# Patient Record
Sex: Female | Born: 1935 | Race: Black or African American | Hispanic: No | Smoking: Never smoker
Health system: Southern US, Community
[De-identification: ages and names within clinical notes are randomized; demographics above are authoritative.]

## PROBLEM LIST (undated history)

## (undated) DIAGNOSIS — R42 Dizziness and giddiness: Secondary | ICD-10-CM

## (undated) DIAGNOSIS — M1990 Unspecified osteoarthritis, unspecified site: Secondary | ICD-10-CM

## (undated) DIAGNOSIS — Z972 Presence of dental prosthetic device (complete) (partial): Secondary | ICD-10-CM

## (undated) DIAGNOSIS — I10 Essential (primary) hypertension: Secondary | ICD-10-CM

## (undated) DIAGNOSIS — M5430 Sciatica, unspecified side: Secondary | ICD-10-CM

## (undated) DIAGNOSIS — R7303 Prediabetes: Secondary | ICD-10-CM

## (undated) HISTORY — PX: COLONOSCOPY: SHX174

## (undated) HISTORY — PX: OOPHORECTOMY: SHX86

## (undated) HISTORY — PX: ABDOMINAL HYSTERECTOMY: SHX81

## (undated) HISTORY — PX: APPENDECTOMY: SHX54

## (undated) HISTORY — PX: CHOLECYSTECTOMY: SHX55

## (undated) HISTORY — PX: TONSILLECTOMY: SUR1361

---

## 2005-05-23 ENCOUNTER — Emergency Department: Payer: Self-pay | Admitting: Emergency Medicine

## 2007-09-10 IMAGING — CT CT ABD-PELV W/O CM
1 of 2 series · 15 of 32 positions shown, 19 images · non-contrast
Comparison: none

REASON FOR EXAM: Abdominal pain. Stone study. [HOSPITAL]
COMMENTS:

PROCEDURE:     CT  - CT ABDOMEN AND PELVIS W[DATE] [DATE]
RESULT:
HISTORY: Abdominal pain.
COMPARISON STUDIES:  None.

[Series 2: stone · axial · 0.78mm/px · z∈[-422,-22]mm · 15 of 150 slices shown, 19 images]
[im 11/150  soft-tissue]
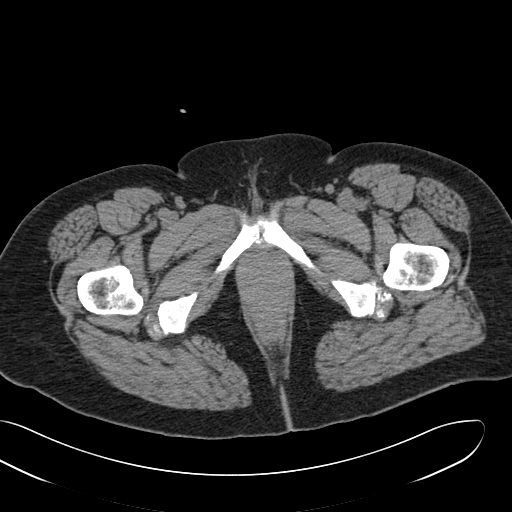
[im 11/150  bone]
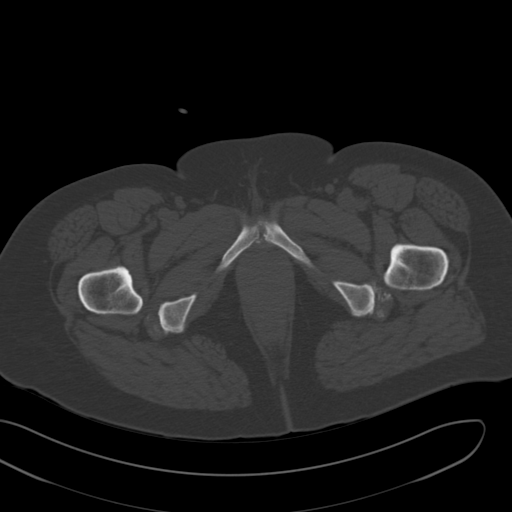
[im 22/150  soft-tissue]
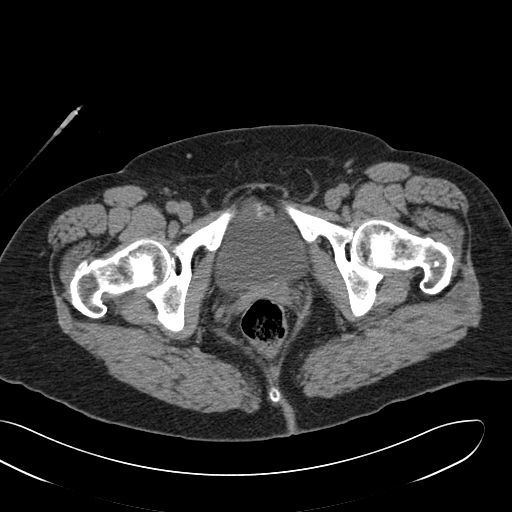
[im 32/150  soft-tissue]
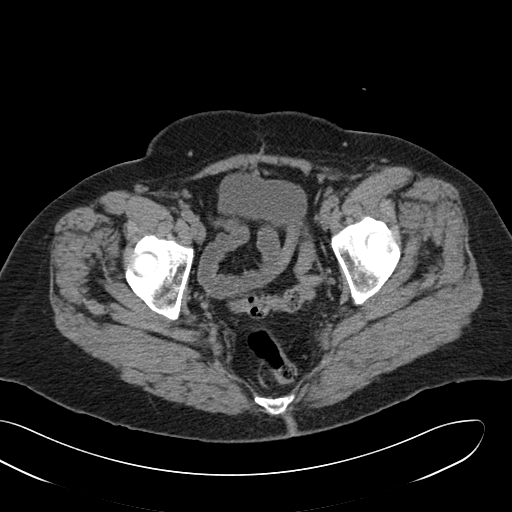
[im 43/150  soft-tissue]
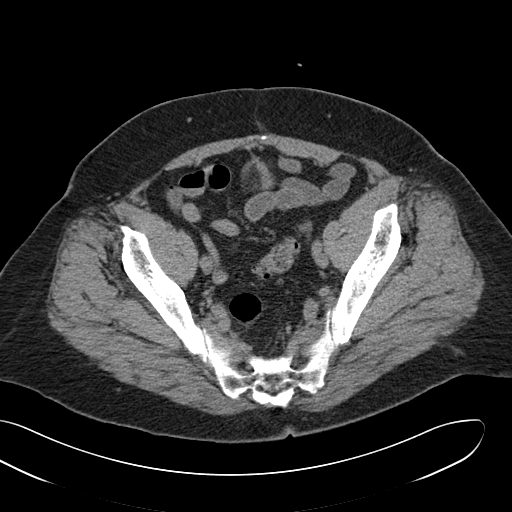
[im 54/150  soft-tissue]
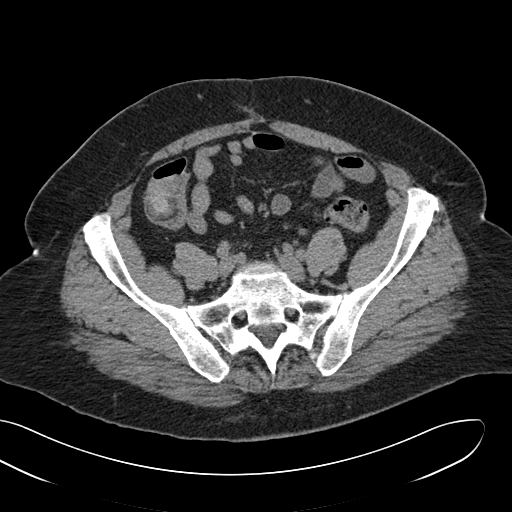
[im 64/150  soft-tissue]
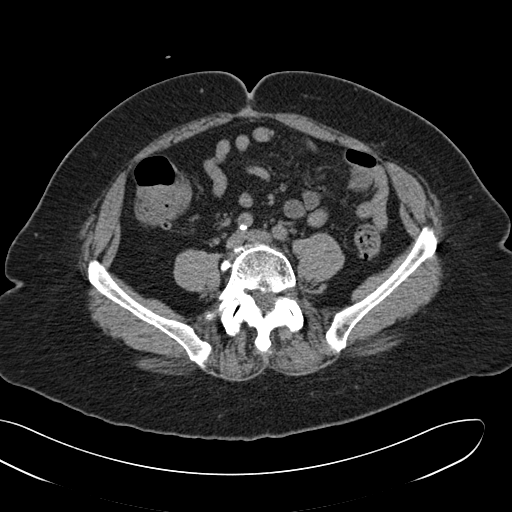
[im 75/150  soft-tissue]
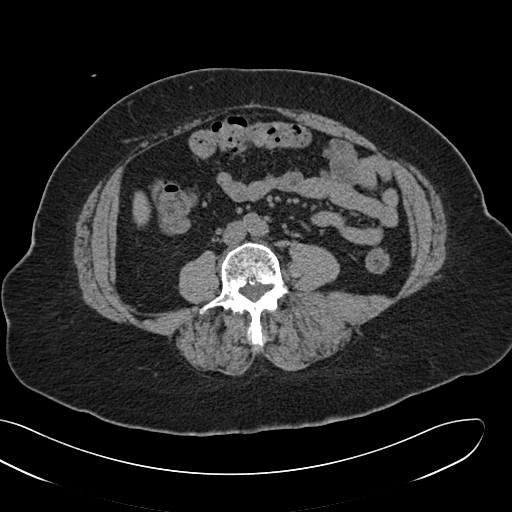
[im 86/150  soft-tissue]
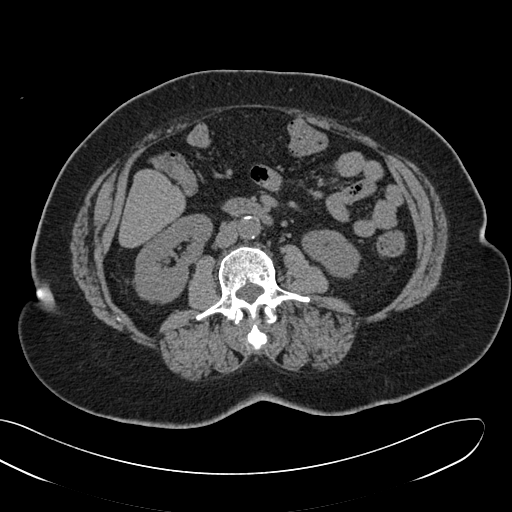
[im 96/150  soft-tissue]
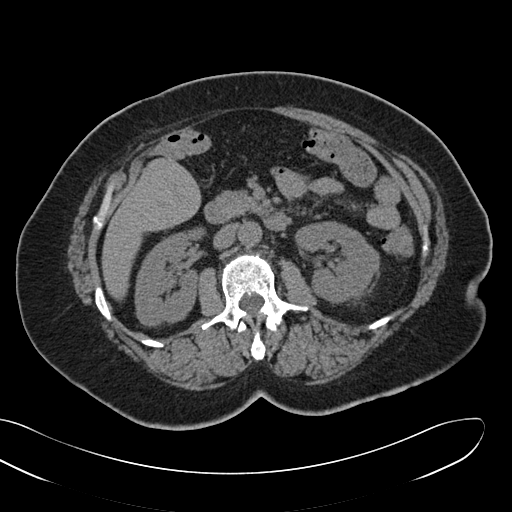
[im 96/150  bone]
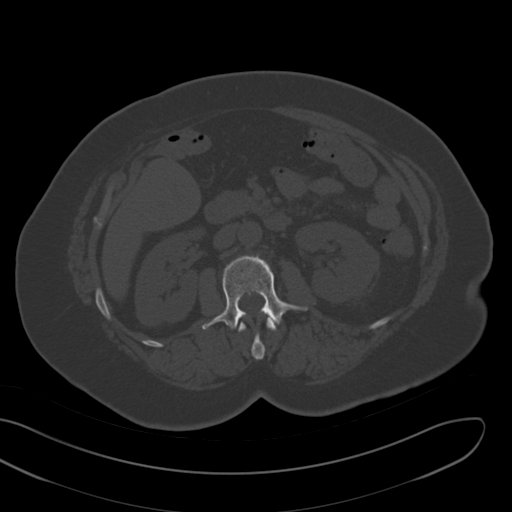
[im 107/150  soft-tissue]
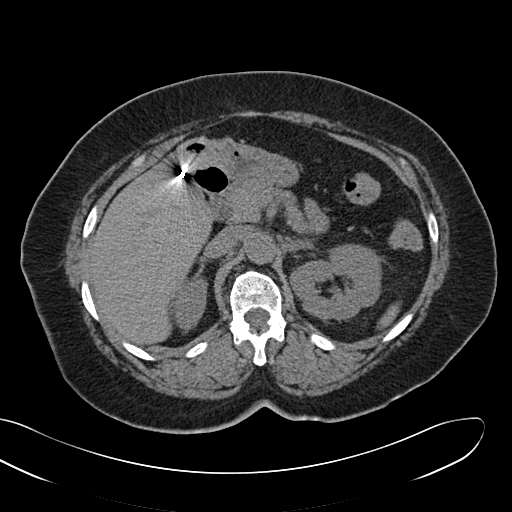
[im 118/150  soft-tissue]
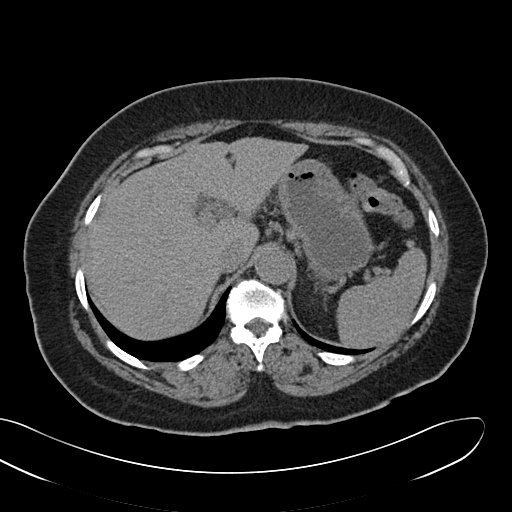
[im 128/150  soft-tissue]
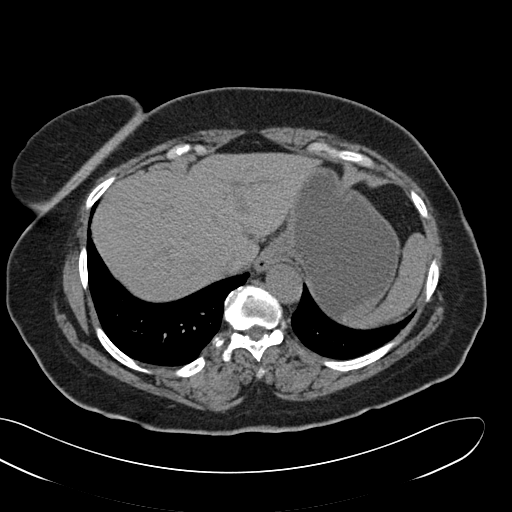
[im 128/150  lung]
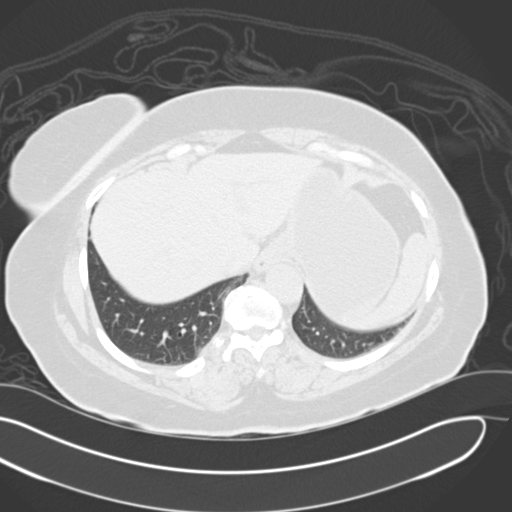
[im 134/150  lung]
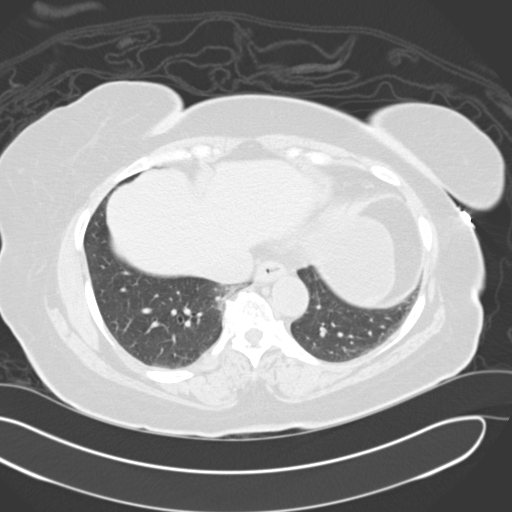
[im 139/150  soft-tissue]
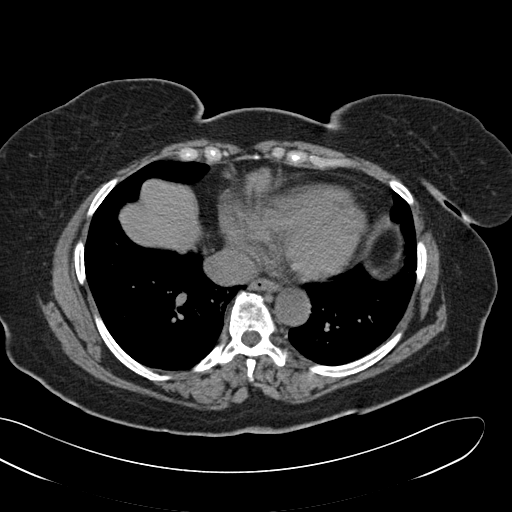
[im 139/150  lung]
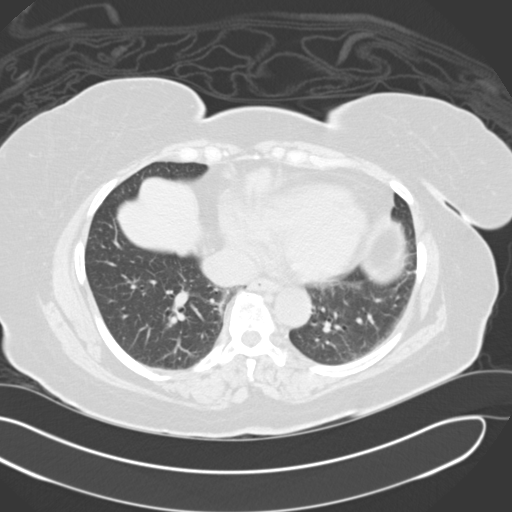
[im 144/150  lung]
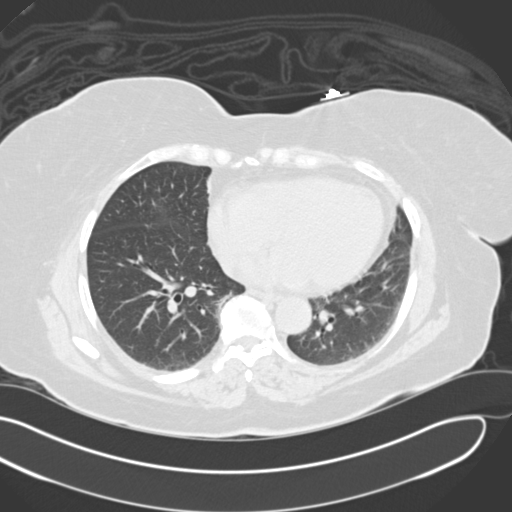

[15 of 32 positions shown; findings below may reference images not displayed]

FINDINGS: Liver and spleen are normal.  The adrenals are normal.  The
pancreas is normal.  Surgical clips are noted in the gallbladder fossa.  No
focal renal abnormalities are identified.  No bowel distention is noted.
Innumerable calcified pelvic densities are noted most consistent with
phleboliths.  A non-obstructing very tiny distal ureteral stone cannot be
excluded.  The bladder and inguinal regions are normal.  LEFT base
subsegmental atelectasis is noted.  Pelvic clips are noted.  No free fluid
is noted in the pelvis.

Soft tissue density is noted in the cecum. Although this could represent
stool, it may be useful to perform a barium enema or colonoscopy to rule out
a cecal mass.
IMPRESSION: No acute abnormalities identified.

Soft tissue density measuring approximately 2 cm noted in the cecum.
Although this could represent stool, it may be wise to perform barium enema
or colonoscopy to exclude a cecal mass.  The initial report was faxed to the
emergency room at the time of the study.  This report was phoned to the
emergency room physician at 8 a.m. on 05/24/05.

## 2009-06-20 ENCOUNTER — Emergency Department: Payer: Self-pay | Admitting: Emergency Medicine

## 2012-05-06 ENCOUNTER — Emergency Department: Payer: Self-pay | Admitting: Emergency Medicine

## 2012-07-07 ENCOUNTER — Ambulatory Visit: Payer: Self-pay | Admitting: General Practice

## 2015-06-06 ENCOUNTER — Other Ambulatory Visit: Payer: Self-pay

## 2015-11-04 ENCOUNTER — Encounter: Payer: Self-pay | Admitting: *Deleted

## 2015-11-04 NOTE — Discharge Instructions (Signed)

## 2015-11-09 ENCOUNTER — Ambulatory Visit: Payer: Medicare Other | Admitting: Student in an Organized Health Care Education/Training Program

## 2015-11-09 ENCOUNTER — Ambulatory Visit
Admission: RE | Admit: 2015-11-09 | Discharge: 2015-11-09 | Disposition: A | Discharge status: Home / Self Care | Payer: Medicare Other | Source: Ambulatory Visit | Attending: Ophthalmology | Admitting: Ophthalmology

## 2015-11-09 ENCOUNTER — Encounter
Admission: RE | Disposition: A | Discharge status: Home / Self Care | Payer: Self-pay | Source: Ambulatory Visit | Attending: Ophthalmology

## 2015-11-09 DIAGNOSIS — I10 Essential (primary) hypertension: Secondary | ICD-10-CM | POA: Insufficient documentation

## 2015-11-09 DIAGNOSIS — H2511 Age-related nuclear cataract, right eye: Secondary | ICD-10-CM | POA: Diagnosis present

## 2015-11-09 HISTORY — DX: Unspecified osteoarthritis, unspecified site: M19.90

## 2015-11-09 HISTORY — DX: Dizziness and giddiness: R42

## 2015-11-09 HISTORY — DX: Sciatica, unspecified side: M54.30

## 2015-11-09 HISTORY — PX: CATARACT EXTRACTION W/PHACO: SHX586

## 2015-11-09 HISTORY — DX: Essential (primary) hypertension: I10

## 2015-11-09 SURGERY — PHACOEMULSIFICATION, CATARACT, WITH IOL INSERTION
Anesthesia: Monitor Anesthesia Care | Site: Eye | Laterality: Right | Wound class: Clean

## 2015-11-09 MED ORDER — POVIDONE-IODINE 5 % OP SOLN
1.0000 "application " | OPHTHALMIC | Status: DC | PRN
  Administered 2015-11-09: 1 via OPHTHALMIC

## 2015-11-09 MED ORDER — LIDOCAINE HCL (PF) 4 % IJ SOLN
INTRAOCULAR | Status: DC | PRN
  Administered 2015-11-09: 1 mL via OPHTHALMIC

## 2015-11-09 MED ORDER — LACTATED RINGERS IV SOLN: INTRAVENOUS | Status: DC

## 2015-11-09 MED ORDER — NA HYALUR & NA CHOND-NA HYALUR 0.4-0.35 ML IO KIT
PACK | INTRAOCULAR | Status: DC | PRN
  Administered 2015-11-09: 1 mL via INTRAOCULAR

## 2015-11-09 MED ORDER — TIMOLOL MALEATE 0.5 % OP SOLN
OPHTHALMIC | Status: DC | PRN
  Administered 2015-11-09: 1 [drp] via OPHTHALMIC

## 2015-11-09 MED ORDER — EPINEPHRINE HCL 1 MG/ML IJ SOLN
INTRAMUSCULAR | Status: DC | PRN
  Administered 2015-11-09: 10:00:00 via OPHTHALMIC
  Administered 2015-11-09: 58 mL via OPHTHALMIC

## 2015-11-09 MED ORDER — FENTANYL CITRATE (PF) 100 MCG/2ML IJ SOLN
INTRAMUSCULAR | Status: DC | PRN
  Administered 2015-11-09: 50 ug via INTRAVENOUS

## 2015-11-09 MED ORDER — TETRACAINE HCL 0.5 % OP SOLN
1.0000 [drp] | OPHTHALMIC | Status: DC | PRN
  Administered 2015-11-09: 1 [drp] via OPHTHALMIC

## 2015-11-09 MED ORDER — CEFUROXIME OPHTHALMIC INJECTION 1 MG/0.1 ML
INJECTION | OPHTHALMIC | Status: DC | PRN
  Administered 2015-11-09: 0.1 mL via INTRACAMERAL

## 2015-11-09 MED ORDER — MIDAZOLAM HCL 2 MG/2ML IJ SOLN
INTRAMUSCULAR | Status: DC | PRN
  Administered 2015-11-09: 2 mg via INTRAVENOUS

## 2015-11-09 MED ORDER — BRIMONIDINE TARTRATE 0.2 % OP SOLN
OPHTHALMIC | Status: DC | PRN
  Administered 2015-11-09: 1 [drp] via OPHTHALMIC

## 2015-11-09 MED ORDER — ARMC OPHTHALMIC DILATING GEL
1.0000 "application " | OPHTHALMIC | Status: DC | PRN
  Administered 2015-11-09 (×2): 1 via OPHTHALMIC

## 2015-11-09 MED ORDER — LACTATED RINGERS IV SOLN: 500.0000 mL | INTRAVENOUS | Status: DC

## 2015-11-09 SURGICAL SUPPLY — 25 items
CANNULA ANT/CHMB 27GA (MISCELLANEOUS) ×3 IMPLANT
CARTRIDGE ABBOTT (MISCELLANEOUS) IMPLANT
GLOVE SURG LX 7.5 STRW (GLOVE) ×2
GLOVE SURG LX STRL 7.5 STRW (GLOVE) ×1 IMPLANT
GLOVE SURG TRIUMPH 8.0 PF LTX (GLOVE) ×3 IMPLANT
GOWN STRL REUS W/ TWL LRG LVL3 (GOWN DISPOSABLE) ×2 IMPLANT
GOWN STRL REUS W/TWL LRG LVL3 (GOWN DISPOSABLE) ×4
LENS IOL TECNIS 5.0 (Intraocular Lens) ×3 IMPLANT
MARKER SKIN DUAL TIP RULER LAB (MISCELLANEOUS) ×3 IMPLANT
NDL RETROBULBAR .5 NSTRL (NEEDLE) IMPLANT
NEEDLE FILTER BLUNT 18X 1/2SAF (NEEDLE) ×2
NEEDLE FILTER BLUNT 18X1 1/2 (NEEDLE) ×1 IMPLANT
PACK CATARACT BRASINGTON (MISCELLANEOUS) ×3 IMPLANT
PACK EYE AFTER SURG (MISCELLANEOUS) ×3 IMPLANT
PACK OPTHALMIC (MISCELLANEOUS) ×3 IMPLANT
RING MALYGIN 7.0 (MISCELLANEOUS) IMPLANT
SUT ETHILON 10-0 CS-B-6CS-B-6 (SUTURE)
SUT VICRYL  9 0 (SUTURE)
SUT VICRYL 9 0 (SUTURE) IMPLANT
SUTURE EHLN 10-0 CS-B-6CS-B-6 (SUTURE) IMPLANT
SYR 3ML LL SCALE MARK (SYRINGE) ×3 IMPLANT
SYR 5ML LL (SYRINGE) ×3 IMPLANT
SYR TB 1ML LUER SLIP (SYRINGE) ×3 IMPLANT
WATER STERILE IRR 250ML POUR (IV SOLUTION) ×3 IMPLANT
WIPE NON LINTING 3.25X3.25 (MISCELLANEOUS) ×3 IMPLANT

## 2015-11-09 NOTE — H&P (Signed)
  The History and Physical notes are on paper, have been signed, and are to be scanned. The patient remains stable and unchanged from the H&P.   Previous H&P reviewed, patient examined, and there are no changes.  Cyan Clippinger 11/09/2015 8:51 AM

## 2015-11-09 NOTE — Transfer of Care (Signed)
Immediate Anesthesia Transfer of Care Note  Patient: Tammy Hendricks  Procedure(s) Performed: Procedure(s): CATARACT EXTRACTION PHACO AND INTRAOCULAR LENS PLACEMENT (IOC) right eye (Right)  Patient Location: PACU  Anesthesia Type: MAC  Level of Consciousness: awake, alert  and patient cooperative  Airway and Oxygen Therapy: Patient Spontanous Breathing and Patient connected to supplemental oxygen  Post-op Assessment: Post-op Vital signs reviewed, Patient's Cardiovascular Status Stable, Respiratory Function Stable, Patent Airway and No signs of Nausea or vomiting  Post-op Vital Signs: Reviewed and stable  Complications: No apparent anesthesia complications

## 2015-11-09 NOTE — Anesthesia Preprocedure Evaluation (Addendum)
Anesthesia Evaluation  Patient identified by MRN, date of birth, ID band Patient awake    Reviewed: Allergy & Precautions, H&P , NPO status , Patient's Chart, lab work & pertinent test results, reviewed documented beta blocker date and time   Airway Mallampati: III  TM Distance: >3 FB Neck ROM: full    Dental  (+) Upper Dentures, Lower Dentures   Pulmonary neg pulmonary ROS,    Pulmonary exam normal breath sounds clear to auscultation       Cardiovascular Exercise Tolerance: Good hypertension, On Medications  Rhythm:regular Rate:Normal     Neuro/Psych negative neurological ROS  negative psych ROS   GI/Hepatic negative GI ROS, Neg liver ROS,   Endo/Other  negative endocrine ROS  Renal/GU negative Renal ROS  negative genitourinary   Musculoskeletal   Abdominal   Peds  Hematology negative hematology ROS (+)   Anesthesia Other Findings   Reproductive/Obstetrics negative OB ROS                            Anesthesia Physical Anesthesia Plan  ASA: II  Anesthesia Plan: MAC   Post-op Pain Management:    Induction:   Airway Management Planned:   Additional Equipment:   Intra-op Plan:   Post-operative Plan:   Informed Consent: I have reviewed the patients History and Physical, chart, labs and discussed the procedure including the risks, benefits and alternatives for the proposed anesthesia with the patient or authorized representative who has indicated his/her understanding and acceptance.     Plan Discussed with: CRNA  Anesthesia Plan Comments:         Anesthesia Quick Evaluation

## 2015-11-09 NOTE — Op Note (Signed)
LOCATION:  [facility]   PREOPERATIVE DIAGNOSIS:  Nuclear sclerotic cataract of the right eye.  H25.11   POSTOPERATIVE DIAGNOSIS:  Nuclear sclerotic cataract of the right eye.   PROCEDURE:  Phacoemulsification with Toric posterior chamber intraocular lens placement of the right eye.   LENS:   Implant Name Type Inv. Item Serial No. Manufacturer Lot No. LRB No. Used  LENS IOL TECNIS 5.0 - [id number] Intraocular Lens LENS IOL TECNIS 5.0 [id number] AMO   Right 1     ZCT150 21.5 D  Toric intraocular lens with 1.5 diopters of cylindrical power with axis orientation at 160 degrees.   ULTRASOUND TIME: 20 % of 1 minutes, 9 seconds.  CDE 13.5   SURGEON:  Deirdre Evenerhadwick R. Jameshia Hayashida, MD   ANESTHESIA: Topical with tetracaine drops and 2% Xylocaine jelly, augmented with 1% preservative-free intracameral lidocaine. .   COMPLICATIONS:  None.   DESCRIPTION OF PROCEDURE:  The patient was identified in the holding room and transported to the operating suite and placed in the supine position under the operating microscope.  The right eye was identified as the operative eye, and it was prepped and draped in the usual sterile ophthalmic fashion.    A clear-corneal paracentesis incision was made at the 12:00 position.  0.5 ml of preservative-free 1% lidocaine was injected into the anterior chamber. The anterior chamber was filled with Viscoat.  A 2.4 millimeter near clear corneal incision was then made at the 9:00 position.  A cystotome and capsulorrhexis forceps were then used to make a curvilinear capsulorrhexis.  Hydrodissection and hydrodelineation were then performed using balanced salt solution.   Phacoemulsification was then used in stop and chop fashion to remove the lens, nucleus and epinucleus.  The remaining cortex was aspirated using the irrigation and aspiration handpiece.  Provisc viscoelastic was then placed into the capsular bag to distend it for lens placement.  The Verion digital marker  was used to align the implant at the intended axis.   A Toric lens was then injected into the capsular bag.  It was rotated clockwise until the axis marks on the lens were approximately 15 degrees in the counterclockwise direction to the intended alignment.  The viscoelastic was aspirated from the eye using the irrigation aspiration handpiece.  Then, a Koch spatula through the sideport incision was used to rotate the lens in a clockwise direction until the axis markings of the intraocular lens were lined up with the Verion alignment.  Balanced salt solution was then used to hydrate the wounds. Cefuroxime 0.1 ml of a 10mg /ml solution was injected into the anterior chamber for a dose of 1 mg of intracameral antibiotic at the completion of the case.    The eye was noted to have a physiologic pressure and there was no wound leak noted.   Timolol and Brimonidine drops were applied to the eye.  The patient was taken to the recovery room in stable condition having had no complications of anesthesia or surgery.  Tammy Hendricks 11/09/2015, 9:42 AM

## 2015-11-09 NOTE — Anesthesia Postprocedure Evaluation (Signed)
Anesthesia Post Note  Patient: Tammy Hendricks  Procedure(s) Performed: Procedure(s) (LRB): CATARACT EXTRACTION PHACO AND INTRAOCULAR LENS PLACEMENT (IOC) right eye (Right)  Patient location during evaluation: PACU Anesthesia Type: MAC Level of consciousness: awake and alert Pain management: pain level controlled Vital Signs Assessment: post-procedure vital signs reviewed and stable Respiratory status: spontaneous breathing, nonlabored ventilation and respiratory function stable Cardiovascular status: blood pressure returned to baseline and stable Postop Assessment: no signs of nausea or vomiting Anesthetic complications: no    DANIEL D KOVACS

## 2015-11-09 NOTE — Anesthesia Procedure Notes (Signed)
Procedure Name: MAC Performed by: Zackarey Holleman Pre-anesthesia Checklist: Patient identified, Emergency Drugs available, Suction available, Timeout performed and Patient being monitored Patient Re-evaluated:Patient Re-evaluated prior to inductionOxygen Delivery Method: Nasal cannula Placement Confirmation: positive ETCO2       

## 2015-11-10 ENCOUNTER — Encounter: Payer: Self-pay | Admitting: Ophthalmology

## 2016-02-03 ENCOUNTER — Encounter: Payer: Self-pay | Admitting: Emergency Medicine

## 2016-02-03 ENCOUNTER — Emergency Department
Admission: EM | Admit: 2016-02-03 | Discharge: 2016-02-06 | Disposition: A | Discharge status: Home / Self Care | Payer: Medicare Other | Attending: Emergency Medicine | Admitting: Emergency Medicine

## 2016-02-03 DIAGNOSIS — Z5321 Procedure and treatment not carried out due to patient leaving prior to being seen by health care provider: Secondary | ICD-10-CM | POA: Insufficient documentation

## 2016-02-03 DIAGNOSIS — R200 Anesthesia of skin: Secondary | ICD-10-CM | POA: Diagnosis not present

## 2016-02-03 DIAGNOSIS — Z79899 Other long term (current) drug therapy: Secondary | ICD-10-CM | POA: Diagnosis not present

## 2016-02-03 DIAGNOSIS — Z7982 Long term (current) use of aspirin: Secondary | ICD-10-CM | POA: Insufficient documentation

## 2016-02-03 DIAGNOSIS — I10 Essential (primary) hypertension: Secondary | ICD-10-CM | POA: Diagnosis not present

## 2016-02-03 NOTE — ED Triage Notes (Signed)
Pt reports left leg numbness since Wednesday.

## 2017-11-21 ENCOUNTER — Encounter: Payer: Self-pay | Admitting: *Deleted

## 2017-11-21 ENCOUNTER — Other Ambulatory Visit: Payer: Self-pay

## 2017-11-29 NOTE — Discharge Instructions (Signed)

## 2017-12-03 ENCOUNTER — Ambulatory Visit: Payer: Medicare Other | Admitting: Anesthesiology

## 2017-12-03 ENCOUNTER — Ambulatory Visit
Admission: RE | Admit: 2017-12-03 | Discharge: 2017-12-03 | Disposition: A | Discharge status: Home / Self Care | Payer: Medicare Other | Source: Ambulatory Visit | Attending: Ophthalmology | Admitting: Ophthalmology

## 2017-12-03 ENCOUNTER — Encounter
Admission: RE | Disposition: A | Discharge status: Home / Self Care | Payer: Self-pay | Source: Ambulatory Visit | Attending: Ophthalmology

## 2017-12-03 DIAGNOSIS — E1136 Type 2 diabetes mellitus with diabetic cataract: Secondary | ICD-10-CM | POA: Insufficient documentation

## 2017-12-03 DIAGNOSIS — I10 Essential (primary) hypertension: Secondary | ICD-10-CM | POA: Diagnosis not present

## 2017-12-03 DIAGNOSIS — M1990 Unspecified osteoarthritis, unspecified site: Secondary | ICD-10-CM | POA: Diagnosis not present

## 2017-12-03 DIAGNOSIS — H2512 Age-related nuclear cataract, left eye: Secondary | ICD-10-CM | POA: Insufficient documentation

## 2017-12-03 HISTORY — PX: CATARACT EXTRACTION W/PHACO: SHX586

## 2017-12-03 HISTORY — DX: Prediabetes: R73.03

## 2017-12-03 HISTORY — DX: Presence of dental prosthetic device (complete) (partial): Z97.2

## 2017-12-03 SURGERY — PHACOEMULSIFICATION, CATARACT, WITH IOL INSERTION
Anesthesia: Monitor Anesthesia Care | Site: Eye | Laterality: Left | Wound class: "Clean "

## 2017-12-03 MED ORDER — LACTATED RINGERS IV SOLN: INTRAVENOUS | Status: DC

## 2017-12-03 MED ORDER — FENTANYL CITRATE (PF) 100 MCG/2ML IJ SOLN
INTRAMUSCULAR | Status: DC | PRN
  Administered 2017-12-03: 25 ug via INTRAVENOUS

## 2017-12-03 MED ORDER — MIDAZOLAM HCL 2 MG/2ML IJ SOLN
INTRAMUSCULAR | Status: DC | PRN
  Administered 2017-12-03: 1 mg via INTRAVENOUS

## 2017-12-03 MED ORDER — LIDOCAINE HCL (PF) 2 % IJ SOLN
INTRAMUSCULAR | Status: DC | PRN
  Administered 2017-12-03: 1 mL

## 2017-12-03 MED ORDER — CEFUROXIME OPHTHALMIC INJECTION 1 MG/0.1 ML
INJECTION | OPHTHALMIC | Status: DC | PRN
  Administered 2017-12-03: 0.1 mL via OPHTHALMIC

## 2017-12-03 MED ORDER — BRIMONIDINE TARTRATE-TIMOLOL 0.2-0.5 % OP SOLN
OPHTHALMIC | Status: DC | PRN
  Administered 2017-12-03: 1 [drp] via OPHTHALMIC

## 2017-12-03 MED ORDER — NA HYALUR & NA CHOND-NA HYALUR 0.4-0.35 ML IO KIT
PACK | INTRAOCULAR | Status: DC | PRN
  Administered 2017-12-03: 1 mL via INTRAOCULAR

## 2017-12-03 MED ORDER — EPINEPHRINE PF 1 MG/ML IJ SOLN
INTRAOCULAR | Status: DC | PRN
  Administered 2017-12-03: 70 mL via OPHTHALMIC

## 2017-12-03 MED ORDER — ARMC OPHTHALMIC DILATING DROPS
1.0000 "application " | OPHTHALMIC | Status: DC | PRN
  Administered 2017-12-03 (×3): 1 via OPHTHALMIC

## 2017-12-03 MED ORDER — MOXIFLOXACIN HCL 0.5 % OP SOLN
1.0000 [drp] | OPHTHALMIC | Status: DC | PRN
  Administered 2017-12-03 (×3): 1 [drp] via OPHTHALMIC

## 2017-12-03 SURGICAL SUPPLY — 27 items
CANNULA ANT/CHMB 27G (MISCELLANEOUS) ×1 IMPLANT
CANNULA ANT/CHMB 27GA (MISCELLANEOUS) ×3 IMPLANT
CARTRIDGE ABBOTT (MISCELLANEOUS) IMPLANT
GLOVE SURG LX 7.5 STRW (GLOVE) ×2
GLOVE SURG LX STRL 7.5 STRW (GLOVE) ×1 IMPLANT
GLOVE SURG TRIUMPH 8.0 PF LTX (GLOVE) ×3 IMPLANT
GOWN STRL REUS W/ TWL LRG LVL3 (GOWN DISPOSABLE) ×2 IMPLANT
GOWN STRL REUS W/TWL LRG LVL3 (GOWN DISPOSABLE) ×4
LENS IOL TECNIS ITEC 21.5 (Intraocular Lens) ×2 IMPLANT
MARKER SKIN DUAL TIP RULER LAB (MISCELLANEOUS) ×3 IMPLANT
NDL FILTER BLUNT 18X1 1/2 (NEEDLE) ×1 IMPLANT
NDL RETROBULBAR .5 NSTRL (NEEDLE) IMPLANT
NEEDLE FILTER BLUNT 18X 1/2SAF (NEEDLE) ×2
NEEDLE FILTER BLUNT 18X1 1/2 (NEEDLE) ×1 IMPLANT
PACK CATARACT BRASINGTON (MISCELLANEOUS) ×3 IMPLANT
PACK EYE AFTER SURG (MISCELLANEOUS) ×3 IMPLANT
PACK OPTHALMIC (MISCELLANEOUS) ×3 IMPLANT
RING MALYGIN 7.0 (MISCELLANEOUS) IMPLANT
SUT ETHILON 10-0 CS-B-6CS-B-6 (SUTURE)
SUT VICRYL  9 0 (SUTURE)
SUT VICRYL 9 0 (SUTURE) IMPLANT
SUTURE EHLN 10-0 CS-B-6CS-B-6 (SUTURE) IMPLANT
SYR 3ML LL SCALE MARK (SYRINGE) ×3 IMPLANT
SYR 5ML LL (SYRINGE) ×3 IMPLANT
SYR TB 1ML LUER SLIP (SYRINGE) ×3 IMPLANT
WATER STERILE IRR 500ML POUR (IV SOLUTION) ×3 IMPLANT
WIPE NON LINTING 3.25X3.25 (MISCELLANEOUS) ×3 IMPLANT

## 2017-12-03 NOTE — H&P (Signed)
The History and Physical notes are on paper, have been signed, and are to be scanned. The patient remains stable and unchanged from the H&P.   Previous H&P reviewed, patient examined, and there are no changes.  Tammy Hendricks 12/03/2017 8:10 AM

## 2017-12-03 NOTE — Op Note (Signed)
OPERATIVE NOTE  Tammy LoseShirley M Hendricks [id number] 12/03/2017   PREOPERATIVE DIAGNOSIS:  Nuclear sclerotic cataract left eye. H25.12   POSTOPERATIVE DIAGNOSIS:    Nuclear sclerotic cataract left eye.     PROCEDURE:  Phacoemusification with posterior chamber intraocular lens placement of the left eye   LENS:   Implant Name Type Inv. Item Serial No. Manufacturer Lot No. LRB No. Used  LENS IOL DIOP 21.5 - [id number] Intraocular Lens LENS IOL DIOP 21.5 [id number]  Left 1        ULTRASOUND TIME: 15  % of 1 minutes 6 seconds, CDE 10.0  SURGEON:  Deirdre Evenerhadwick R. Kameren Baade, MD   ANESTHESIA:  Topical with tetracaine drops and 2% Xylocaine jelly, augmented with 1% preservative-free intracameral lidocaine.    COMPLICATIONS:  None.   DESCRIPTION OF PROCEDURE:  The patient was identified in the holding room and transported to the operating room and placed in the supine position under the operating microscope.  The left eye was identified as the operative eye and it was prepped and draped in the usual sterile ophthalmic fashion.   A 1 millimeter clear-corneal paracentesis was made at the 1:30 position.  0.5 ml of preservative-free 1% lidocaine was injected into the anterior chamber.  The anterior chamber was filled with Viscoat viscoelastic.  A 2.4 millimeter keratome was used to make a near-clear corneal incision at the 10:30 position.  .  A curvilinear capsulorrhexis was made with a cystotome and capsulorrhexis forceps.  Balanced salt solution was used to hydrodissect and hydrodelineate the nucleus.   Phacoemulsification was then used in stop and chop fashion to remove the lens nucleus and epinucleus.  The remaining cortex was then removed using the irrigation and aspiration handpiece. Provisc was then placed into the capsular bag to distend it for lens placement.  A lens was then injected into the capsular bag.  The remaining viscoelastic was aspirated.   Wounds were hydrated with balanced salt  solution.  The anterior chamber was inflated to a physiologic pressure with balanced salt solution.  No wound leaks were noted. Cefuroxime 0.1 ml of a 10mg /ml solution was injected into the anterior chamber for a dose of 1 mg of intracameral antibiotic at the completion of the case.   Timolol and Brimonidine drops were applied to the eye.  The patient was taken to the recovery room in stable condition without complications of anesthesia or surgery.  Hughie Melroy 12/03/2017, 9:01 AM

## 2017-12-03 NOTE — Anesthesia Postprocedure Evaluation (Signed)
Anesthesia Post Note  Patient: Tammy Hendricks  Procedure(s) Performed: CATARACT EXTRACTION PHACO AND INTRAOCULAR LENS PLACEMENT (IOC) LEFT EYE (Left Eye)  Patient location during evaluation: PACU Anesthesia Type: MAC Level of consciousness: awake and alert Pain management: pain level controlled Vital Signs Assessment: post-procedure vital signs reviewed and stable Respiratory status: spontaneous breathing, nonlabored ventilation, respiratory function stable and patient connected to nasal cannula oxygen Cardiovascular status: stable and blood pressure returned to baseline Postop Assessment: no apparent nausea or vomiting Anesthetic complications: no    Geniya Fulgham C

## 2017-12-03 NOTE — Transfer of Care (Signed)
Immediate Anesthesia Transfer of Care Note  Patient: Tammy Hendricks  Procedure(s) Performed: CATARACT EXTRACTION PHACO AND INTRAOCULAR LENS PLACEMENT (IOC) LEFT EYE (Left Eye)  Patient Location: PACU  Anesthesia Type: MAC  Level of Consciousness: awake, alert  and patient cooperative  Airway and Oxygen Therapy: Patient Spontanous Breathing and Patient connected to supplemental oxygen  Post-op Assessment: Post-op Vital signs reviewed, Patient's Cardiovascular Status Stable, Respiratory Function Stable, Patent Airway and No signs of Nausea or vomiting  Post-op Vital Signs: Reviewed and stable  Complications: No apparent anesthesia complications

## 2017-12-03 NOTE — Anesthesia Preprocedure Evaluation (Signed)
Anesthesia Evaluation  Patient identified by MRN, date of birth, ID band Patient awake    Reviewed: Allergy & Precautions, NPO status , Patient's Chart, lab work & pertinent test results  Airway Mallampati: II  TM Distance: >3 FB Neck ROM: Full    Dental no notable dental hx.    Pulmonary neg pulmonary ROS,    Pulmonary exam normal breath sounds clear to auscultation       Cardiovascular hypertension, Normal cardiovascular exam Rhythm:Regular Rate:Normal     Neuro/Psych negative neurological ROS  negative psych ROS   GI/Hepatic negative GI ROS, Neg liver ROS,   Endo/Other  negative endocrine ROS  Renal/GU negative Renal ROS  negative genitourinary   Musculoskeletal  (+) Arthritis ,   Abdominal   Peds negative pediatric ROS (+)  Hematology negative hematology ROS (+)   Anesthesia Other Findings   Reproductive/Obstetrics negative OB ROS                             Anesthesia Physical Anesthesia Plan  ASA: II  Anesthesia Plan: MAC   Post-op Pain Management:    Induction: Intravenous  PONV Risk Score and Plan:   Airway Management Planned: Natural Airway  Additional Equipment:   Intra-op Plan:   Post-operative Plan: Extubation in OR  Informed Consent: I have reviewed the patients History and Physical, chart, labs and discussed the procedure including the risks, benefits and alternatives for the proposed anesthesia with the patient or authorized representative who has indicated his/her understanding and acceptance.   Dental advisory given  Plan Discussed with: CRNA  Anesthesia Plan Comments:         Anesthesia Quick Evaluation

## 2017-12-04 ENCOUNTER — Encounter: Payer: Self-pay | Admitting: Ophthalmology

## 2020-03-19 ENCOUNTER — Other Ambulatory Visit: Payer: Self-pay

## 2020-03-19 ENCOUNTER — Ambulatory Visit
Admission: EM | Admit: 2020-03-19 | Discharge: 2020-03-19 | Disposition: A | Discharge status: Home / Self Care | Payer: Medicare Other

## 2020-03-19 ENCOUNTER — Encounter: Payer: Self-pay | Admitting: Emergency Medicine

## 2020-03-19 DIAGNOSIS — I10 Essential (primary) hypertension: Secondary | ICD-10-CM

## 2020-03-19 DIAGNOSIS — T50905A Adverse effect of unspecified drugs, medicaments and biological substances, initial encounter: Secondary | ICD-10-CM

## 2020-03-19 MED ORDER — AMLODIPINE BESYLATE 2.5 MG PO TABS: 2.5000 mg | ORAL_TABLET | Freq: Every day | ORAL | 0 refills | Status: DC

## 2020-03-19 MED ORDER — HYDROCHLOROTHIAZIDE 25 MG PO TABS: 25.0000 mg | ORAL_TABLET | Freq: Every day | ORAL | Status: DC

## 2020-03-19 NOTE — ED Triage Notes (Signed)
Patient states she was started on Lisinpril-HCTZ on 03/04/20.  Patient states that she was previously just on HCTZ but her PCP changed her medication at her last visit.  Patient states that since starting the medication her blood pressure has been running low and has been off and on dizzy and light headed feeling.

## 2020-03-19 NOTE — Discharge Instructions (Addendum)
You were seen for dizziness and high blood pressure and are being treated for the same.   Take your prescribed medications as directed.  Make sure you follow-up with your primary care provider on December 6 as scheduled.  Take care, Dr. Sharlet Salina, NP-c

## 2020-03-19 NOTE — ED Provider Notes (Signed)
[facility] - [place], [place]   Name: Tammy Hendricks DOB: 05-27-1935 MRN: [id number] CSN: [id number] PCP: Floreen Comber, MD  Arrival date and time:  03/19/20 1335  Chief Complaint:  Dizziness   NOTE: Prior to seeing the patient today, I have reviewed the triage nursing documentation and vital signs. Clinical staff has updated patient's PMH/PSHx, current medication list, and drug allergies/intolerances to ensure comprehensive history available to assist in medical decision making.   History:   HPI: Tammy Hendricks is a 84 y.o. female who presents today with complaints of syncope after a blood pressure medication change. Patient states her PCP started her on your blood pressure medication earlier this month. Since starting this new blood pressure medication, her blood pressures have been running lower than normal. Last Saturday (11/20), patient "blacked out" in her bathroom floor and was found down by her daughter. Patient did not seek further evaluation after this event. Patient states after she fell, she stopped taking her lisinopril-hydrochlorothiazide, and started taking just her hydrochlorothiazide. She is not on any other blood pressure medications besides the 25 mg HCTZ. She previously took amlodipine per her chart review, but she is unaware of her dosage and when she stopped that previous medication. Patient denies any chest pain, chest pressure, shortness of breath, or headaches. Since stopping the lisinopril-hydrochlorothiazide combination, she has not had any further syncopal episodes or dizzy episodes.   Past Medical History:  Diagnosis Date   Arthritis    right knee   Hypertension    Pre-diabetes    Sciatica    left side - occasional   Vertigo    sporadic   Wears dentures    partial upper, full lower    Past Surgical History:  Procedure Laterality Date   ABDOMINAL HYSTERECTOMY     APPENDECTOMY     CATARACT EXTRACTION W/PHACO Right  11/09/2015   Procedure: CATARACT EXTRACTION PHACO AND INTRAOCULAR LENS PLACEMENT (IOC) right eye;  Surgeon: Lockie Mola, MD;  Location[facility];  Service: Ophthalmology;  Laterality: Right;   CATARACT EXTRACTION W/PHACO Left 12/03/2017   Procedure: CATARACT EXTRACTION PHACO AND INTRAOCULAR LENS PLACEMENT (IOC) LEFT EYE;  Surgeon: Lockie Mola, MD;  Location: [facility];  Service: Ophthalmology;  Laterality: Left;   CHOLECYSTECTOMY     COLONOSCOPY     OOPHORECTOMY     TONSILLECTOMY      History reviewed. No pertinent family history.  Social History   Tobacco Use   Smoking status: Never Smoker   Smokeless tobacco: Never Used  Vaping Use   Vaping Use: Never used  Substance Use Topics   Alcohol use: No   Drug use: Not on file    There are no problems to display for this patient.   Home Medications:    Current Meds  Medication Sig   brimonidine (ALPHAGAN) 0.2 % ophthalmic solution Place 1 drop into both eyes 2 (two) times daily.   KRILL OIL PO Take by mouth daily.   latanoprost (XALATAN) 0.005 % ophthalmic solution Place 1 drop into both eyes at bedtime.   timolol (TIMOPTIC) 0.5 % ophthalmic solution Administer 1 drop to both eyes every morning.   [DISCONTINUED] timolol (BETIMOL) 0.5 % ophthalmic solution Place 1 drop into both eyes daily.    Allergies:   Patient has no known allergies.  Review of Systems (ROS): Review of Systems  Respiratory: Negative for chest tightness.   Cardiovascular: Negative for chest pain, palpitations and leg swelling.  Neurological: Positive for  dizziness, syncope and light-headedness. Negative for headaches.  All other systems reviewed and are negative.    Vital Signs: Today's Vitals   03/19/20 1350 03/19/20 1355 03/19/20 1456  BP:  (!) 162/84   Pulse:  67   Resp:  14   Temp:  98.1 F (36.7 C)   TempSrc:  Oral   SpO2:  98%   Weight: 190 lb (86.2 kg)    Height: 5\' 4"  (1.626 m)      PainSc: 0-No pain  0-No pain    Physical Exam: Physical Exam Vitals and nursing note reviewed.  Constitutional:      Appearance: Normal appearance.  Cardiovascular:     Rate and Rhythm: Normal rate and regular rhythm.     Pulses: Normal pulses.     Heart sounds: Normal heart sounds.  Pulmonary:     Breath sounds: Normal breath sounds.  Skin:    General: Skin is warm and dry.     Capillary Refill: Capillary refill takes less than 2 seconds.  Neurological:     General: No focal deficit present.     Mental Status: She is alert and oriented to person, place, and time.  Psychiatric:        Mood and Affect: Mood normal.        Behavior: Behavior normal.        Thought Content: Thought content normal.      Urgent Care Treatments / Results:   LABS: PLEASE NOTE: all labs that were ordered this encounter are listed, however only abnormal results are displayed. Labs Reviewed - No data to display  EKG: Sinus rhythm with possible premature atrial complexes. Ventricular rate 60 bpm.  RADIOLOGY: No results found.  PROCEDURES: Procedures  MEDICATIONS RECEIVED THIS VISIT: Medications - No data to display  PERTINENT CLINICAL COURSE NOTES/UPDATES:   Initial Impression / Assessment and Plan / Urgent Care Course:  Pertinent labs & imaging results that were available during my care of the patient were personally reviewed by me and considered in my medical decision making (see lab/imaging section of note for values and interpretations).  Tammy Hendricks is a 84 y.o. female who presents to [facility] today with complaints of dizziness, diagnosed with hypertension and adverse drug reaction, and treated as such with the medications below. NP and patient reviewed discharge instructions below during visit.   Patient is well appearing overall in clinic today. She does not appear to be in any acute distress. Presenting symptoms (see HPI) and exam as documented above.   I have  reviewed the follow up and strict return precautions for any new or worsening symptoms. Patient is aware of symptoms that would be deemed urgent/emergent, and would thus require further evaluation either here or in the emergency department. At the time of discharge, she verbalized understanding and consent with the discharge plan as it was reviewed with her. All questions were fielded by provider and/or clinic staff prior to patient discharge.    Final Clinical Impressions / Urgent Care Diagnoses:   Final diagnoses:  Essential hypertension  Adverse effect of drug, initial encounter    New Prescriptions:  [place] Controlled Substance Registry consulted? Not Applicable  Meds ordered this encounter  Medications   DISCONTD: amLODipine (NORVASC) 2.5 MG tablet    Sig: Take 1 tablet (2.5 mg total) by mouth daily.    Dispense:  30 tablet    Refill:  0   hydrochlorothiazide (HYDRODIURIL) 25 MG tablet    Sig: Take 1  tablet (25 mg total) by mouth daily.   amLODipine (NORVASC) 2.5 MG tablet    Sig: Take 1 tablet (2.5 mg total) by mouth daily.    Dispense:  30 tablet    Refill:  0      Discharge Instructions     You were seen for dizziness and high blood pressure and are being treated for the same.   Take your prescribed medications as directed.  Make sure you follow-up with your primary care provider on December 6 as scheduled.  Take care, Dr. Sharlet Salina, NP-c     Recommended Follow up Care:  Patient encouraged to follow up with the following provider within the specified time frame, or sooner as dictated by the severity of her symptoms. As always, she was instructed that for any urgent/emergent care needs, she should seek care either here or in the emergency department for more immediate evaluation.   Bailey Mech, DNP, NP-c    Bailey Mech, NP 03/19/20 1535

## 2020-07-29 DIAGNOSIS — H8113 Benign paroxysmal vertigo, bilateral: Secondary | ICD-10-CM | POA: Diagnosis not present

## 2020-07-29 DIAGNOSIS — E782 Mixed hyperlipidemia: Secondary | ICD-10-CM | POA: Diagnosis not present

## 2020-07-29 DIAGNOSIS — I10 Essential (primary) hypertension: Secondary | ICD-10-CM | POA: Diagnosis not present

## 2020-08-29 DIAGNOSIS — E782 Mixed hyperlipidemia: Secondary | ICD-10-CM | POA: Diagnosis not present

## 2020-08-29 DIAGNOSIS — H8113 Benign paroxysmal vertigo, bilateral: Secondary | ICD-10-CM | POA: Diagnosis not present

## 2020-08-29 DIAGNOSIS — I10 Essential (primary) hypertension: Secondary | ICD-10-CM | POA: Diagnosis not present

## 2020-09-22 DIAGNOSIS — Z1231 Encounter for screening mammogram for malignant neoplasm of breast: Secondary | ICD-10-CM | POA: Diagnosis not present

## 2020-09-29 DIAGNOSIS — E782 Mixed hyperlipidemia: Secondary | ICD-10-CM | POA: Diagnosis not present

## 2020-09-29 DIAGNOSIS — I10 Essential (primary) hypertension: Secondary | ICD-10-CM | POA: Diagnosis not present

## 2020-11-01 DIAGNOSIS — H401131 Primary open-angle glaucoma, bilateral, mild stage: Secondary | ICD-10-CM | POA: Diagnosis not present

## 2020-11-08 DIAGNOSIS — H401131 Primary open-angle glaucoma, bilateral, mild stage: Secondary | ICD-10-CM | POA: Diagnosis not present

## 2020-11-10 ENCOUNTER — Other Ambulatory Visit
Admission: RE | Admit: 2020-11-10 | Discharge: 2020-11-10 | Disposition: A | Discharge status: Home / Self Care | Payer: Medicare Other | Source: Ambulatory Visit | Attending: Ophthalmology | Admitting: Ophthalmology

## 2020-11-10 DIAGNOSIS — G453 Amaurosis fugax: Secondary | ICD-10-CM | POA: Diagnosis not present

## 2020-11-10 LAB — CBC
HCT: 35.6 % — ABNORMAL LOW (ref 36.0–46.0)
Hemoglobin: 11.5 g/dL — ABNORMAL LOW (ref 12.0–15.0)
MCH: 24.7 pg — ABNORMAL LOW (ref 26.0–34.0)
MCHC: 32.3 g/dL (ref 30.0–36.0)
MCV: 76.6 fL — ABNORMAL LOW (ref 80.0–100.0)
Platelets: 199 K/uL (ref 150–400)
RBC: 4.65 MIL/uL (ref 3.87–5.11)
RDW: 14.6 % (ref 11.5–15.5)
WBC: 4.5 K/uL (ref 4.0–10.5)
nRBC: 0 % (ref 0.0–0.2)

## 2020-11-10 LAB — SEDIMENTATION RATE: Sed Rate: 45 mm/hr — ABNORMAL HIGH (ref 0–30)

## 2020-11-11 LAB — C-REACTIVE PROTEIN: CRP: 0.9 mg/dL (ref <1.0)

## 2020-11-29 DIAGNOSIS — E782 Mixed hyperlipidemia: Secondary | ICD-10-CM | POA: Diagnosis not present

## 2020-11-29 DIAGNOSIS — R7302 Impaired glucose tolerance (oral): Secondary | ICD-10-CM | POA: Diagnosis not present

## 2020-11-29 DIAGNOSIS — I10 Essential (primary) hypertension: Secondary | ICD-10-CM | POA: Diagnosis not present

## 2021-02-28 DIAGNOSIS — R7302 Impaired glucose tolerance (oral): Secondary | ICD-10-CM | POA: Diagnosis not present

## 2021-02-28 DIAGNOSIS — I10 Essential (primary) hypertension: Secondary | ICD-10-CM | POA: Diagnosis not present

## 2021-02-28 DIAGNOSIS — E782 Mixed hyperlipidemia: Secondary | ICD-10-CM | POA: Diagnosis not present

## 2021-03-14 DIAGNOSIS — I10 Essential (primary) hypertension: Secondary | ICD-10-CM | POA: Diagnosis not present

## 2021-03-14 DIAGNOSIS — E782 Mixed hyperlipidemia: Secondary | ICD-10-CM | POA: Diagnosis not present

## 2021-03-14 DIAGNOSIS — H8113 Benign paroxysmal vertigo, bilateral: Secondary | ICD-10-CM | POA: Diagnosis not present

## 2021-04-06 ENCOUNTER — Encounter: Payer: Self-pay | Admitting: Emergency Medicine

## 2021-04-06 DIAGNOSIS — R069 Unspecified abnormalities of breathing: Secondary | ICD-10-CM | POA: Diagnosis not present

## 2021-04-06 DIAGNOSIS — Z743 Need for continuous supervision: Secondary | ICD-10-CM | POA: Diagnosis not present

## 2021-04-06 DIAGNOSIS — R0689 Other abnormalities of breathing: Secondary | ICD-10-CM | POA: Diagnosis not present

## 2021-05-08 DIAGNOSIS — H401131 Primary open-angle glaucoma, bilateral, mild stage: Secondary | ICD-10-CM | POA: Diagnosis not present

## 2021-05-21 DIAGNOSIS — E782 Mixed hyperlipidemia: Secondary | ICD-10-CM | POA: Diagnosis not present

## 2021-05-21 DIAGNOSIS — H8113 Benign paroxysmal vertigo, bilateral: Secondary | ICD-10-CM | POA: Diagnosis not present

## 2021-05-21 DIAGNOSIS — I10 Essential (primary) hypertension: Secondary | ICD-10-CM | POA: Diagnosis not present

## 2021-06-15 DIAGNOSIS — R799 Abnormal finding of blood chemistry, unspecified: Secondary | ICD-10-CM | POA: Diagnosis not present

## 2021-06-15 DIAGNOSIS — R7302 Impaired glucose tolerance (oral): Secondary | ICD-10-CM | POA: Diagnosis not present

## 2021-06-15 DIAGNOSIS — E782 Mixed hyperlipidemia: Secondary | ICD-10-CM | POA: Diagnosis not present

## 2021-06-15 DIAGNOSIS — I10 Essential (primary) hypertension: Secondary | ICD-10-CM | POA: Diagnosis not present

## 2021-06-15 DIAGNOSIS — R42 Dizziness and giddiness: Secondary | ICD-10-CM | POA: Diagnosis not present

## 2021-06-15 DIAGNOSIS — H8113 Benign paroxysmal vertigo, bilateral: Secondary | ICD-10-CM | POA: Diagnosis not present

## 2021-08-20 DIAGNOSIS — I10 Essential (primary) hypertension: Secondary | ICD-10-CM | POA: Diagnosis not present

## 2021-08-20 DIAGNOSIS — E782 Mixed hyperlipidemia: Secondary | ICD-10-CM | POA: Diagnosis not present

## 2021-08-20 DIAGNOSIS — H8113 Benign paroxysmal vertigo, bilateral: Secondary | ICD-10-CM | POA: Diagnosis not present

## 2021-09-12 DIAGNOSIS — I10 Essential (primary) hypertension: Secondary | ICD-10-CM | POA: Diagnosis not present

## 2021-09-12 DIAGNOSIS — R7302 Impaired glucose tolerance (oral): Secondary | ICD-10-CM | POA: Diagnosis not present

## 2021-09-12 DIAGNOSIS — E782 Mixed hyperlipidemia: Secondary | ICD-10-CM | POA: Diagnosis not present

## 2021-09-25 DIAGNOSIS — Z1231 Encounter for screening mammogram for malignant neoplasm of breast: Secondary | ICD-10-CM | POA: Diagnosis not present

## 2021-11-06 DIAGNOSIS — H401131 Primary open-angle glaucoma, bilateral, mild stage: Secondary | ICD-10-CM | POA: Diagnosis not present

## 2021-11-06 DIAGNOSIS — Z961 Presence of intraocular lens: Secondary | ICD-10-CM | POA: Diagnosis not present

## 2021-11-20 DIAGNOSIS — H8113 Benign paroxysmal vertigo, bilateral: Secondary | ICD-10-CM | POA: Diagnosis not present

## 2021-11-20 DIAGNOSIS — I10 Essential (primary) hypertension: Secondary | ICD-10-CM | POA: Diagnosis not present

## 2021-11-20 DIAGNOSIS — E782 Mixed hyperlipidemia: Secondary | ICD-10-CM | POA: Diagnosis not present

## 2021-12-14 DIAGNOSIS — I10 Essential (primary) hypertension: Secondary | ICD-10-CM | POA: Diagnosis not present

## 2021-12-14 DIAGNOSIS — R7302 Impaired glucose tolerance (oral): Secondary | ICD-10-CM | POA: Diagnosis not present

## 2021-12-14 DIAGNOSIS — R252 Cramp and spasm: Secondary | ICD-10-CM | POA: Diagnosis not present

## 2021-12-14 DIAGNOSIS — I70213 Atherosclerosis of native arteries of extremities with intermittent claudication, bilateral legs: Secondary | ICD-10-CM | POA: Diagnosis not present

## 2021-12-14 DIAGNOSIS — R42 Dizziness and giddiness: Secondary | ICD-10-CM | POA: Diagnosis not present

## 2021-12-14 DIAGNOSIS — E782 Mixed hyperlipidemia: Secondary | ICD-10-CM | POA: Diagnosis not present

## 2022-01-05 DIAGNOSIS — I10 Essential (primary) hypertension: Secondary | ICD-10-CM | POA: Diagnosis not present

## 2022-01-16 DIAGNOSIS — I70213 Atherosclerosis of native arteries of extremities with intermittent claudication, bilateral legs: Secondary | ICD-10-CM | POA: Diagnosis not present

## 2022-01-20 DIAGNOSIS — I10 Essential (primary) hypertension: Secondary | ICD-10-CM | POA: Diagnosis not present

## 2022-01-20 DIAGNOSIS — E782 Mixed hyperlipidemia: Secondary | ICD-10-CM | POA: Diagnosis not present

## 2022-03-19 DIAGNOSIS — M13861 Other specified arthritis, right knee: Secondary | ICD-10-CM | POA: Diagnosis not present

## 2022-03-19 DIAGNOSIS — R29898 Other symptoms and signs involving the musculoskeletal system: Secondary | ICD-10-CM | POA: Diagnosis not present

## 2022-03-19 DIAGNOSIS — I10 Essential (primary) hypertension: Secondary | ICD-10-CM | POA: Diagnosis not present

## 2022-03-19 DIAGNOSIS — R7302 Impaired glucose tolerance (oral): Secondary | ICD-10-CM | POA: Diagnosis not present

## 2022-03-21 DIAGNOSIS — R29898 Other symptoms and signs involving the musculoskeletal system: Secondary | ICD-10-CM | POA: Diagnosis not present

## 2022-03-29 DIAGNOSIS — Z0000 Encounter for general adult medical examination without abnormal findings: Secondary | ICD-10-CM | POA: Diagnosis not present

## 2022-03-29 DIAGNOSIS — R7302 Impaired glucose tolerance (oral): Secondary | ICD-10-CM | POA: Diagnosis not present

## 2022-03-29 DIAGNOSIS — M1731 Unilateral post-traumatic osteoarthritis, right knee: Secondary | ICD-10-CM | POA: Diagnosis not present

## 2022-03-29 DIAGNOSIS — Z23 Encounter for immunization: Secondary | ICD-10-CM | POA: Diagnosis not present

## 2022-03-29 DIAGNOSIS — I10 Essential (primary) hypertension: Secondary | ICD-10-CM | POA: Diagnosis not present

## 2022-04-05 ENCOUNTER — Other Ambulatory Visit: Payer: Self-pay | Admitting: Internal Medicine

## 2022-04-05 ENCOUNTER — Ambulatory Visit
Admission: RE | Admit: 2022-04-05 | Discharge: 2022-04-05 | Disposition: A | Discharge status: Home / Self Care | Payer: Medicare Other | Source: Ambulatory Visit | Attending: Internal Medicine | Admitting: Internal Medicine

## 2022-04-05 ENCOUNTER — Ambulatory Visit
Admission: RE | Admit: 2022-04-05 | Discharge: 2022-04-05 | Disposition: A | Discharge status: Home / Self Care | Payer: Medicare Other | Attending: Internal Medicine | Admitting: Internal Medicine

## 2022-04-05 DIAGNOSIS — M5417 Radiculopathy, lumbosacral region: Secondary | ICD-10-CM | POA: Insufficient documentation

## 2022-04-05 DIAGNOSIS — R202 Paresthesia of skin: Secondary | ICD-10-CM | POA: Diagnosis not present

## 2022-04-05 DIAGNOSIS — M419 Scoliosis, unspecified: Secondary | ICD-10-CM | POA: Diagnosis not present

## 2022-04-05 DIAGNOSIS — M4317 Spondylolisthesis, lumbosacral region: Secondary | ICD-10-CM | POA: Diagnosis not present

## 2022-04-05 DIAGNOSIS — M47816 Spondylosis without myelopathy or radiculopathy, lumbar region: Secondary | ICD-10-CM | POA: Diagnosis not present

## 2022-04-09 DIAGNOSIS — M5137 Other intervertebral disc degeneration, lumbosacral region: Secondary | ICD-10-CM | POA: Diagnosis not present

## 2022-04-09 DIAGNOSIS — I10 Essential (primary) hypertension: Secondary | ICD-10-CM | POA: Diagnosis not present

## 2022-04-21 DIAGNOSIS — E782 Mixed hyperlipidemia: Secondary | ICD-10-CM | POA: Diagnosis not present

## 2022-04-21 DIAGNOSIS — I10 Essential (primary) hypertension: Secondary | ICD-10-CM | POA: Diagnosis not present

## 2022-05-11 DIAGNOSIS — H401131 Primary open-angle glaucoma, bilateral, mild stage: Secondary | ICD-10-CM | POA: Diagnosis not present

## 2022-05-11 DIAGNOSIS — Z961 Presence of intraocular lens: Secondary | ICD-10-CM | POA: Diagnosis not present

## 2022-06-28 ENCOUNTER — Encounter: Payer: Self-pay | Admitting: Internal Medicine

## 2022-06-28 ENCOUNTER — Ambulatory Visit (INDEPENDENT_AMBULATORY_CARE_PROVIDER_SITE_OTHER): Payer: Medicare Other | Admitting: Internal Medicine

## 2022-06-28 VITALS — BP 132/68 | HR 76 | Ht 63.0 in | Wt 189.0 lb

## 2022-06-28 DIAGNOSIS — H8113 Benign paroxysmal vertigo, bilateral: Secondary | ICD-10-CM | POA: Diagnosis not present

## 2022-06-28 DIAGNOSIS — I10 Essential (primary) hypertension: Secondary | ICD-10-CM | POA: Insufficient documentation

## 2022-06-28 DIAGNOSIS — R7302 Impaired glucose tolerance (oral): Secondary | ICD-10-CM | POA: Diagnosis not present

## 2022-06-28 DIAGNOSIS — E782 Mixed hyperlipidemia: Secondary | ICD-10-CM | POA: Diagnosis not present

## 2022-06-28 DIAGNOSIS — H401130 Primary open-angle glaucoma, bilateral, stage unspecified: Secondary | ICD-10-CM | POA: Diagnosis not present

## 2022-06-28 NOTE — Progress Notes (Signed)
Established Patient Office Visit  Subjective:  Patient ID: Tammy Hendricks, female    DOB: 25-Sep-1935  Age: 86 y.o. MRN: [id number]  Chief Complaint  Patient presents with   Follow-up    3 month follow up    Patient comes in for her 3-monthfollow-up today.  She is feeling well and has no new complaints.  She is taking all her medications as prescribed.  And she is fasting today for blood work.  Her mammogram is not due until June of this year.     Past Medical History:  Diagnosis Date   Arthritis    right knee   Hypertension    Pre-diabetes    Sciatica    left side - occasional   Vertigo    sporadic   Wears dentures    partial upper, full lower    Past Surgical History:  Procedure Laterality Date   ABDOMINAL HYSTERECTOMY     APPENDECTOMY     CATARACT EXTRACTION W/PHACO Right 11/09/2015   Procedure: CATARACT EXTRACTION PHACO AND INTRAOCULAR LENS PLACEMENT ([place];  Surgeon: CLeandrew Koyanagi MD;  [facility]  Service: Ophthalmology;  Laterality: Right;   CATARACT EXTRACTION W/PHACO Left 12/03/2017   Procedure: CATARACT EXTRACTION PHACO AND INTRAOCULAR LENS PLACEMENT ([place];  Surgeon: BLeandrew Koyanagi MD;  [facility]  Service: Ophthalmology;  Laterality: Left;   CHOLECYSTECTOMY     COLONOSCOPY     OOPHORECTOMY     TONSILLECTOMY      Social History   Socioeconomic History   Marital status: Married    Spouse name: Not on file   Number of children: Not on file   Years of education: Not on file   Highest education level: Not on file  Occupational History   Not on file  Tobacco Use   Smoking status: Never   Smokeless tobacco: Never  Vaping Use   Vaping Use: Never used  Substance and Sexual Activity   Alcohol use: No   Drug use: Not on file   Sexual activity: Not on file  Other Topics Concern   Not on file  Social History Narrative   Not on file   Social Determinants of Health   Financial  Resource Strain: Not on file  Food Insecurity: Not on file  Transportation Needs: Not on file  Physical Activity: Not on file  Stress: Not on file  Social Connections: Not on file  Intimate Partner Violence: Not on file    Family History  Problem Relation Age of Onset   Dementia Mother    Hypertension Father     No Known Allergies  Review of Systems  Constitutional: Negative.   HENT: Negative.    Eyes: Negative.   Respiratory: Negative.    Cardiovascular: Negative.   Gastrointestinal: Negative.   Genitourinary: Negative.   Musculoskeletal: Negative.   Skin: Negative.   Neurological: Negative.   Psychiatric/Behavioral: Negative.         Objective:   BP 132/68   Pulse 76   Ht '5\' 3"'$  (1.6 m)   Wt 189 lb (85.7 kg)   SpO2 96%   BMI 33.48 kg/m   Vitals:   06/28/22 1133  BP: 132/68  Pulse: 76  Height: '5\' 3"'$  (1.6 m)  Weight: 189 lb (85.7 kg)  SpO2: 96%  BMI (Calculated): 33.49    Physical Exam Vitals and nursing note reviewed.  Constitutional:      Appearance: Normal appearance. She is obese.  Cardiovascular:     Rate and Rhythm: Normal rate and regular rhythm.     Pulses: Normal pulses.     Heart sounds: Normal heart sounds.  Pulmonary:     Effort: Pulmonary effort is normal.     Breath sounds: Normal breath sounds.  Abdominal:     General: Abdomen is flat. Bowel sounds are normal.     Palpations: Abdomen is soft.  Musculoskeletal:        General: Normal range of motion.     Cervical back: Normal range of motion and neck supple.  Skin:    General: Skin is warm and dry.  Neurological:     General: No focal deficit present.     Mental Status: She is alert and oriented to person, place, and time.  Psychiatric:        Mood and Affect: Mood normal.        Behavior: Behavior normal.      No results found for any visits on 06/28/22.  No results found for this or any previous visit (from the past 2160 hour(s)).    Assessment & Plan:  Patient  advised to continue all medications.  She will be getting blood work today. Problem List Items Addressed This Visit     Impaired glucose tolerance - Primary   Relevant Orders   Hemoglobin A1c   Mixed hyperlipidemia   Relevant Orders   Lipid Panel w/o Chol/HDL Ratio   Essential hypertension, benign   Relevant Orders   CMP14+EGFR   Benign paroxysmal positional vertigo due to bilateral vestibular disorder   Primary open angle glaucoma (POAG) of both eyes    Return in about 3 months (around 09/28/2022).   Total time spent: 25 minutes  Perrin Maltese, MD  06/28/2022

## 2022-06-29 LAB — HEMOGLOBIN A1C
Est. average glucose Bld gHb Est-mCnc: 143 mg/dL
Hgb A1c MFr Bld: 6.6 % — ABNORMAL HIGH (ref 4.8–5.6)

## 2022-06-29 LAB — LIPID PANEL W/O CHOL/HDL RATIO
Cholesterol, Total: 161 mg/dL (ref 100–199)
HDL: 54 mg/dL
LDL Chol Calc (NIH): 91 mg/dL (ref 0–99)
Triglycerides: 84 mg/dL (ref 0–149)
VLDL Cholesterol Cal: 16 mg/dL (ref 5–40)

## 2022-06-29 LAB — CMP14+EGFR
ALT: 17 IU/L (ref 0–32)
AST: 18 IU/L (ref 0–40)
Albumin/Globulin Ratio: 1.3 (ref 1.2–2.2)
Albumin: 4.1 g/dL (ref 3.7–4.7)
Alkaline Phosphatase: 80 IU/L (ref 44–121)
BUN/Creatinine Ratio: 22 (ref 12–28)
BUN: 14 mg/dL (ref 8–27)
Bilirubin Total: 0.4 mg/dL (ref 0.0–1.2)
CO2: 24 mmol/L (ref 20–29)
Calcium: 9.3 mg/dL (ref 8.7–10.3)
Chloride: 101 mmol/L (ref 96–106)
Creatinine, Ser: 0.65 mg/dL (ref 0.57–1.00)
Globulin, Total: 3.1 g/dL (ref 1.5–4.5)
Glucose: 117 mg/dL — ABNORMAL HIGH (ref 70–99)
Potassium: 3.8 mmol/L (ref 3.5–5.2)
Sodium: 141 mmol/L (ref 134–144)
Total Protein: 7.2 g/dL (ref 6.0–8.5)
eGFR: 87 mL/min/{1.73_m2} (ref >59)

## 2022-09-28 ENCOUNTER — Ambulatory Visit: Payer: Medicare Other | Admitting: Internal Medicine

## 2022-10-15 ENCOUNTER — Ambulatory Visit: Payer: Medicare Other

## 2022-10-15 DIAGNOSIS — I10 Essential (primary) hypertension: Secondary | ICD-10-CM

## 2022-10-15 NOTE — Chronic Care Management (AMB) (Signed)
Follow Up Pharmacist Visit (CCM)  Tammy Hendricks,Tammy Hendricks  87 years, Female  DOB: Oct 21, 1935  M: [phone]  __________________________________________________ Clinical Summary Next CCM Follow Up: N/A Next AWV: to be scheduled Summary for PCP:  - Patient needs to be scheduled for AWV -Recommended immunizations - Pneumonia, shingles.  Patient Chart Prep (HC) Chronic Conditions Patient's Chronic Conditions: Hyperlipidemia/Dyslipidemia (HLD), Hypertension (HTN) Doctor and Hospital Visits Were there PCP Visits since last visit with the Pharmacist?: Yes PCP Visits details: 06/28/2022-[facility] ordered, No medication changes Were there Specialist Visits since last visit with the Pharmacist?: No Was there a Hospital Visit in last 30 days?: No Were there other Hospital Visits since last visit with the Pharmacist?: No Engagement Notes Laury Axon on 10/11/2022 09:43 AM Chart prep: Bostick, Ashlea on 06/04/2022 05:17 PM R/S appointment to 10/15/22 at 10:30 Billee Cashing on 04/06/2022 10:56 AM cp f/u visit 09/04/22 at 3pm via phone Pre-Call Questions [facility]) Pre-Call Questions Date:: 10/11/2022 Time:: PM Outcome:: Unsuccessful - Left message Relevant details: LVM with appt details Engagement Notes Charlestine Massed on 10/11/2022 03:21 PM Pre call: 2 mins Disease Assessments Subjective Information Visit Completed on: 10/15/2022 Subjective: She is not doing a whole lot now due to the heat, not getting out. She walks in the house, household chores, light exercise. Exercise - walking in the house. She eats frozen vegetables, juices. She doesn't eat much snack - keeps raisins, yogurt. Lifestyle habits: no smoking or alcohol What is the patient's sleep pattern?: No sleep issues How many hours per night does patient typically sleep?: sleeps 7hrs SDOH: Accountable Health Communities Health-Related Social Needs Screening  Tool ([email or web address]) SDOH questions were documented and reviewed (EMR or [employer]) within the past 12 months or since hospitalization?: No Think about the place you live. Do you have problems with any of the following? (ref #2): None of the above Within the past 12 months, you worried that your food would run out before you got money to buy more (ref #3): Never true Within the past 12 months, the food you bought just didn't last and you didn't have money to get more (ref #4): Never true In the past 12 months, has lack of reliable transportation kept you from medical appointments, meetings, work or from getting things needed for daily living? (ref #5): No In the past 12 months, has the electric, gas, oil, or water company threatened to shut off services in your home? (ref #6): No How often does anyone, including family and friends, physically hurt you? (ref #7): Never (1) How often does anyone, including family and friends, insult or talk down to you? (ref #8): Never (1) How often does anyone, including friends and family, threaten you with harm? (ref #9): Never (1) How often does anyone, including family and friends, scream or curse at you? (ref #10): Never (1) Medication Adherence Does the [facility] have access to medication refill history?: No Is Patient using [facility] pharmacy?: No [facility] and location of [facility] Current Rx insurance plan: [employer] Are meds delivered by current pharmacy?: Yes - by mail order pharmacy Hypertension (HTN) Most Recent BP: 132/68 Most Recent HR: 76 taken on: 06/28/2022 Care Gap: Need BP documented or last BP 140/90 or higher: Addressed Assessed today?: Yes Goal: <130/80 mmHG Is Patient checking BP at home?: Yes Has patient experienced hypotension, dizziness, falls or bradycardia?: No We discussed: DASH diet:  following a diet emphasizing fruits and vegetables and low-fat dairy products along  with whole grains, fish, poultry, and nuts. Reducing red  meats and sugars., Contacting PCP office for signs and symptoms of high or low blood pressure (hypotension, dizziness, falls, headaches, edema), Proper Home BP Measurement Assessment:: Controlled Drug: HCTZ 25mg -QD Pharmacist Assessment: Appropriate, Effective, Safe, Accessible Hyperlipidemia/Dyslipidemia (HLD) Last Lipid panel on: 06/28/2022 TC (Goal<200): 161 LDL: 91 HDL (Goal>40): 54 TG (Goal<150): 84 ASCVD 10-year risk?is:: N/A due to Age > 93 Assessed today?: No Preventative Health Care Gap: Colorectal cancer screening: Patient excluded from population (Age > 26, hx of colorectal cancer or colectomy, hospice services) Care Gap: Breast cancer screening: Patient excluded from population (Age > 33, hx of bilateral mastectomy, frailty, hospice services) Care Gap: Annual Wellness Visit (AWV): Needs to be addressed Lynann Bologna, PharmD Televisit Documentation 

## 2022-11-12 DIAGNOSIS — H401131 Primary open-angle glaucoma, bilateral, mild stage: Secondary | ICD-10-CM | POA: Diagnosis not present

## 2022-12-01 ENCOUNTER — Emergency Department
Admission: EM | Admit: 2022-12-01 | Discharge: 2022-12-01 | Disposition: A | Discharge status: Home / Self Care | Payer: Medicare Other | Attending: Emergency Medicine | Admitting: Emergency Medicine

## 2022-12-01 ENCOUNTER — Other Ambulatory Visit: Payer: Self-pay

## 2022-12-01 DIAGNOSIS — R404 Transient alteration of awareness: Secondary | ICD-10-CM | POA: Diagnosis not present

## 2022-12-01 DIAGNOSIS — E876 Hypokalemia: Secondary | ICD-10-CM | POA: Diagnosis not present

## 2022-12-01 DIAGNOSIS — M79604 Pain in right leg: Secondary | ICD-10-CM | POA: Diagnosis not present

## 2022-12-01 DIAGNOSIS — R55 Syncope and collapse: Secondary | ICD-10-CM | POA: Diagnosis not present

## 2022-12-01 DIAGNOSIS — Z743 Need for continuous supervision: Secondary | ICD-10-CM | POA: Diagnosis not present

## 2022-12-01 DIAGNOSIS — R6889 Other general symptoms and signs: Secondary | ICD-10-CM | POA: Diagnosis not present

## 2022-12-01 LAB — CBC
HCT: 35.3 % — ABNORMAL LOW (ref 36.0–46.0)
Hemoglobin: 11.3 g/dL — ABNORMAL LOW (ref 12.0–15.0)
MCH: 24.7 pg — ABNORMAL LOW (ref 26.0–34.0)
MCHC: 32 g/dL (ref 30.0–36.0)
MCV: 77.2 fL — ABNORMAL LOW (ref 80.0–100.0)
Platelets: 198 10*3/uL (ref 150–400)
RBC: 4.57 MIL/uL (ref 3.87–5.11)
RDW: 14.4 % (ref 11.5–15.5)
WBC: 3.9 10*3/uL — ABNORMAL LOW (ref 4.0–10.5)
nRBC: 0 % (ref 0.0–0.2)

## 2022-12-01 LAB — BASIC METABOLIC PANEL
Anion gap: 10 (ref 5–15)
BUN: 16 mg/dL (ref 8–23)
CO2: 28 mmol/L (ref 22–32)
Calcium: 8.9 mg/dL (ref 8.9–10.3)
Chloride: 100 mmol/L (ref 98–111)
Creatinine, Ser: 0.65 mg/dL (ref 0.44–1.00)
GFR, Estimated: >60 mL/min (ref >60)
Glucose, Bld: 150 mg/dL — ABNORMAL HIGH (ref 70–99)
Potassium: 2.8 mmol/L — ABNORMAL LOW (ref 3.5–5.1)
Sodium: 138 mmol/L (ref 135–145)

## 2022-12-01 LAB — MAGNESIUM: Magnesium: 1.8 mg/dL (ref 1.7–2.4)

## 2022-12-01 LAB — TROPONIN I (HIGH SENSITIVITY): Troponin I (High Sensitivity): 2 ng/L (ref <18)

## 2022-12-01 MED ORDER — POTASSIUM CHLORIDE CRYS ER 20 MEQ PO TBCR
40.0000 meq | EXTENDED_RELEASE_TABLET | Freq: Once | ORAL | Status: AC
  Administered 2022-12-01: 40 meq via ORAL
  Filled 2022-12-01: qty 2

## 2022-12-01 MED ORDER — POTASSIUM CHLORIDE CRYS ER 20 MEQ PO TBCR: 20.0000 meq | EXTENDED_RELEASE_TABLET | Freq: Every day | ORAL | 0 refills | Status: DC

## 2022-12-01 NOTE — ED Triage Notes (Signed)
BIB [facility] from home. Pt called EMS reporting near syncope while sitting on her couch. States she got really hot. Pt also c/o R leg pain without trauma or injury. Alert and oriented x4. Hx of HTN. Denies chest pain/pressure, vision change, h/a, dizziness, abd pain.   EMS Vitals:   180/92 HR 86 99% RA CBG 153 98.7 temp

## 2022-12-01 NOTE — ED Provider Notes (Signed)
[facility] Provider Note    Event Date/Time   First MD Initiated Contact with Patient 12/01/22 [phone]     (approximate)   History   Near Syncope   HPI Tammy Hendricks is a 87 y.o. female who presents for evaluation of passing out.  She reports that she started to feel hot all over and then fell like she was get a pass out.  She denies having any chest pain, chest pressure, visual changes, headache, vertigo, pain in her abdomen, nausea, shortness of breath, or recent dysuria.  She did not have a fall as a result of her almost passing out since she was still seated.  She said that she feels normal now.  She believes that she has been eating and drinking appropriately.  She says she tries to stay hydrated with plenty of fluids.  No recent change in medication or dietary habits.  No known history of heart problems including heart failure.     Physical Exam   Triage Vital Signs: ED Triage Vitals  Encounter Vitals Group     BP 12/01/22 0326 (!) 169/96     Systolic BP Percentile --      Diastolic BP Percentile --      Pulse Rate 12/01/22 0326 77     Resp 12/01/22 0326 17     Temp 12/01/22 0326 98.3 F (36.8 C)     Temp Source 12/01/22 0326 Oral     SpO2 12/01/22 0326 97 %     Weight 12/01/22 0326 85.7 kg (189 lb)     Height 12/01/22 0326 1.575 m (5\' 2" )     Head Circumference --      Peak Flow --      Pain Score 12/01/22 0324 0     Pain Loc --      Pain Education --      Exclude from Growth Chart --     Most recent vital signs: Vitals:   12/01/22 0630 12/01/22 0745  BP: (!) 154/80 (!) 184/88  Pulse: 81 81  Resp: 18 16  Temp:  97.7 F (36.5 C)  SpO2: 99% 98%    General: Awake, no distress.  Very pleasant and generally well-appearing for age. CV:  Good peripheral perfusion.  Normal heart sounds, regular rate and rhythm. Resp:  Normal effort. Speaking easily and comfortably, no accessory muscle usage nor intercostal retractions.  Lungs are  clear to auscultation bilaterally. Abd:  No distention.  Obese. Other:  Patient is alert and oriented and does not appear confused.  Her family is with her at bedside and confirms this is her baseline.   ED Results / Procedures / Treatments   Labs (all labs ordered are listed, but only abnormal results are displayed) Labs Reviewed  BASIC METABOLIC PANEL - Abnormal; Notable for the following components:      Result Value   Potassium 2.8 (*)    Glucose, Bld 150 (*)    All other components within normal limits  CBC - Abnormal; Notable for the following components:   WBC 3.9 (*)    Hemoglobin 11.3 (*)    HCT 35.3 (*)    MCV 77.2 (*)    MCH 24.7 (*)    All other components within normal limits  MAGNESIUM  CBG MONITORING, ED  TROPONIN I (HIGH SENSITIVITY)     EKG  ED ECG REPORT I, Loleta Rose, the attending physician, personally viewed and interpreted this ECG.  Date: 12/01/2022 EKG Time:  3:42 AM Rate: 66 Rhythm: normal sinus rhythm QRS Axis: normal Intervals: Left ventricular hypertrophy ST/T Wave abnormalities: normal Narrative Interpretation: no evidence of acute ischemia    RADIOLOGY None   PROCEDURES:  Critical Care performed: No  .1-3 Lead EKG Interpretation  Performed by: Loleta Rose, MD Authorized by: Loleta Rose, MD     Interpretation: normal     ECG rate:  80   ECG rate assessment: normal     Rhythm: sinus rhythm     Ectopy: none     Conduction: normal       IMPRESSION / MDM / ASSESSMENT AND PLAN / ED COURSE  I reviewed the triage vital signs and the nursing notes.                              Differential diagnosis includes, but is not limited to, vasovagal episode, orthostatic syncope, cardiac arrhythmia, ACS, electrolyte or metabolic abnormality, dehydration, less likely PE and CVA.  Patient's presentation is most consistent with acute presentation with potential threat to life or bodily function.  Labs/studies ordered: CBC, BMP,  magnesium level, high-sensitivity troponin  Interventions/Medications given:  Medications  potassium chloride SA (KLOR-CON M) CR tablet 40 mEq (40 mEq Oral Given 12/01/22 0426)    (Note:  hospital course my include additional interventions and/or labs/studies not listed above.)   Although there was a question of the patient's orthostatic vital signs prehospital, her vital signs have been stable and reassuring in the ED.  She is also asymptomatic.  EKG is essentially normal with no concerning abnormalities.  Lab workup is most notable for hypokalemia with a normal magnesium level, but I doubt the hypokalemia is causing her any clinical issues.  I ordered repletion and provided a prescription.  The patient is very eager and happy to go home.  No troponin was sent on her initial lab work so I waited until she had been here an extended period of time and sent a new blood draw for troponin as if it was the 2+ hour mark.  It was normal and she and her family were reassured.  I encourage plenty of oral fluids, potassium supplement, and close follow-up with her PCP.  They understand and agree with the plan.  The patient is on the evaluate for evidence of arrhythmia and/or significant heart rate changes.       FINAL CLINICAL IMPRESSION(S) / ED DIAGNOSES   Final diagnoses:  Near syncope  Hypokalemia     Rx / DC Orders   ED Discharge Orders          Ordered    potassium chloride SA (KLOR-CON M20) 20 MEQ tablet  Daily        12/01/22 0726             Note:  This document was prepared using Dragon voice recognition software and may include unintentional dictation errors.   Loleta Rose, MD 12/01/22 [phone]

## 2022-12-01 NOTE — Discharge Instructions (Signed)
You have been seen today in the Emergency Department (ED)  for near syncope (almost passing out).  Your workup including labs and EKG show reassuring results.  Your symptoms may be due to very mild dehydration, so it is important that you drink plenty of non-alcoholic fluids.  your potassium level was a bit low as well, so please take the supplement we prescribed for the next week.  Please call your regular doctor as soon as possible to schedule the next available clinic appointment to follow up with him/her regarding your visit to the ED and your symptoms.  Return to the Emergency Department (ED)  if you have any further syncopal episodes (pass out again) or develop ANY chest pain, pressure, tightness, trouble breathing, sudden sweating, or other symptoms that concern you.

## 2022-12-04 DIAGNOSIS — Z961 Presence of intraocular lens: Secondary | ICD-10-CM | POA: Diagnosis not present

## 2022-12-04 DIAGNOSIS — H401131 Primary open-angle glaucoma, bilateral, mild stage: Secondary | ICD-10-CM | POA: Diagnosis not present

## 2022-12-04 DIAGNOSIS — H43813 Vitreous degeneration, bilateral: Secondary | ICD-10-CM | POA: Diagnosis not present

## 2022-12-07 ENCOUNTER — Telehealth: Payer: Self-pay

## 2022-12-07 NOTE — Telephone Encounter (Signed)
Transition Care Management Unsuccessful Follow-up Telephone Call  Date of discharge and from where:  [place] 8/10  Attempts:  1st Attempt  Reason for unsuccessful TCM follow-up call:  No answer/busy   Lenard Forth [facility] Guide, [facility] [phone] [address] [place], [place], [place] [address] Phone: [phone] Email: [email or web address]

## 2022-12-07 NOTE — Telephone Encounter (Signed)
Transition Care Management Unsuccessful Follow-up Telephone Call  Date of discharge and from where:  [place] 8/10  Attempts:  2nd Attempt  Reason for unsuccessful TCM follow-up call:  No answer/busy   Lenard Forth [facility] Guide, [facility] [phone] [address] [place], [place], [place] [address] Phone: [phone] Email: [email or web address]

## 2022-12-13 DIAGNOSIS — Z1231 Encounter for screening mammogram for malignant neoplasm of breast: Secondary | ICD-10-CM | POA: Diagnosis not present

## 2022-12-22 ENCOUNTER — Other Ambulatory Visit: Payer: Self-pay | Admitting: Internal Medicine

## 2023-02-11 ENCOUNTER — Encounter: Payer: Self-pay | Admitting: Internal Medicine

## 2023-02-11 ENCOUNTER — Ambulatory Visit (INDEPENDENT_AMBULATORY_CARE_PROVIDER_SITE_OTHER): Payer: Medicare Other | Admitting: Internal Medicine

## 2023-02-11 VITALS — BP 140/80 | HR 75 | Ht 63.0 in | Wt 191.8 lb

## 2023-02-11 DIAGNOSIS — D508 Other iron deficiency anemias: Secondary | ICD-10-CM | POA: Diagnosis not present

## 2023-02-11 DIAGNOSIS — E782 Mixed hyperlipidemia: Secondary | ICD-10-CM | POA: Diagnosis not present

## 2023-02-11 DIAGNOSIS — E876 Hypokalemia: Secondary | ICD-10-CM | POA: Diagnosis not present

## 2023-02-11 DIAGNOSIS — I10 Essential (primary) hypertension: Secondary | ICD-10-CM

## 2023-02-11 DIAGNOSIS — R7303 Prediabetes: Secondary | ICD-10-CM

## 2023-02-11 MED ORDER — POTASSIUM CHLORIDE CRYS ER 20 MEQ PO TBCR: 20.0000 meq | EXTENDED_RELEASE_TABLET | Freq: Every day | ORAL | 3 refills | Status: DC

## 2023-02-11 NOTE — Progress Notes (Signed)
Established Patient Office Visit  Subjective:  Patient ID: Tammy Hendricks, female    DOB: 1935/10/17  Age: 87 y.o. MRN: [id number]  Chief Complaint  Patient presents with   Follow-up    6 mo f/u    Patient comes in for hospital follow-up.  She was in the ED on December 01, 2022 with near syncope.  Her evaluation revealed that her potassium was very low which was then replaced.  Since then patient has been feeling well has and did not have any further episodes.  Denies headaches or dizziness, no nausea vomiting, no diarrhea or constipation. She will get blood work done today.    No other concerns at this time.   Past Medical History:  Diagnosis Date   Arthritis    right knee   Hypertension    Pre-diabetes    Sciatica    left side - occasional   Vertigo    sporadic   Wears dentures    partial upper, full lower    Past Surgical History:  Procedure Laterality Date   ABDOMINAL HYSTERECTOMY     APPENDECTOMY     CATARACT EXTRACTION W/PHACO Right 11/09/2015   Procedure: CATARACT EXTRACTION PHACO AND INTRAOCULAR LENS PLACEMENT (IOC) right eye;  Surgeon: Lockie Mola, MD;  Location: [facility];  Service: Ophthalmology;  Laterality: Right;   CATARACT EXTRACTION W/PHACO Left 12/03/2017   Procedure: CATARACT EXTRACTION PHACO AND INTRAOCULAR LENS PLACEMENT (IOC) LEFT EYE;  Surgeon: Lockie Mola, MD;  Location: [facility];  Service: Ophthalmology;  Laterality: Left;   CHOLECYSTECTOMY     COLONOSCOPY     OOPHORECTOMY     TONSILLECTOMY      Social History   Socioeconomic History   Marital status: Married    Spouse name: Not on file   Number of children: Not on file   Years of education: Not on file   Highest education level: Not on file  Occupational History   Not on file  Tobacco Use   Smoking status: Never   Smokeless tobacco: Never  Vaping Use   Vaping status: Never Used  Substance and Sexual Activity   Alcohol use: No   Drug use:  Not on file   Sexual activity: Not on file  Other Topics Concern   Not on file  Social History Narrative   Not on file   Social Determinants of Health   Financial Resource Strain: Not on file  Food Insecurity: Not on file  Transportation Needs: Not on file  Physical Activity: Not on file  Stress: Not on file  Social Connections: Not on file  Intimate Partner Violence: Not on file    Family History  Problem Relation Age of Onset   Dementia Mother    Hypertension Father     No Known Allergies  Review of Systems  Constitutional: Negative.  Negative for chills, diaphoresis, fever, malaise/fatigue and weight loss.  HENT: Negative.    Eyes: Negative.   Respiratory: Negative.  Negative for cough and shortness of breath.   Cardiovascular: Negative.  Negative for chest pain, palpitations and leg swelling.  Gastrointestinal: Negative.  Negative for abdominal pain, constipation, diarrhea, heartburn, nausea and vomiting.  Genitourinary: Negative.  Negative for dysuria and flank pain.  Musculoskeletal: Negative.  Negative for joint pain and myalgias.  Skin: Negative.   Neurological:  Positive for tingling. Negative for dizziness, seizures, weakness and headaches.  Endo/Heme/Allergies: Negative.   Psychiatric/Behavioral: Negative.  Negative for depression and suicidal ideas. The patient is  not nervous/anxious.        Objective:   BP (!) 140/80   Pulse 75   Ht 5\' 3"  (1.6 m)   Wt 191 lb 12.8 oz (87 kg)   SpO2 92%   BMI 33.98 kg/m   Vitals:   02/11/23 1115  BP: (!) 140/80  Pulse: 75  Height: 5\' 3"  (1.6 m)  Weight: 191 lb 12.8 oz (87 kg)  SpO2: 92%  BMI (Calculated): 33.98    Physical Exam Vitals and nursing note reviewed.  Constitutional:      Appearance: Normal appearance.  HENT:     Head: Normocephalic and atraumatic.     Nose: Nose normal.     Mouth/Throat:     Mouth: Mucous membranes are moist.     Pharynx: Oropharynx is clear.  Eyes:      Conjunctiva/sclera: Conjunctivae normal.     Pupils: Pupils are equal, round, and reactive to light.  Cardiovascular:     Rate and Rhythm: Normal rate and regular rhythm.     Pulses: Normal pulses.     Heart sounds: Normal heart sounds. No murmur heard. Pulmonary:     Effort: Pulmonary effort is normal.     Breath sounds: Normal breath sounds. No wheezing.  Abdominal:     General: Bowel sounds are normal.     Palpations: Abdomen is soft.     Tenderness: There is no abdominal tenderness. There is no right CVA tenderness or left CVA tenderness.  Musculoskeletal:        General: Normal range of motion.     Cervical back: Normal range of motion.     Right lower leg: No edema.     Left lower leg: No edema.  Skin:    General: Skin is warm and dry.  Neurological:     General: No focal deficit present.     Mental Status: She is alert and oriented to person, place, and time.  Psychiatric:        Mood and Affect: Mood normal.        Behavior: Behavior normal.      No results found for any visits on 02/11/23.  Recent Results (from the past 2160 hour(s))  Basic metabolic panel     Status: Abnormal   Collection Time: 12/01/22  3:28 AM  Result Value Ref Range   Sodium 138 135 - 145 mmol/L   Potassium 2.8 (L) 3.5 - 5.1 mmol/L   Chloride 100 98 - 111 mmol/L   CO2 28 22 - 32 mmol/L   Glucose, Bld 150 (H) 70 - 99 mg/dL    Comment: Glucose reference range applies only to samples taken after fasting for at least 8 hours.   BUN 16 8 - 23 mg/dL   Creatinine, Ser 6.43 0.44 - 1.00 mg/dL   Calcium 8.9 8.9 - 32.9 mg/dL   GFR, Estimated >51 >88 mL/min    Comment: (NOTE) Calculated using the CKD-EPI Creatinine Equation (2021)    Anion gap 10 5 - 15    Comment: Performed at [facility], [address] [place], [place] [address]  CBC     Status: Abnormal   Collection Time: 12/01/22  3:28 AM  Result Value Ref Range   WBC 3.9 (L) 4.0 - 10.5 K/uL   RBC 4.57 3.87 - 5.11 MIL/uL    Hemoglobin 11.3 (L) 12.0 - 15.0 g/dL   HCT 63.0 (L) 16.0 - 10.9 %   MCV 77.2 (L) 80.0 - 100.0 fL  MCH 24.7 (L) 26.0 - 34.0 pg   MCHC 32.0 30.0 - 36.0 g/dL   RDW 14.7 82.9 - 56.2 %   Platelets 198 150 - 400 K/uL   nRBC 0.0 0.0 - 0.2 %    Comment: Performed at [facility], [address], [place], [place] [address]  Magnesium     Status: None   Collection Time: 12/01/22  3:28 AM  Result Value Ref Range   Magnesium 1.8 1.7 - 2.4 mg/dL    Comment: Performed at [facility], [address] [place], [place] [address]  Troponin I (High Sensitivity)     Status: None   Collection Time: 12/01/22  3:28 AM  Result Value Ref Range   Troponin I (High Sensitivity) 2 <18 ng/L    Comment: (NOTE) Elevated high sensitivity troponin I (hsTnI) values and significant  changes across serial measurements may suggest ACS but many other  chronic and acute conditions are known to elevate hsTnI results.  Refer to the "Links" section for chest pain algorithms and additional  guidance. Performed at [facility], [address], [place], [place] [address]       Assessment & Plan:  Patient to continue all her medications.  Monitor blood pressure.  Needs to return for AWV. Problem List Items Addressed This Visit     Mixed hyperlipidemia   Relevant Orders   Lipid Panel w/o Chol/HDL Ratio   Essential hypertension, benign - Primary   Relevant Orders   CMP14+EGFR   Other Visit Diagnoses     Prediabetes       Relevant Orders   Hemoglobin A1c   Other iron deficiency anemia       Relevant Orders   CBC with Diff   Hypokalemia       Relevant Medications   potassium chloride SA (KLOR-CON M20) 20 MEQ tablet       Return in about 3 weeks (around 03/04/2023).   Total time spent: 30 minutes  Margaretann Loveless, MD  02/11/2023   This document may have been prepared by [facility] Voice Recognition software and as such may include unintentional dictation errors.

## 2023-02-12 LAB — CBC WITH DIFFERENTIAL/PLATELET
Basophils Absolute: 0 10*3/uL (ref 0.0–0.2)
Basos: 1 %
EOS (ABSOLUTE): 0.1 10*3/uL (ref 0.0–0.4)
Eos: 2 %
Hematocrit: 35.8 % (ref 34.0–46.6)
Hemoglobin: 11.6 g/dL (ref 11.1–15.9)
Immature Grans (Abs): 0 10*3/uL (ref 0.0–0.1)
Immature Granulocytes: 0 %
Lymphocytes Absolute: 1.4 10*3/uL (ref 0.7–3.1)
Lymphs: 36 %
MCH: 24.8 pg — ABNORMAL LOW (ref 26.6–33.0)
MCHC: 32.4 g/dL (ref 31.5–35.7)
MCV: 77 fL — ABNORMAL LOW (ref 79–97)
Monocytes Absolute: 0.4 10*3/uL (ref 0.1–0.9)
Monocytes: 11 %
Neutrophils Absolute: 2 10*3/uL (ref 1.4–7.0)
Neutrophils: 50 %
Platelets: 219 10*3/uL (ref 150–450)
RBC: 4.67 x10E6/uL (ref 3.77–5.28)
RDW: 13.8 % (ref 11.7–15.4)
WBC: 3.9 10*3/uL (ref 3.4–10.8)

## 2023-02-12 LAB — CMP14+EGFR
ALT: 15 [IU]/L (ref 0–32)
AST: 17 [IU]/L (ref 0–40)
Albumin: 4.1 g/dL (ref 3.7–4.7)
Alkaline Phosphatase: 83 [IU]/L (ref 44–121)
BUN/Creatinine Ratio: 18 (ref 12–28)
BUN: 14 mg/dL (ref 8–27)
Bilirubin Total: 0.6 mg/dL (ref 0.0–1.2)
CO2: 26 mmol/L (ref 20–29)
Calcium: 9.5 mg/dL (ref 8.7–10.3)
Chloride: 101 mmol/L (ref 96–106)
Creatinine, Ser: 0.77 mg/dL (ref 0.57–1.00)
Globulin, Total: 3.3 g/dL (ref 1.5–4.5)
Glucose: 120 mg/dL — ABNORMAL HIGH (ref 70–99)
Potassium: 3.5 mmol/L (ref 3.5–5.2)
Sodium: 143 mmol/L (ref 134–144)
Total Protein: 7.4 g/dL (ref 6.0–8.5)
eGFR: 76 mL/min/{1.73_m2} (ref >59)

## 2023-02-12 LAB — LIPID PANEL W/O CHOL/HDL RATIO
Cholesterol, Total: 177 mg/dL (ref 100–199)
HDL: 53 mg/dL (ref >39)
LDL Chol Calc (NIH): 103 mg/dL — ABNORMAL HIGH (ref 0–99)
Triglycerides: 117 mg/dL (ref 0–149)
VLDL Cholesterol Cal: 21 mg/dL (ref 5–40)

## 2023-02-12 LAB — HEMOGLOBIN A1C
Est. average glucose Bld gHb Est-mCnc: 143 mg/dL
Hgb A1c MFr Bld: 6.6 % — ABNORMAL HIGH (ref 4.8–5.6)

## 2023-03-08 ENCOUNTER — Encounter: Payer: Self-pay | Admitting: Internal Medicine

## 2023-03-08 ENCOUNTER — Ambulatory Visit (INDEPENDENT_AMBULATORY_CARE_PROVIDER_SITE_OTHER): Payer: Medicare Other | Admitting: Internal Medicine

## 2023-03-08 VITALS — BP 140/70 | HR 71 | Ht 62.0 in | Wt 192.4 lb

## 2023-03-08 DIAGNOSIS — D508 Other iron deficiency anemias: Secondary | ICD-10-CM | POA: Diagnosis not present

## 2023-03-08 DIAGNOSIS — I10 Essential (primary) hypertension: Secondary | ICD-10-CM | POA: Diagnosis not present

## 2023-03-08 DIAGNOSIS — R7303 Prediabetes: Secondary | ICD-10-CM | POA: Diagnosis not present

## 2023-03-08 DIAGNOSIS — E782 Mixed hyperlipidemia: Secondary | ICD-10-CM

## 2023-03-08 DIAGNOSIS — Z13820 Encounter for screening for osteoporosis: Secondary | ICD-10-CM

## 2023-03-08 NOTE — Progress Notes (Signed)
Established Patient Office Visit  Subjective:  Patient ID: Tammy Hendricks, female    DOB: 1935-10-26  Age: 87 y.o. MRN: [id number]  Chief Complaint  Patient presents with   Annual Exam    AWV    Patient comes in for her follow-up today.  He is generally feeling well and has no new complaints.  Her recent labs showed normalization of her potassium.  She does not have any headaches, no dizziness and no chest pains.  She is up-to-date on her mammogram. Needs to schedule a DEXA scan. Declines to do a Cologuard.    No other concerns at this time.   Past Medical History:  Diagnosis Date   Arthritis    right knee   Hypertension    Pre-diabetes    Sciatica    left side - occasional   Vertigo    sporadic   Wears dentures    partial upper, full lower    Past Surgical History:  Procedure Laterality Date   ABDOMINAL HYSTERECTOMY     APPENDECTOMY     CATARACT EXTRACTION W/PHACO Right 11/09/2015   Procedure: CATARACT EXTRACTION PHACO AND INTRAOCULAR LENS PLACEMENT (IOC) right eye;  Surgeon: Lockie Mola, MD;  Location: [facility];  Service: Ophthalmology;  Laterality: Right;   CATARACT EXTRACTION W/PHACO Left 12/03/2017   Procedure: CATARACT EXTRACTION PHACO AND INTRAOCULAR LENS PLACEMENT (IOC) LEFT EYE;  Surgeon: Lockie Mola, MD;  Location: [facility];  Service: Ophthalmology;  Laterality: Left;   CHOLECYSTECTOMY     COLONOSCOPY     OOPHORECTOMY     TONSILLECTOMY      Social History   Socioeconomic History   Marital status: Married    Spouse name: Not on file   Number of children: Not on file   Years of education: Not on file   Highest education level: Not on file  Occupational History   Not on file  Tobacco Use   Smoking status: Never   Smokeless tobacco: Never  Vaping Use   Vaping status: Never Used  Substance and Sexual Activity   Alcohol use: No   Drug use: Not on file   Sexual activity: Not on file  Other Topics Concern    Not on file  Social History Narrative   Not on file   Social Determinants of Health   Financial Resource Strain: Not on file  Food Insecurity: Not on file  Transportation Needs: Not on file  Physical Activity: Not on file  Stress: Not on file  Social Connections: Not on file  Intimate Partner Violence: Not on file    Family History  Problem Relation Age of Onset   Dementia Mother    Hypertension Father     No Known Allergies  Outpatient Medications Prior to Visit  Medication Sig   acetaminophen (TYLENOL) 650 MG CR tablet Take 650 mg by mouth every 8 (eight) hours as needed for pain.   aspirin EC 81 MG tablet Take 81 mg by mouth daily. Swallow whole.   brimonidine (ALPHAGAN) 0.2 % ophthalmic solution Place 1 drop into both eyes 2 (two) times daily.   hydrochlorothiazide (HYDRODIURIL) 25 MG tablet TAKE 1 TABLET BY MOUTH ONCE  DAILY   Krill Oil 500 MG CAPS Take 1 capsule by mouth daily.   latanoprost (XALATAN) 0.005 % ophthalmic solution Place 1 drop into both eyes at bedtime.   meloxicam (MOBIC) 15 MG tablet Take 15 mg by mouth daily.   potassium chloride SA (KLOR-CON M20) 20 MEQ  tablet Take 1 tablet (20 mEq total) by mouth daily.   timolol (TIMOPTIC) 0.5 % ophthalmic solution Administer 1 drop to both eyes every morning.   No facility-administered medications prior to visit.    Review of Systems  Constitutional: Negative.  Negative for chills, diaphoresis, fever, malaise/fatigue and weight loss.  HENT: Negative.  Negative for congestion and sore throat.   Eyes: Negative.   Respiratory: Negative.  Negative for cough, shortness of breath and stridor.   Cardiovascular: Negative.  Negative for chest pain, palpitations and leg swelling.  Gastrointestinal: Negative.  Negative for abdominal pain, constipation, diarrhea, heartburn, nausea and vomiting.  Genitourinary: Negative.  Negative for dysuria and flank pain.  Musculoskeletal: Negative.  Negative for joint pain and  myalgias.  Skin: Negative.   Neurological: Negative.  Negative for dizziness and headaches.  Endo/Heme/Allergies: Negative.   Psychiatric/Behavioral: Negative.  Negative for depression and suicidal ideas. The patient is not nervous/anxious.        Objective:   BP (!) 140/70   Pulse 71   Ht 5\' 2"  (1.575 m)   Wt 192 lb 6.4 oz (87.3 kg)   SpO2 96%   BMI 35.19 kg/m   Vitals:   03/08/23 1126  BP: (!) 140/70  Pulse: 71  Height: 5\' 2"  (1.575 m)  Weight: 192 lb 6.4 oz (87.3 kg)  SpO2: 96%  BMI (Calculated): 35.18    Physical Exam Vitals and nursing note reviewed.  Constitutional:      Appearance: Normal appearance.  HENT:     Head: Normocephalic and atraumatic.     Nose: Nose normal.     Mouth/Throat:     Mouth: Mucous membranes are moist.     Pharynx: Oropharynx is clear.  Eyes:     Conjunctiva/sclera: Conjunctivae normal.     Pupils: Pupils are equal, round, and reactive to light.  Cardiovascular:     Rate and Rhythm: Normal rate and regular rhythm.     Pulses: Normal pulses.     Heart sounds: Normal heart sounds. No murmur heard. Pulmonary:     Effort: Pulmonary effort is normal.     Breath sounds: Normal breath sounds. No wheezing.  Abdominal:     General: Bowel sounds are normal.     Palpations: Abdomen is soft.     Tenderness: There is no abdominal tenderness. There is no right CVA tenderness or left CVA tenderness.  Musculoskeletal:        General: Normal range of motion.     Cervical back: Normal range of motion.     Right lower leg: No edema.     Left lower leg: No edema.  Skin:    General: Skin is warm and dry.  Neurological:     General: No focal deficit present.     Mental Status: She is alert and oriented to person, place, and time.  Psychiatric:        Mood and Affect: Mood normal.        Behavior: Behavior normal.      No results found for any visits on 03/08/23.  Recent Results (from the past 2160 hour(s))  CBC with Diff     Status:  Abnormal   Collection Time: 02/11/23 11:58 AM  Result Value Ref Range   WBC 3.9 3.4 - 10.8 x10E3/uL   RBC 4.67 3.77 - 5.28 x10E6/uL   Hemoglobin 11.6 11.1 - 15.9 g/dL   Hematocrit 69.6 29.5 - 46.6 %   MCV 77 (L) 79 - 97 fL  MCH 24.8 (L) 26.6 - 33.0 pg   MCHC 32.4 31.5 - 35.7 g/dL   RDW 08.6 57.8 - 46.9 %   Platelets 219 150 - 450 x10E3/uL   Neutrophils 50 Not Estab. %   Lymphs 36 Not Estab. %   Monocytes 11 Not Estab. %   Eos 2 Not Estab. %   Basos 1 Not Estab. %   Neutrophils Absolute 2.0 1.4 - 7.0 x10E3/uL   Lymphocytes Absolute 1.4 0.7 - 3.1 x10E3/uL   Monocytes Absolute 0.4 0.1 - 0.9 x10E3/uL   EOS (ABSOLUTE) 0.1 0.0 - 0.4 x10E3/uL   Basophils Absolute 0.0 0.0 - 0.2 x10E3/uL   Immature Granulocytes 0 Not Estab. %   Immature Grans (Abs) 0.0 0.0 - 0.1 x10E3/uL  Lipid Panel w/o Chol/HDL Ratio     Status: Abnormal   Collection Time: 02/11/23 11:58 AM  Result Value Ref Range   Cholesterol, Total 177 100 - 199 mg/dL   Triglycerides 629 0 - 149 mg/dL   HDL 53 >52 mg/dL   VLDL Cholesterol Cal 21 5 - 40 mg/dL   LDL Chol Calc (NIH) 841 (H) 0 - 99 mg/dL  LKG40+NUUV     Status: Abnormal   Collection Time: 02/11/23 11:58 AM  Result Value Ref Range   Glucose 120 (H) 70 - 99 mg/dL   BUN 14 8 - 27 mg/dL   Creatinine, Ser 2.53 0.57 - 1.00 mg/dL   eGFR 76 >66 YQ/IHK/7.42   BUN/Creatinine Ratio 18 12 - 28   Sodium 143 134 - 144 mmol/L   Potassium 3.5 3.5 - 5.2 mmol/L   Chloride 101 96 - 106 mmol/L   CO2 26 20 - 29 mmol/L   Calcium 9.5 8.7 - 10.3 mg/dL   Total Protein 7.4 6.0 - 8.5 g/dL   Albumin 4.1 3.7 - 4.7 g/dL   Globulin, Total 3.3 1.5 - 4.5 g/dL   Bilirubin Total 0.6 0.0 - 1.2 mg/dL   Alkaline Phosphatase 83 44 - 121 IU/L   AST 17 0 - 40 IU/L   ALT 15 0 - 32 IU/L  Hemoglobin A1c     Status: Abnormal   Collection Time: 02/11/23 11:58 AM  Result Value Ref Range   Hgb A1c MFr Bld 6.6 (H) 4.8 - 5.6 %    Comment:          Prediabetes: 5.7 - 6.4          Diabetes: >6.4           Glycemic control for adults with diabetes: <7.0    Est. average glucose Bld gHb Est-mCnc 143 mg/dL      Assessment & Plan:  Schedule DEXA scan.  Continue all medications as such.  Monitor blood pressure. Problem List Items Addressed This Visit     Mixed hyperlipidemia   Essential hypertension, benign - Primary   Other Visit Diagnoses     Screening for osteoporosis       Relevant Orders   DG Bone Density   Prediabetes       Other iron deficiency anemia           Return in about 1 month (around 04/07/2023).   Total time spent: 25 minutes  Margaretann Loveless, MD  03/08/2023   This document may have been prepared by [facility] Voice Recognition software and as such may include unintentional dictation errors.

## 2023-04-02 ENCOUNTER — Encounter: Payer: Self-pay | Admitting: Internal Medicine

## 2023-04-02 ENCOUNTER — Ambulatory Visit (INDEPENDENT_AMBULATORY_CARE_PROVIDER_SITE_OTHER): Payer: Medicare Other | Admitting: Internal Medicine

## 2023-04-02 ENCOUNTER — Other Ambulatory Visit (INDEPENDENT_AMBULATORY_CARE_PROVIDER_SITE_OTHER): Payer: Medicare Other

## 2023-04-02 VITALS — BP 130/80 | HR 77 | Ht 62.0 in | Wt 188.6 lb

## 2023-04-02 DIAGNOSIS — E782 Mixed hyperlipidemia: Secondary | ICD-10-CM | POA: Diagnosis not present

## 2023-04-02 DIAGNOSIS — Z0000 Encounter for general adult medical examination without abnormal findings: Secondary | ICD-10-CM | POA: Diagnosis not present

## 2023-04-02 DIAGNOSIS — I10 Essential (primary) hypertension: Secondary | ICD-10-CM | POA: Diagnosis not present

## 2023-04-02 DIAGNOSIS — Z13820 Encounter for screening for osteoporosis: Secondary | ICD-10-CM | POA: Diagnosis not present

## 2023-04-02 DIAGNOSIS — Z7983 Long term (current) use of bisphosphonates: Secondary | ICD-10-CM | POA: Diagnosis not present

## 2023-04-02 DIAGNOSIS — D508 Other iron deficiency anemias: Secondary | ICD-10-CM | POA: Diagnosis not present

## 2023-04-02 DIAGNOSIS — H8113 Benign paroxysmal vertigo, bilateral: Secondary | ICD-10-CM | POA: Diagnosis not present

## 2023-04-02 DIAGNOSIS — R7303 Prediabetes: Secondary | ICD-10-CM | POA: Diagnosis not present

## 2023-04-02 DIAGNOSIS — H401130 Primary open-angle glaucoma, bilateral, stage unspecified: Secondary | ICD-10-CM | POA: Diagnosis not present

## 2023-04-02 DIAGNOSIS — D649 Anemia, unspecified: Secondary | ICD-10-CM | POA: Insufficient documentation

## 2023-04-02 NOTE — Progress Notes (Signed)
Established Patient Office Visit  Subjective:  Patient ID: Tammy Hendricks, female    DOB: 06/10/1935  Age: 87 y.o. MRN: [id number]  Chief Complaint  Patient presents with   Annual Exam    AWV    Patient comes in for her annual wellness visit.  She is feeling well today and has no new complaints.  Her blood pressure is also looking better.  Patient is up-to-date on her mammogram.  Today she had a DEXA scan which was normal.  She does not want Cologuard colon cancer screening. Her PHQ-9/GAD score is 0. CFS score is 4.    No other concerns at this time.   Past Medical History:  Diagnosis Date   Arthritis    right knee   Hypertension    Pre-diabetes    Sciatica    left side - occasional   Vertigo    sporadic   Wears dentures    partial upper, full lower    Past Surgical History:  Procedure Laterality Date   ABDOMINAL HYSTERECTOMY     APPENDECTOMY     CATARACT EXTRACTION W/PHACO Right 11/09/2015   Procedure: CATARACT EXTRACTION PHACO AND INTRAOCULAR LENS PLACEMENT (IOC) right eye;  Surgeon: Lockie Mola, MD;  Location: [facility];  Service: Ophthalmology;  Laterality: Right;   CATARACT EXTRACTION W/PHACO Left 12/03/2017   Procedure: CATARACT EXTRACTION PHACO AND INTRAOCULAR LENS PLACEMENT (IOC) LEFT EYE;  Surgeon: Lockie Mola, MD;  Location: [facility];  Service: Ophthalmology;  Laterality: Left;   CHOLECYSTECTOMY     COLONOSCOPY     OOPHORECTOMY     TONSILLECTOMY      Social History   Socioeconomic History   Marital status: Married    Spouse name: Not on file   Number of children: Not on file   Years of education: Not on file   Highest education level: Not on file  Occupational History   Not on file  Tobacco Use   Smoking status: Never   Smokeless tobacco: Never  Vaping Use   Vaping status: Never Used  Substance and Sexual Activity   Alcohol use: No   Drug use: Not on file   Sexual activity: Not on file  Other Topics  Concern   Not on file  Social History Narrative   Not on file   Social Determinants of Health   Financial Resource Strain: Low Risk  (04/02/2023)   Overall Financial Resource Strain (CARDIA)    Difficulty of Paying Living Expenses: Not very hard  Food Insecurity: No Food Insecurity (04/02/2023)   Hunger Vital Sign    Worried About Running Out of Food in the Last Year: Never true    Ran Out of Food in the Last Year: Never true  Transportation Needs: No Transportation Needs (04/02/2023)   PRAPARE - Administrator, Civil Service (Medical): No    Lack of Transportation (Non-Medical): No  Physical Activity: Not on file  Stress: Not on file  Social Connections: Not on file  Intimate Partner Violence: Not on file    Family History  Problem Relation Age of Onset   Dementia Mother    Hypertension Father     No Known Allergies  Outpatient Medications Prior to Visit  Medication Sig   acetaminophen (TYLENOL) 650 MG CR tablet Take 650 mg by mouth every 8 (eight) hours as needed for pain.   aspirin EC 81 MG tablet Take 81 mg by mouth daily. Swallow whole.   brimonidine (ALPHAGAN) 0.2 %  ophthalmic solution Place 1 drop into both eyes 2 (two) times daily.   hydrochlorothiazide (HYDRODIURIL) 25 MG tablet TAKE 1 TABLET BY MOUTH ONCE  DAILY   Krill Oil 500 MG CAPS Take 1 capsule by mouth daily.   latanoprost (XALATAN) 0.005 % ophthalmic solution Place 1 drop into both eyes at bedtime.   meloxicam (MOBIC) 15 MG tablet Take 15 mg by mouth daily.   potassium chloride SA (KLOR-CON M20) 20 MEQ tablet Take 1 tablet (20 mEq total) by mouth daily.   timolol (TIMOPTIC) 0.5 % ophthalmic solution Administer 1 drop to both eyes every morning.   No facility-administered medications prior to visit.    Review of Systems  Constitutional: Negative.  Negative for chills, fever, malaise/fatigue and weight loss.  HENT: Negative.  Negative for congestion, ear discharge, hearing loss and sinus  pain.   Eyes: Negative.   Respiratory: Negative.  Negative for cough, shortness of breath and stridor.   Cardiovascular: Negative.  Negative for chest pain, palpitations and leg swelling.  Gastrointestinal: Negative.  Negative for abdominal pain, constipation, diarrhea, heartburn, nausea and vomiting.  Genitourinary: Negative.  Negative for dysuria and flank pain.  Musculoskeletal: Negative.  Negative for joint pain and myalgias.  Neurological: Negative.  Negative for dizziness, tingling, tremors, sensory change, focal weakness and headaches.  Endo/Heme/Allergies: Negative.   Psychiatric/Behavioral: Negative.  Negative for depression and suicidal ideas. The patient is not nervous/anxious.        Objective:   BP 130/80   Pulse 77   Ht 5\' 2"  (1.575 m)   Wt 188 lb 9.6 oz (85.5 kg)   SpO2 92%   BMI 34.50 kg/m   Vitals:   04/02/23 1425  BP: 130/80  Pulse: 77  Height: 5\' 2"  (1.575 m)  Weight: 188 lb 9.6 oz (85.5 kg)  SpO2: 92%  BMI (Calculated): 34.49    Physical Exam Vitals and nursing note reviewed.  Constitutional:      General: She is not in acute distress.    Appearance: Normal appearance.  HENT:     Head: Normocephalic and atraumatic.     Nose: Nose normal.     Mouth/Throat:     Mouth: Mucous membranes are moist.     Pharynx: Oropharynx is clear.  Eyes:     Conjunctiva/sclera: Conjunctivae normal.     Pupils: Pupils are equal, round, and reactive to light.  Cardiovascular:     Rate and Rhythm: Normal rate and regular rhythm.     Pulses: Normal pulses.     Heart sounds: Normal heart sounds. No murmur heard. Pulmonary:     Effort: Pulmonary effort is normal.     Breath sounds: Normal breath sounds. No wheezing.  Abdominal:     General: Bowel sounds are normal.     Palpations: Abdomen is soft.     Tenderness: There is no abdominal tenderness. There is no right CVA tenderness or left CVA tenderness.  Musculoskeletal:        General: Normal range of motion.      Cervical back: Normal range of motion.     Right lower leg: No edema.     Left lower leg: No edema.  Skin:    General: Skin is warm and dry.  Neurological:     General: No focal deficit present.     Mental Status: She is alert and oriented to person, place, and time.  Psychiatric:        Mood and Affect: Mood normal.  Behavior: Behavior normal.      No results found for any visits on 04/02/23.  Recent Results (from the past 2160 hour(s))  CBC with Diff     Status: Abnormal   Collection Time: 02/11/23 11:58 AM  Result Value Ref Range   WBC 3.9 3.4 - 10.8 x10E3/uL   RBC 4.67 3.77 - 5.28 x10E6/uL   Hemoglobin 11.6 11.1 - 15.9 g/dL   Hematocrit 16.1 09.6 - 46.6 %   MCV 77 (L) 79 - 97 fL   MCH 24.8 (L) 26.6 - 33.0 pg   MCHC 32.4 31.5 - 35.7 g/dL   RDW 04.5 40.9 - 81.1 %   Platelets 219 150 - 450 x10E3/uL   Neutrophils 50 Not Estab. %   Lymphs 36 Not Estab. %   Monocytes 11 Not Estab. %   Eos 2 Not Estab. %   Basos 1 Not Estab. %   Neutrophils Absolute 2.0 1.4 - 7.0 x10E3/uL   Lymphocytes Absolute 1.4 0.7 - 3.1 x10E3/uL   Monocytes Absolute 0.4 0.1 - 0.9 x10E3/uL   EOS (ABSOLUTE) 0.1 0.0 - 0.4 x10E3/uL   Basophils Absolute 0.0 0.0 - 0.2 x10E3/uL   Immature Granulocytes 0 Not Estab. %   Immature Grans (Abs) 0.0 0.0 - 0.1 x10E3/uL  Lipid Panel w/o Chol/HDL Ratio     Status: Abnormal   Collection Time: 02/11/23 11:58 AM  Result Value Ref Range   Cholesterol, Total 177 100 - 199 mg/dL   Triglycerides 914 0 - 149 mg/dL   HDL 53 >78 mg/dL   VLDL Cholesterol Cal 21 5 - 40 mg/dL   LDL Chol Calc (NIH) 295 (H) 0 - 99 mg/dL  AOZ30+QMVH     Status: Abnormal   Collection Time: 02/11/23 11:58 AM  Result Value Ref Range   Glucose 120 (H) 70 - 99 mg/dL   BUN 14 8 - 27 mg/dL   Creatinine, Ser 8.46 0.57 - 1.00 mg/dL   eGFR 76 >96 EX/BMW/4.13   BUN/Creatinine Ratio 18 12 - 28   Sodium 143 134 - 144 mmol/L   Potassium 3.5 3.5 - 5.2 mmol/L   Chloride 101 96 - 106 mmol/L   CO2  26 20 - 29 mmol/L   Calcium 9.5 8.7 - 10.3 mg/dL   Total Protein 7.4 6.0 - 8.5 g/dL   Albumin 4.1 3.7 - 4.7 g/dL   Globulin, Total 3.3 1.5 - 4.5 g/dL   Bilirubin Total 0.6 0.0 - 1.2 mg/dL   Alkaline Phosphatase 83 44 - 121 IU/L   AST 17 0 - 40 IU/L   ALT 15 0 - 32 IU/L  Hemoglobin A1c     Status: Abnormal   Collection Time: 02/11/23 11:58 AM  Result Value Ref Range   Hgb A1c MFr Bld 6.6 (H) 4.8 - 5.6 %    Comment:          Prediabetes: 5.7 - 6.4          Diabetes: >6.4          Glycemic control for adults with diabetes: <7.0    Est. average glucose Bld gHb Est-mCnc 143 mg/dL      Assessment & Plan:  Patient advised to continue all her medications. Problem List Items Addressed This Visit     Mixed hyperlipidemia   Essential hypertension, benign   Benign paroxysmal positional vertigo due to bilateral vestibular disorder   Primary open angle glaucoma (POAG) of both eyes   Prediabetes   Absolute anemia   Other  Visit Diagnoses     Medicare annual wellness visit, subsequent    -  Primary       Return in about 3 months (around 07/01/2023).   Total time spent: 30 minutes  Margaretann Loveless, MD  04/02/2023   This document may have been prepared by [facility] Voice Recognition software and as such may include unintentional dictation errors.

## 2023-04-21 ENCOUNTER — Other Ambulatory Visit: Payer: Self-pay | Admitting: Internal Medicine

## 2023-04-21 DIAGNOSIS — E876 Hypokalemia: Secondary | ICD-10-CM

## 2023-07-01 ENCOUNTER — Ambulatory Visit: Payer: Medicare Other | Admitting: Internal Medicine

## 2023-07-05 ENCOUNTER — Encounter: Payer: Self-pay | Admitting: Internal Medicine

## 2023-07-05 ENCOUNTER — Ambulatory Visit (INDEPENDENT_AMBULATORY_CARE_PROVIDER_SITE_OTHER): Admitting: Internal Medicine

## 2023-07-05 VITALS — BP 150/90 | HR 86 | Ht 62.0 in | Wt 185.0 lb

## 2023-07-05 DIAGNOSIS — H8113 Benign paroxysmal vertigo, bilateral: Secondary | ICD-10-CM | POA: Diagnosis not present

## 2023-07-05 DIAGNOSIS — E782 Mixed hyperlipidemia: Secondary | ICD-10-CM

## 2023-07-05 DIAGNOSIS — I10 Essential (primary) hypertension: Secondary | ICD-10-CM

## 2023-07-05 DIAGNOSIS — R7303 Prediabetes: Secondary | ICD-10-CM

## 2023-07-05 DIAGNOSIS — D508 Other iron deficiency anemias: Secondary | ICD-10-CM | POA: Diagnosis not present

## 2023-07-05 NOTE — Progress Notes (Signed)
 Established Patient Office Visit  Subjective:  Patient ID: Tammy Hendricks, female    DOB: 04/28/1935  Age: 88 y.o. MRN: [id number]  Chief Complaint  Patient presents with   Follow-up    3 month follow up    Patient is here for her follow-up.  Her blood pressure is elevated, but she is unsure if she took her medications or not.  She did bring her OTC medications in a bag.  Her family members help her travel to her appointments.  She denies any headaches or dizziness, no chest pain and no shortness of breath.  She is due for blood work today.  Patient advised to bring back her medications at her next visit, which will be in 2 weeks.    No other concerns at this time.   Past Medical History:  Diagnosis Date   Arthritis    right knee   Hypertension    Pre-diabetes    Sciatica    left side - occasional   Vertigo    sporadic   Wears dentures    partial upper, full lower    Past Surgical History:  Procedure Laterality Date   ABDOMINAL HYSTERECTOMY     APPENDECTOMY     CATARACT EXTRACTION W/PHACO Right 11/09/2015   Procedure: CATARACT EXTRACTION PHACO AND INTRAOCULAR LENS PLACEMENT (IOC) right eye;  Surgeon: Lockie Mola, MD;  Location: [facility];  Service: Ophthalmology;  Laterality: Right;   CATARACT EXTRACTION W/PHACO Left 12/03/2017   Procedure: CATARACT EXTRACTION PHACO AND INTRAOCULAR LENS PLACEMENT (IOC) LEFT EYE;  Surgeon: Lockie Mola, MD;  Location: [facility];  Service: Ophthalmology;  Laterality: Left;   CHOLECYSTECTOMY     COLONOSCOPY     OOPHORECTOMY     TONSILLECTOMY      Social History   Socioeconomic History   Marital status: Married    Spouse name: Not on file   Number of children: Not on file   Years of education: Not on file   Highest education level: Not on file  Occupational History   Not on file  Tobacco Use   Smoking status: Never   Smokeless tobacco: Never  Vaping Use   Vaping status: Never Used   Substance and Sexual Activity   Alcohol use: No   Drug use: Not on file   Sexual activity: Not on file  Other Topics Concern   Not on file  Social History Narrative   Not on file   Social Drivers of Health   Financial Resource Strain: Low Risk  (04/02/2023)   Overall Financial Resource Strain (CARDIA)    Difficulty of Paying Living Expenses: Not very hard  Food Insecurity: No Food Insecurity (04/02/2023)   Hunger Vital Sign    Worried About Running Out of Food in the Last Year: Never true    Ran Out of Food in the Last Year: Never true  Transportation Needs: No Transportation Needs (04/02/2023)   PRAPARE - Administrator, Civil Service (Medical): No    Lack of Transportation (Non-Medical): No  Physical Activity: Not on file  Stress: Not on file  Social Connections: Not on file  Intimate Partner Violence: Not on file    Family History  Problem Relation Age of Onset   Dementia Mother    Hypertension Father     No Known Allergies  Outpatient Medications Prior to Visit  Medication Sig   acetaminophen (TYLENOL) 650 MG CR tablet Take 650 mg by mouth every 8 (eight) hours  as needed for pain.   aspirin EC 81 MG tablet Take 81 mg by mouth daily. Swallow whole.   brimonidine (ALPHAGAN) 0.2 % ophthalmic solution Place 1 drop into both eyes 2 (two) times daily.   Krill Oil 500 MG CAPS Take 1 capsule by mouth daily.   latanoprost (XALATAN) 0.005 % ophthalmic solution Place 1 drop into both eyes at bedtime.   timolol (TIMOPTIC) 0.5 % ophthalmic solution Administer 1 drop to both eyes every morning.   hydrochlorothiazide (HYDRODIURIL) 25 MG tablet TAKE 1 TABLET BY MOUTH ONCE  DAILY (Patient not taking: Reported on 07/05/2023)   meloxicam (MOBIC) 15 MG tablet Take 15 mg by mouth daily. (Patient not taking: Reported on 07/05/2023)   potassium chloride SA (KLOR-CON M) 20 MEQ tablet TAKE 1 TABLET BY MOUTH DAILY (Patient not taking: Reported on 07/05/2023)   No  facility-administered medications prior to visit.    Review of Systems  Constitutional: Negative.  Negative for chills, diaphoresis, fever, malaise/fatigue and weight loss.  HENT: Negative.  Negative for sore throat.   Eyes: Negative.   Respiratory: Negative.  Negative for cough and shortness of breath.   Cardiovascular: Negative.  Negative for chest pain, palpitations and leg swelling.  Gastrointestinal: Negative.  Negative for abdominal pain, constipation, diarrhea, heartburn, nausea and vomiting.  Genitourinary: Negative.  Negative for dysuria and flank pain.  Musculoskeletal: Negative.  Negative for joint pain and myalgias.  Skin: Negative.   Neurological: Negative.  Negative for dizziness, tingling, tremors and headaches.  Endo/Heme/Allergies: Negative.   Psychiatric/Behavioral: Negative.  Negative for depression and suicidal ideas. The patient is not nervous/anxious.        Objective:   BP (!) 150/90   Pulse 86   Ht 5\' 2"  (1.575 m)   Wt 185 lb (83.9 kg)   SpO2 95%   BMI 33.84 kg/m   Vitals:   07/05/23 1507  BP: (!) 150/90  Pulse: 86  Height: 5\' 2"  (1.575 m)  Weight: 185 lb (83.9 kg)  SpO2: 95%  BMI (Calculated): 33.83    Physical Exam Vitals and nursing note reviewed.  Constitutional:      Appearance: Normal appearance.  HENT:     Head: Normocephalic and atraumatic.     Nose: Nose normal.     Mouth/Throat:     Mouth: Mucous membranes are moist.     Pharynx: Oropharynx is clear.  Eyes:     Conjunctiva/sclera: Conjunctivae normal.     Pupils: Pupils are equal, round, and reactive to light.  Cardiovascular:     Rate and Rhythm: Normal rate and regular rhythm.     Pulses: Normal pulses.     Heart sounds: Normal heart sounds. No murmur heard. Pulmonary:     Effort: Pulmonary effort is normal.     Breath sounds: Normal breath sounds. No wheezing.  Abdominal:     General: Bowel sounds are normal.     Palpations: Abdomen is soft.     Tenderness: There is  no abdominal tenderness. There is no right CVA tenderness or left CVA tenderness.  Musculoskeletal:        General: Normal range of motion.     Cervical back: Normal range of motion.     Right lower leg: No edema.     Left lower leg: No edema.  Skin:    General: Skin is warm and dry.  Neurological:     General: No focal deficit present.     Mental Status: She is alert and oriented  to person, place, and time.  Psychiatric:        Mood and Affect: Mood normal.        Behavior: Behavior normal.      No results found for any visits on 07/05/23.  No results found for this or any previous visit (from the past 2160 hours).    Assessment & Plan:  Continue her current medications.  Check blood work today.  Patient advised to bring in her medications at her next visit. Problem List Items Addressed This Visit     Mixed hyperlipidemia   Relevant Orders   Lipid Panel w/o Chol/HDL Ratio   Essential hypertension, benign - Primary   Relevant Orders   CMP14+EGFR   Benign paroxysmal positional vertigo due to bilateral vestibular disorder   Prediabetes   Relevant Orders   Hemoglobin A1c   Absolute anemia   Relevant Orders   CBC with Diff    Return in about 2 weeks (around 07/19/2023).   Total time spent: 25 minutes  Margaretann Loveless, MD  07/05/2023   This document may have been prepared by [facility] Voice Recognition software and as such may include unintentional dictation errors.

## 2023-07-06 LAB — CMP14+EGFR
ALT: 22 IU/L (ref 0–32)
AST: 20 IU/L (ref 0–40)
Albumin: 4.1 g/dL (ref 3.7–4.7)
Alkaline Phosphatase: 78 IU/L (ref 44–121)
BUN/Creatinine Ratio: 15 (ref 12–28)
BUN: 11 mg/dL (ref 8–27)
Bilirubin Total: 0.5 mg/dL (ref 0.0–1.2)
CO2: 27 mmol/L (ref 20–29)
Calcium: 9.4 mg/dL (ref 8.7–10.3)
Chloride: 100 mmol/L (ref 96–106)
Creatinine, Ser: 0.71 mg/dL (ref 0.57–1.00)
Globulin, Total: 3.2 g/dL (ref 1.5–4.5)
Glucose: 104 mg/dL — ABNORMAL HIGH (ref 70–99)
Potassium: 3.2 mmol/L — ABNORMAL LOW (ref 3.5–5.2)
Sodium: 142 mmol/L (ref 134–144)
Total Protein: 7.3 g/dL (ref 6.0–8.5)
eGFR: 83 mL/min/{1.73_m2} (ref >59)

## 2023-07-06 LAB — CBC WITH DIFFERENTIAL/PLATELET
Basophils Absolute: 0 10*3/uL (ref 0.0–0.2)
Basos: 0 %
EOS (ABSOLUTE): 0.1 10*3/uL (ref 0.0–0.4)
Eos: 1 %
Hematocrit: 35.2 % (ref 34.0–46.6)
Hemoglobin: 11.2 g/dL (ref 11.1–15.9)
Immature Grans (Abs): 0 10*3/uL (ref 0.0–0.1)
Immature Granulocytes: 0 %
Lymphocytes Absolute: 2.1 10*3/uL (ref 0.7–3.1)
Lymphs: 50 %
MCH: 24.3 pg — ABNORMAL LOW (ref 26.6–33.0)
MCHC: 31.8 g/dL (ref 31.5–35.7)
MCV: 77 fL — ABNORMAL LOW (ref 79–97)
Monocytes Absolute: 0.5 10*3/uL (ref 0.1–0.9)
Monocytes: 11 %
Neutrophils Absolute: 1.6 10*3/uL (ref 1.4–7.0)
Neutrophils: 38 %
Platelets: 210 10*3/uL (ref 150–450)
RBC: 4.6 x10E6/uL (ref 3.77–5.28)
RDW: 14.7 % (ref 11.7–15.4)
WBC: 4.2 10*3/uL (ref 3.4–10.8)

## 2023-07-06 LAB — LIPID PANEL W/O CHOL/HDL RATIO
Cholesterol, Total: 164 mg/dL (ref 100–199)
HDL: 55 mg/dL (ref >39)
LDL Chol Calc (NIH): 92 mg/dL (ref 0–99)
Triglycerides: 94 mg/dL (ref 0–149)
VLDL Cholesterol Cal: 17 mg/dL (ref 5–40)

## 2023-07-06 LAB — HEMOGLOBIN A1C
Est. average glucose Bld gHb Est-mCnc: 137 mg/dL
Hgb A1c MFr Bld: 6.4 % — ABNORMAL HIGH (ref 4.8–5.6)

## 2023-07-08 ENCOUNTER — Other Ambulatory Visit: Payer: Self-pay | Admitting: Internal Medicine

## 2023-07-08 DIAGNOSIS — E876 Hypokalemia: Secondary | ICD-10-CM

## 2023-07-08 MED ORDER — POTASSIUM CHLORIDE CRYS ER 20 MEQ PO TBCR: 20.0000 meq | EXTENDED_RELEASE_TABLET | Freq: Every day | ORAL | 2 refills | Status: DC

## 2023-07-10 NOTE — Progress Notes (Signed)
 Patient notified

## 2023-07-12 ENCOUNTER — Other Ambulatory Visit: Payer: Self-pay

## 2023-07-12 DIAGNOSIS — E876 Hypokalemia: Secondary | ICD-10-CM

## 2023-07-12 MED ORDER — POTASSIUM CHLORIDE CRYS ER 20 MEQ PO TBCR: 20.0000 meq | EXTENDED_RELEASE_TABLET | Freq: Every day | ORAL | 2 refills | Status: DC

## 2023-07-19 ENCOUNTER — Encounter: Payer: Self-pay | Admitting: Internal Medicine

## 2023-07-19 ENCOUNTER — Ambulatory Visit: Admitting: Internal Medicine

## 2023-07-19 VITALS — BP 170/70 | HR 64 | Ht 62.0 in | Wt 183.6 lb

## 2023-07-19 DIAGNOSIS — I10 Essential (primary) hypertension: Secondary | ICD-10-CM

## 2023-07-19 DIAGNOSIS — E782 Mixed hyperlipidemia: Secondary | ICD-10-CM

## 2023-07-19 DIAGNOSIS — R7303 Prediabetes: Secondary | ICD-10-CM

## 2023-07-19 MED ORDER — LISINOPRIL 5 MG PO TABS: 5.0000 mg | ORAL_TABLET | Freq: Every day | ORAL | 5 refills | Status: DC

## 2023-07-19 NOTE — Progress Notes (Signed)
 Established Patient Office Visit  Subjective:  Patient ID: Tammy Hendricks, female    DOB: 11/26/1935  Age: 88 y.o. MRN: [id number]  Chief Complaint  Patient presents with   Follow-up    2 week follow up    Patient comes in for her follow-up today.  Her blood pressure is even higher than before.  Patient brought in all her medications and she is only taking hydrochlorothiazide 25 mg/day.  Back in 2022 she was tried on other medications like amlodipine, Aldactone, lisinopril but she was unable to tolerate them and her blood pressure would keep dropping.  Since then she was being maintained on hydrochlorothiazide 25 mg/day.  Now her blood pressure is remaining very high.  Patient agrees to start a small dose of lisinopril 5 mg/day and monitor blood pressure at home.  New prescription sent.  Labs were done at last visit and the results discussed.  She was started on potassium supplement.    No other concerns at this time.   Past Medical History:  Diagnosis Date   Arthritis    right knee   Hypertension    Pre-diabetes    Sciatica    left side - occasional   Vertigo    sporadic   Wears dentures    partial upper, full lower    Past Surgical History:  Procedure Laterality Date   ABDOMINAL HYSTERECTOMY     APPENDECTOMY     CATARACT EXTRACTION W/PHACO Right 11/09/2015   Procedure: CATARACT EXTRACTION PHACO AND INTRAOCULAR LENS PLACEMENT (IOC) right eye;  Surgeon: Lockie Mola, MD;  Location: [facility];  Service: Ophthalmology;  Laterality: Right;   CATARACT EXTRACTION W/PHACO Left 12/03/2017   Procedure: CATARACT EXTRACTION PHACO AND INTRAOCULAR LENS PLACEMENT (IOC) LEFT EYE;  Surgeon: Lockie Mola, MD;  Location: [facility];  Service: Ophthalmology;  Laterality: Left;   CHOLECYSTECTOMY     COLONOSCOPY     OOPHORECTOMY     TONSILLECTOMY      Social History   Socioeconomic History   Marital status: Married    Spouse name: Not on file    Number of children: Not on file   Years of education: Not on file   Highest education level: Not on file  Occupational History   Not on file  Tobacco Use   Smoking status: Never   Smokeless tobacco: Never  Vaping Use   Vaping status: Never Used  Substance and Sexual Activity   Alcohol use: No   Drug use: Not on file   Sexual activity: Not on file  Other Topics Concern   Not on file  Social History Narrative   Not on file   Social Drivers of Health   Financial Resource Strain: Low Risk  (04/02/2023)   Overall Financial Resource Strain (CARDIA)    Difficulty of Paying Living Expenses: Not very hard  Food Insecurity: No Food Insecurity (04/02/2023)   Hunger Vital Sign    Worried About Running Out of Food in the Last Year: Never true    Ran Out of Food in the Last Year: Never true  Transportation Needs: No Transportation Needs (04/02/2023)   PRAPARE - Administrator, Civil Service (Medical): No    Lack of Transportation (Non-Medical): No  Physical Activity: Not on file  Stress: Not on file  Social Connections: Not on file  Intimate Partner Violence: Not on file    Family History  Problem Relation Age of Onset   Dementia Mother  Hypertension Father     No Known Allergies  Outpatient Medications Prior to Visit  Medication Sig   acetaminophen (TYLENOL) 650 MG CR tablet Take 650 mg by mouth every 8 (eight) hours as needed for pain.   aspirin EC 81 MG tablet Take 81 mg by mouth daily. Swallow whole.   brimonidine (ALPHAGAN) 0.2 % ophthalmic solution Place 1 drop into both eyes 2 (two) times daily.   hydrochlorothiazide (HYDRODIURIL) 25 MG tablet TAKE 1 TABLET BY MOUTH ONCE  DAILY   Krill Oil 500 MG CAPS Take 1 capsule by mouth daily.   latanoprost (XALATAN) 0.005 % ophthalmic solution Place 1 drop into both eyes at bedtime.   potassium chloride SA (KLOR-CON M) 20 MEQ tablet Take 1 tablet (20 mEq total) by mouth daily.   meloxicam (MOBIC) 15 MG tablet Take 15  mg by mouth daily. (Patient not taking: Reported on 07/19/2023)   timolol (TIMOPTIC) 0.5 % ophthalmic solution Administer 1 drop to both eyes every morning. (Patient not taking: Reported on 07/19/2023)   No facility-administered medications prior to visit.    Review of Systems  Constitutional: Negative.  Negative for chills, diaphoresis, fever, malaise/fatigue and weight loss.  HENT: Negative.    Eyes: Negative.   Respiratory: Negative.  Negative for cough and shortness of breath.   Cardiovascular: Negative.  Negative for chest pain, palpitations and leg swelling.  Gastrointestinal: Negative.  Negative for abdominal pain, constipation, diarrhea, heartburn, nausea and vomiting.  Genitourinary: Negative.  Negative for dysuria and flank pain.  Musculoskeletal: Negative.  Negative for joint pain and myalgias.  Skin: Negative.   Neurological: Negative.  Negative for dizziness and headaches.  Endo/Heme/Allergies: Negative.   Psychiatric/Behavioral: Negative.  Negative for depression and suicidal ideas. The patient is not nervous/anxious.        Objective:   BP (!) 170/70   Pulse 64   Ht 5\' 2"  (1.575 m)   Wt 183 lb 9.6 oz (83.3 kg)   SpO2 96%   BMI 33.58 kg/m   Vitals:   07/19/23 1341  BP: (!) 170/70  Pulse: 64  Height: 5\' 2"  (1.575 m)  Weight: 183 lb 9.6 oz (83.3 kg)  SpO2: 96%  BMI (Calculated): 33.57    Physical Exam Vitals and nursing note reviewed.  Constitutional:      Appearance: Normal appearance.  HENT:     Head: Normocephalic and atraumatic.     Nose: Nose normal.     Mouth/Throat:     Mouth: Mucous membranes are moist.     Pharynx: Oropharynx is clear.  Eyes:     Conjunctiva/sclera: Conjunctivae normal.     Pupils: Pupils are equal, round, and reactive to light.  Cardiovascular:     Rate and Rhythm: Normal rate and regular rhythm.     Pulses: Normal pulses.     Heart sounds: Normal heart sounds. No murmur heard. Pulmonary:     Effort: Pulmonary effort is  normal.     Breath sounds: Normal breath sounds. No wheezing.  Abdominal:     General: Bowel sounds are normal.     Palpations: Abdomen is soft.     Tenderness: There is no abdominal tenderness. There is no right CVA tenderness or left CVA tenderness.  Musculoskeletal:        General: Normal range of motion.     Cervical back: Normal range of motion.     Right lower leg: No edema.     Left lower leg: No edema.  Skin:  General: Skin is warm and dry.  Neurological:     General: No focal deficit present.     Mental Status: She is alert and oriented to person, place, and time.  Psychiatric:        Mood and Affect: Mood normal.        Behavior: Behavior normal.      No results found for any visits on 07/19/23.  Recent Results (from the past 2160 hours)  CMP14+EGFR     Status: Abnormal   Collection Time: 07/05/23  3:38 PM  Result Value Ref Range   Glucose 104 (H) 70 - 99 mg/dL   BUN 11 8 - 27 mg/dL   Creatinine, Ser 1.61 0.57 - 1.00 mg/dL   eGFR 83 >09 UE/AVW/0.98   BUN/Creatinine Ratio 15 12 - 28   Sodium 142 134 - 144 mmol/L   Potassium 3.2 (L) 3.5 - 5.2 mmol/L   Chloride 100 96 - 106 mmol/L   CO2 27 20 - 29 mmol/L   Calcium 9.4 8.7 - 10.3 mg/dL   Total Protein 7.3 6.0 - 8.5 g/dL   Albumin 4.1 3.7 - 4.7 g/dL   Globulin, Total 3.2 1.5 - 4.5 g/dL   Bilirubin Total 0.5 0.0 - 1.2 mg/dL   Alkaline Phosphatase 78 44 - 121 IU/L   AST 20 0 - 40 IU/L   ALT 22 0 - 32 IU/L  Lipid Panel w/o Chol/HDL Ratio     Status: None   Collection Time: 07/05/23  3:38 PM  Result Value Ref Range   Cholesterol, Total 164 100 - 199 mg/dL   Triglycerides 94 0 - 149 mg/dL   HDL 55 >11 mg/dL   VLDL Cholesterol Cal 17 5 - 40 mg/dL   LDL Chol Calc (NIH) 92 0 - 99 mg/dL  CBC with Diff     Status: Abnormal   Collection Time: 07/05/23  3:38 PM  Result Value Ref Range   WBC 4.2 3.4 - 10.8 x10E3/uL   RBC 4.60 3.77 - 5.28 x10E6/uL   Hemoglobin 11.2 11.1 - 15.9 g/dL   Hematocrit 91.4 78.2 - 46.6 %    MCV 77 (L) 79 - 97 fL   MCH 24.3 (L) 26.6 - 33.0 pg   MCHC 31.8 31.5 - 35.7 g/dL   RDW 95.6 21.3 - 08.6 %   Platelets 210 150 - 450 x10E3/uL   Neutrophils 38 Not Estab. %   Lymphs 50 Not Estab. %   Monocytes 11 Not Estab. %   Eos 1 Not Estab. %   Basos 0 Not Estab. %   Neutrophils Absolute 1.6 1.4 - 7.0 x10E3/uL   Lymphocytes Absolute 2.1 0.7 - 3.1 x10E3/uL   Monocytes Absolute 0.5 0.1 - 0.9 x10E3/uL   EOS (ABSOLUTE) 0.1 0.0 - 0.4 x10E3/uL   Basophils Absolute 0.0 0.0 - 0.2 x10E3/uL   Immature Granulocytes 0 Not Estab. %   Immature Grans (Abs) 0.0 0.0 - 0.1 x10E3/uL  Hemoglobin A1c     Status: Abnormal   Collection Time: 07/05/23  3:38 PM  Result Value Ref Range   Hgb A1c MFr Bld 6.4 (H) 4.8 - 5.6 %    Comment:          Prediabetes: 5.7 - 6.4          Diabetes: >6.4          Glycemic control for adults with diabetes: <7.0    Est. average glucose Bld gHb Est-mCnc 137 mg/dL      Assessment &  Plan:  Patient advised to continue all her medications.  Add lisinopril 5 mg/day to hydrochlorothiazide.  Monitor blood pressure at home. Problem List Items Addressed This Visit     Mixed hyperlipidemia   Relevant Medications   lisinopril (ZESTRIL) 5 MG tablet   Essential hypertension, benign - Primary   Relevant Medications   lisinopril (ZESTRIL) 5 MG tablet   Prediabetes    Follow up in 3 weeks.  Total time spent: 25 minutes  Margaretann Loveless, MD  07/19/2023   This document may have been prepared by [facility] Voice Recognition software and as such may include unintentional dictation errors.

## 2023-07-25 ENCOUNTER — Other Ambulatory Visit: Payer: Self-pay

## 2023-07-25 ENCOUNTER — Emergency Department
Admission: EM | Admit: 2023-07-25 | Discharge: 2023-07-25 | Disposition: A | Discharge status: Home / Self Care | Attending: Emergency Medicine | Admitting: Emergency Medicine

## 2023-07-25 DIAGNOSIS — R410 Disorientation, unspecified: Secondary | ICD-10-CM | POA: Diagnosis not present

## 2023-07-25 DIAGNOSIS — N390 Urinary tract infection, site not specified: Secondary | ICD-10-CM | POA: Insufficient documentation

## 2023-07-25 DIAGNOSIS — R404 Transient alteration of awareness: Secondary | ICD-10-CM | POA: Diagnosis not present

## 2023-07-25 DIAGNOSIS — Z743 Need for continuous supervision: Secondary | ICD-10-CM | POA: Diagnosis not present

## 2023-07-25 DIAGNOSIS — R55 Syncope and collapse: Secondary | ICD-10-CM | POA: Insufficient documentation

## 2023-07-25 LAB — URINALYSIS, ROUTINE W REFLEX MICROSCOPIC
Bilirubin Urine: NEGATIVE
Glucose, UA: NEGATIVE mg/dL
Hgb urine dipstick: NEGATIVE
Ketones, ur: NEGATIVE mg/dL
Nitrite: NEGATIVE
Protein, ur: NEGATIVE mg/dL
Specific Gravity, Urine: 1.006 (ref 1.005–1.030)
pH: 8 (ref 5.0–8.0)

## 2023-07-25 LAB — TROPONIN I (HIGH SENSITIVITY)
Troponin I (High Sensitivity): 3 ng/L (ref <18)
Troponin I (High Sensitivity): 3 ng/L (ref <18)

## 2023-07-25 LAB — BASIC METABOLIC PANEL WITH GFR
Anion gap: 10 (ref 5–15)
BUN: 13 mg/dL (ref 8–23)
CO2: 26 mmol/L (ref 22–32)
Calcium: 9.4 mg/dL (ref 8.9–10.3)
Chloride: 104 mmol/L (ref 98–111)
Creatinine, Ser: 0.73 mg/dL (ref 0.44–1.00)
GFR, Estimated: >60 mL/min (ref >60)
Glucose, Bld: 111 mg/dL — ABNORMAL HIGH (ref 70–99)
Potassium: 3.7 mmol/L (ref 3.5–5.1)
Sodium: 140 mmol/L (ref 135–145)

## 2023-07-25 LAB — CBC
HCT: 33.8 % — ABNORMAL LOW (ref 36.0–46.0)
Hemoglobin: 10.9 g/dL — ABNORMAL LOW (ref 12.0–15.0)
MCH: 24.2 pg — ABNORMAL LOW (ref 26.0–34.0)
MCHC: 32.2 g/dL (ref 30.0–36.0)
MCV: 75.1 fL — ABNORMAL LOW (ref 80.0–100.0)
Platelets: 220 10*3/uL (ref 150–400)
RBC: 4.5 MIL/uL (ref 3.87–5.11)
RDW: 14.9 % (ref 11.5–15.5)
WBC: 3.8 10*3/uL — ABNORMAL LOW (ref 4.0–10.5)
nRBC: 0 % (ref 0.0–0.2)

## 2023-07-25 IMAGING — DX DG CHEST 1V PORT
1 series · 1 of 1 positions shown · non-contrast
Comparison: None.

CLINICAL DATA: None

EXAM:
PORTABLE CHEST 1 VIEW

[chest ap]
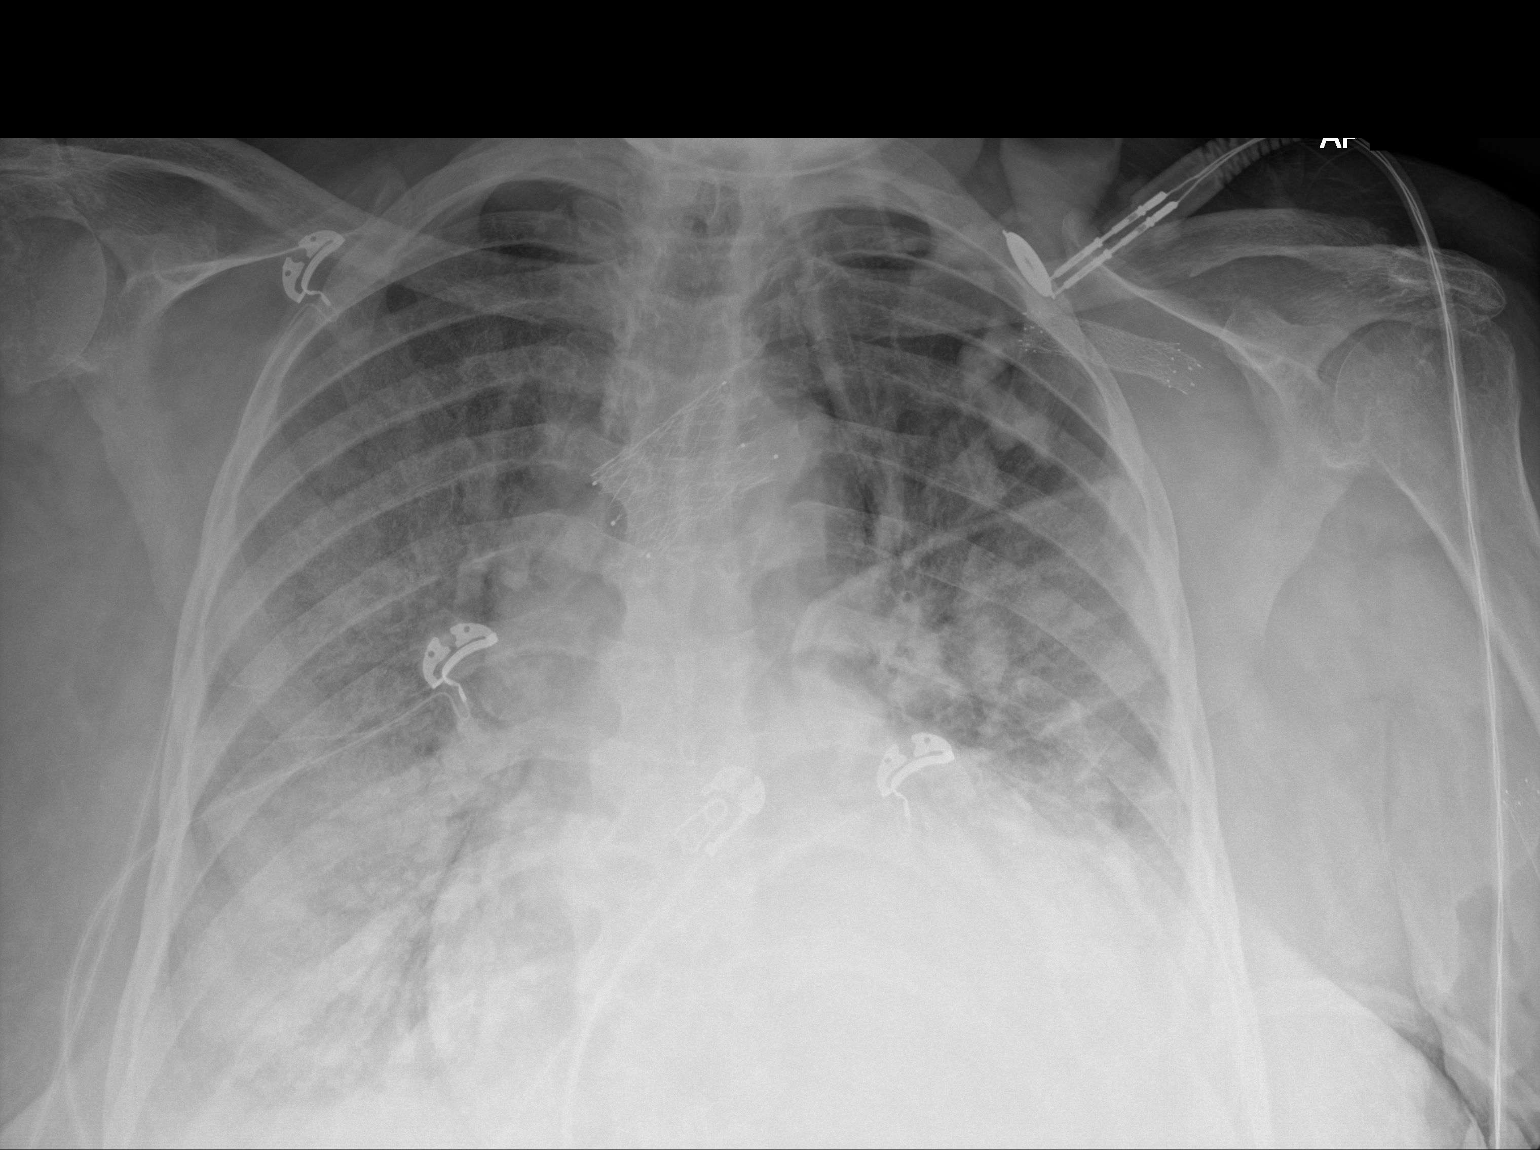

[1 of 1 positions shown; findings below may reference images not displayed]

FINDINGS: Inferior costophrenic angles are excluded from field of view.

Cardiac and mediastinal contours are within normal limits for AP
technique. Diffuse bilateral heterogeneous opacities. No large
pleural effusion or pneumothorax. Vascular stent overlying the upper
mediastinum.
IMPRESSION: Diffuse bilateral heterogeneous opacities, likely due to pulmonary
edema.

## 2023-07-25 MED ORDER — CEPHALEXIN 250 MG PO CAPS
250.0000 mg | ORAL_CAPSULE | Freq: Once | ORAL | Status: AC
  Administered 2023-07-25: 250 mg via ORAL
  Filled 2023-07-25: qty 1

## 2023-07-25 MED ORDER — SODIUM CHLORIDE 0.9 % IV BOLUS
500.0000 mL | Freq: Once | INTRAVENOUS | Status: AC
  Administered 2023-07-25: 500 mL via INTRAVENOUS

## 2023-07-25 MED ORDER — CEPHALEXIN 250 MG PO CAPS: 250.0000 mg | ORAL_CAPSULE | Freq: Three times a day (TID) | ORAL | 0 refills | Status: AC

## 2023-07-25 NOTE — ED Triage Notes (Signed)
 First Nurse Note;  Pt via [facility] from home. Pt c/o near syncopal episode. States daughter states that pt is more altered than normal.  Denies orthostatic changes. Denies pain Pt is A&OX4 and NAD but just slow to respond.  140/90 BP  97.5 oral  104 CBG

## 2023-07-25 NOTE — ED Triage Notes (Signed)
 Pt here with a near syncopal episode today. Pt denies pain or weakness. Pt denies fall or injury. Pt got dizzy while she was out, denies dizziness at the moment. Pt just started on potassium pills recently.

## 2023-07-25 NOTE — ED Provider Notes (Signed)
 [facility] Provider Note    Event Date/Time   First MD Initiated Contact with Patient 07/25/23 2028     (approximate)   History   Near Syncope   HPI  Tammy Hendricks is a 88 y.o. female who presents to the emergency department today because of concerns for a near syncopal episode.  The patient was sitting down when it happened.  It occurred this afternoon.  She is felt like she was get a pass out although she did not blackout.  She denies any concurrent chest pain or palpitations.  No shortness of breath.  Had a similar episode happen to her a couple of years ago and was evaluated for but denies being told any specific etiology.  The patient does state that she recently restarted the blood pressure medication about a week ago.  Denies any recent illness, fevers or chills.     Physical Exam   Triage Vital Signs: ED Triage Vitals  Encounter Vitals Group     BP 07/25/23 1830 (!) 175/81     Systolic BP Percentile --      Diastolic BP Percentile --      Pulse Rate 07/25/23 1830 65     Resp 07/25/23 1830 18     Temp 07/25/23 1830 97.7 F (36.5 C)     Temp Source 07/25/23 1830 Oral     SpO2 07/25/23 1830 100 %     Weight 07/25/23 1836 183 lb 10.3 oz (83.3 kg)     Height 07/25/23 1836 5\' 2"  (1.575 m)     Head Circumference --      Peak Flow --      Pain Score 07/25/23 1836 0     Pain Loc --      Pain Education --      Exclude from Growth Chart --     Most recent vital signs: Vitals:   07/25/23 1830  BP: (!) 175/81  Pulse: 65  Resp: 18  Temp: 97.7 F (36.5 C)  SpO2: 100%   General: Awake, alert, oriented. CV:  Good peripheral perfusion. Regular rate and rhythm. Resp:  Normal effort. Lungs clear. Abd:  No distention.  Other:  Trace lower extremity edema.    ED Results / Procedures / Treatments   Labs (all labs ordered are listed, but only abnormal results are displayed) Labs Reviewed  BASIC METABOLIC PANEL WITH GFR - Abnormal;  Notable for the following components:      Result Value   Glucose, Bld 111 (*)    All other components within normal limits  CBC - Abnormal; Notable for the following components:   WBC 3.8 (*)    Hemoglobin 10.9 (*)    HCT 33.8 (*)    MCV 75.1 (*)    MCH 24.2 (*)    All other components within normal limits  URINALYSIS, ROUTINE W REFLEX MICROSCOPIC - Abnormal; Notable for the following components:   Color, Urine YELLOW (*)    APPearance HAZY (*)    Leukocytes,Ua SMALL (*)    Bacteria, UA RARE (*)    All other components within normal limits  CBG MONITORING, ED  TROPONIN I (HIGH SENSITIVITY)  TROPONIN I (HIGH SENSITIVITY)     EKG  I, Phineas Semen, attending physician, personally viewed and interpreted this EKG  EKG Time: 1839 Rate: 60 Rhythm: normal sinus rhythm Axis: normal Intervals: qtc 390 QRS: narrow ST changes: no st elevation Impression: normal ekg   RADIOLOGY None  PROCEDURES:  Critical Care performed: No    MEDICATIONS ORDERED IN ED: Medications - No data to display   IMPRESSION / MDM / ASSESSMENT AND PLAN / ED COURSE  I reviewed the triage vital signs and the nursing notes.                              Differential diagnosis includes, but is not limited to, anemia, electrolyte abnormality, arrhythmia, vasovagal, acs  Patient's presentation is most consistent with acute presentation with potential threat to life or bodily function.   The patient is on the cardiac monitor to evaluate for evidence of arrhythmia and/or significant heart rate changes.  Patient presented to the emergency department today after an near syncopal episode at the time my exam patient is awake alert and oriented.  EKG without concerning arrhythmia.  Blood work without significant anemia or concerning electrolyte abnormality.  Will add on troponin to triage blood work.  Additionally will give patient IV fluids and reassess.  Troponin negative x 2.  Patient did feel  better after IV fluids.  Urine is concerning for possible urinary tract infection.  Will give patient first dose of antibiotics here in the emergency department.  Will plan on discharging with further antibiotics.  At this time given clinical improvement I think is reasonable for patient be discharged home.     FINAL CLINICAL IMPRESSION(S) / ED DIAGNOSES   Final diagnoses:  Near syncope  Lower urinary tract infectious disease     Note:  This document was prepared using Dragon voice recognition software and may include unintentional dictation errors.    Phineas Semen, MD 07/25/23 [phone]

## 2023-08-02 ENCOUNTER — Ambulatory Visit (INDEPENDENT_AMBULATORY_CARE_PROVIDER_SITE_OTHER): Admitting: Internal Medicine

## 2023-08-02 ENCOUNTER — Encounter: Payer: Self-pay | Admitting: Internal Medicine

## 2023-08-02 VITALS — BP 100/70 | HR 69 | Ht 62.0 in | Wt 179.0 lb

## 2023-08-02 DIAGNOSIS — I10 Essential (primary) hypertension: Secondary | ICD-10-CM

## 2023-08-02 DIAGNOSIS — I340 Nonrheumatic mitral (valve) insufficiency: Secondary | ICD-10-CM

## 2023-08-02 DIAGNOSIS — D508 Other iron deficiency anemias: Secondary | ICD-10-CM

## 2023-08-02 DIAGNOSIS — R7303 Prediabetes: Secondary | ICD-10-CM

## 2023-08-02 DIAGNOSIS — E782 Mixed hyperlipidemia: Secondary | ICD-10-CM | POA: Diagnosis not present

## 2023-08-02 NOTE — Progress Notes (Signed)
 Established Patient Office Visit  Subjective:  Patient ID: Tammy Hendricks, female    DOB: [birth year]  Age: 88 y.o. MRN: [id number]  Chief Complaint  Patient presents with   Follow-up    2 week follow up    Patient comes in for follow-up of her ED visit on 07/25/2023.  She is accompanied by her family member.  She went to the emergency room with reports of a near syncopal episode.  In the ED her blood pressure was found to be elevated.  Her workup was essentially unremarkable except for possible UTI.  Patient started to feel better after IV fluids and started on p.o. antibiotics which she has now completed.  Today she is still feeling tired.  Her blood pressure is low at 100/70.  And she also mentions a dry annoying cough.  She could be sensitive to Zestril 5 mg/day.  Advised to stop it but continue her hydrochlorothiazide.  Will check her echocardiogram Advised to get her blood pressure monitored at home.    No other concerns at this time.   Past Medical History:  Diagnosis Date   Arthritis    right knee   Hypertension    Pre-diabetes    Sciatica    left side - occasional   Vertigo    sporadic   Wears dentures    partial upper, full lower    Past Surgical History:  Procedure Laterality Date   ABDOMINAL HYSTERECTOMY     APPENDECTOMY     CATARACT EXTRACTION W/PHACO Right 11/09/2015   Procedure: CATARACT EXTRACTION PHACO AND INTRAOCULAR LENS PLACEMENT (IOC) right eye;  Surgeon: Lockie Mola, MD;  Location: [facility];  Service: Ophthalmology;  Laterality: Right;   CATARACT EXTRACTION W/PHACO Left 12/03/2017   Procedure: CATARACT EXTRACTION PHACO AND INTRAOCULAR LENS PLACEMENT (IOC) LEFT EYE;  Surgeon: Lockie Mola, MD;  Location: [facility];  Service: Ophthalmology;  Laterality: Left;   CHOLECYSTECTOMY     COLONOSCOPY     OOPHORECTOMY     TONSILLECTOMY      Social History   Socioeconomic History   Marital status: Widowed    Spouse  name: Not on file   Number of children: Not on file   Years of education: Not on file   Highest education level: Not on file  Occupational History   Not on file  Tobacco Use   Smoking status: Never   Smokeless tobacco: Never  Vaping Use   Vaping status: Never Used  Substance and Sexual Activity   Alcohol use: No   Drug use: Not on file   Sexual activity: Not on file  Other Topics Concern   Not on file  Social History Narrative   Not on file   Social Drivers of Health   Financial Resource Strain: Low Risk  (04/02/2023)   Overall Financial Resource Strain (CARDIA)    Difficulty of Paying Living Expenses: Not very hard  Food Insecurity: No Food Insecurity (04/02/2023)   Hunger Vital Sign    Worried About Running Out of Food in the Last Year: Never true    Ran Out of Food in the Last Year: Never true  Transportation Needs: No Transportation Needs (04/02/2023)   PRAPARE - Administrator, Civil Service (Medical): No    Lack of Transportation (Non-Medical): No  Physical Activity: Not on file  Stress: Not on file  Social Connections: Not on file  Intimate Partner Violence: Not on file    Family History  Problem  Relation Age of Onset   Dementia Mother    Hypertension Father     No Known Allergies  Outpatient Medications Prior to Visit  Medication Sig   acetaminophen (TYLENOL) 650 MG CR tablet Take 650 mg by mouth every 8 (eight) hours as needed for pain.   aspirin EC 81 MG tablet Take 81 mg by mouth daily. Swallow whole.   brimonidine (ALPHAGAN) 0.2 % ophthalmic solution Place 1 drop into both eyes 2 (two) times daily.   hydrochlorothiazide (HYDRODIURIL) 25 MG tablet TAKE 1 TABLET BY MOUTH ONCE  DAILY   Krill Oil 500 MG CAPS Take 1 capsule by mouth daily.   latanoprost (XALATAN) 0.005 % ophthalmic solution Place 1 drop into both eyes at bedtime.   meloxicam (MOBIC) 15 MG tablet Take 15 mg by mouth daily.   potassium chloride SA (KLOR-CON M) 20 MEQ tablet Take  1 tablet (20 mEq total) by mouth daily.   timolol (TIMOPTIC) 0.5 % ophthalmic solution    [DISCONTINUED] lisinopril (ZESTRIL) 5 MG tablet Take 1 tablet (5 mg total) by mouth daily.   No facility-administered medications prior to visit.    Review of Systems  Constitutional: Negative.  Negative for chills, fever and weight loss.  HENT: Negative.  Negative for sore throat.   Eyes: Negative.   Respiratory: Negative.  Negative for cough and shortness of breath.   Cardiovascular: Negative.  Negative for chest pain, palpitations and leg swelling.  Gastrointestinal: Negative.  Negative for abdominal pain, constipation, diarrhea, heartburn, nausea and vomiting.  Genitourinary: Negative.  Negative for dysuria and flank pain.  Musculoskeletal: Negative.  Negative for joint pain and myalgias.  Skin: Negative.   Neurological: Negative.  Negative for dizziness and headaches.  Endo/Heme/Allergies: Negative.   Psychiatric/Behavioral: Negative.  Negative for depression and suicidal ideas. The patient is not nervous/anxious.        Objective:   BP 100/70   Pulse 69   Ht 5\' 2"  (1.575 m)   Wt 179 lb (81.2 kg)   SpO2 98%   BMI 32.74 kg/m   Vitals:   08/02/23 1309  BP: 100/70  Pulse: 69  Height: 5\' 2"  (1.575 m)  Weight: 179 lb (81.2 kg)  SpO2: 98%  BMI (Calculated): 32.73    Physical Exam Vitals and nursing note reviewed.  Constitutional:      Appearance: Normal appearance.  HENT:     Head: Normocephalic and atraumatic.     Nose: Nose normal.     Mouth/Throat:     Mouth: Mucous membranes are moist.     Pharynx: Oropharynx is clear.  Eyes:     Conjunctiva/sclera: Conjunctivae normal.     Pupils: Pupils are equal, round, and reactive to light.  Cardiovascular:     Rate and Rhythm: Normal rate and regular rhythm.     Pulses: Normal pulses.     Heart sounds: Murmur heard.  Pulmonary:     Effort: Pulmonary effort is normal.     Breath sounds: Normal breath sounds. No wheezing.   Abdominal:     General: Bowel sounds are normal.     Palpations: Abdomen is soft.     Tenderness: There is no abdominal tenderness. There is no right CVA tenderness or left CVA tenderness.  Musculoskeletal:        General: Normal range of motion.     Cervical back: Normal range of motion.     Right lower leg: No edema.     Left lower leg: No edema.  Skin:    General: Skin is warm and dry.  Neurological:     General: No focal deficit present.     Mental Status: She is alert and oriented to person, place, and time.  Psychiatric:        Mood and Affect: Mood normal.        Behavior: Behavior normal.      No results found for any visits on 08/02/23.  Recent Results (from the past 2160 hours)  CMP14+EGFR     Status: Abnormal   Collection Time: 07/05/23  3:38 PM  Result Value Ref Range   Glucose 104 (H) 70 - 99 mg/dL   BUN 11 8 - 27 mg/dL   Creatinine, Ser 1.61 0.57 - 1.00 mg/dL   eGFR 83 >09 UE/AVW/0.98   BUN/Creatinine Ratio 15 12 - 28   Sodium 142 134 - 144 mmol/L   Potassium 3.2 (L) 3.5 - 5.2 mmol/L   Chloride 100 96 - 106 mmol/L   CO2 27 20 - 29 mmol/L   Calcium 9.4 8.7 - 10.3 mg/dL   Total Protein 7.3 6.0 - 8.5 g/dL   Albumin 4.1 3.7 - 4.7 g/dL   Globulin, Total 3.2 1.5 - 4.5 g/dL   Bilirubin Total 0.5 0.0 - 1.2 mg/dL   Alkaline Phosphatase 78 44 - 121 IU/L   AST 20 0 - 40 IU/L   ALT 22 0 - 32 IU/L  Lipid Panel w/o Chol/HDL Ratio     Status: None   Collection Time: 07/05/23  3:38 PM  Result Value Ref Range   Cholesterol, Total 164 100 - 199 mg/dL   Triglycerides 94 0 - 149 mg/dL   HDL 55 >11 mg/dL   VLDL Cholesterol Cal 17 5 - 40 mg/dL   LDL Chol Calc (NIH) 92 0 - 99 mg/dL  CBC with Diff     Status: Abnormal   Collection Time: 07/05/23  3:38 PM  Result Value Ref Range   WBC 4.2 3.4 - 10.8 x10E3/uL   RBC 4.60 3.77 - 5.28 x10E6/uL   Hemoglobin 11.2 11.1 - 15.9 g/dL   Hematocrit 91.4 78.2 - 46.6 %   MCV 77 (L) 79 - 97 fL   MCH 24.3 (L) 26.6 - 33.0 pg   MCHC  31.8 31.5 - 35.7 g/dL   RDW 95.6 21.3 - 08.6 %   Platelets 210 150 - 450 x10E3/uL   Neutrophils 38 Not Estab. %   Lymphs 50 Not Estab. %   Monocytes 11 Not Estab. %   Eos 1 Not Estab. %   Basos 0 Not Estab. %   Neutrophils Absolute 1.6 1.4 - 7.0 x10E3/uL   Lymphocytes Absolute 2.1 0.7 - 3.1 x10E3/uL   Monocytes Absolute 0.5 0.1 - 0.9 x10E3/uL   EOS (ABSOLUTE) 0.1 0.0 - 0.4 x10E3/uL   Basophils Absolute 0.0 0.0 - 0.2 x10E3/uL   Immature Granulocytes 0 Not Estab. %   Immature Grans (Abs) 0.0 0.0 - 0.1 x10E3/uL  Hemoglobin A1c     Status: Abnormal   Collection Time: 07/05/23  3:38 PM  Result Value Ref Range   Hgb A1c MFr Bld 6.4 (H) 4.8 - 5.6 %    Comment:          Prediabetes: 5.7 - 6.4          Diabetes: >6.4          Glycemic control for adults with diabetes: <7.0    Est. average glucose Bld gHb Est-mCnc 137 mg/dL  Basic  metabolic panel     Status: Abnormal   Collection Time: 07/25/23  6:37 PM  Result Value Ref Range   Sodium 140 135 - 145 mmol/L   Potassium 3.7 3.5 - 5.1 mmol/L   Chloride 104 98 - 111 mmol/L   CO2 26 22 - 32 mmol/L   Glucose, Bld 111 (H) 70 - 99 mg/dL    Comment: Glucose reference range applies only to samples taken after fasting for at least 8 hours.   BUN 13 8 - 23 mg/dL   Creatinine, Ser 7.84 0.44 - 1.00 mg/dL   Calcium 9.4 8.9 - 69.6 mg/dL   GFR, Estimated >29 >52 mL/min    Comment: (NOTE) Calculated using the CKD-EPI Creatinine Equation (2021)    Anion gap 10 5 - 15    Comment: Performed at [facility], [address] [place], [place] [address]  CBC     Status: Abnormal   Collection Time: 07/25/23  6:37 PM  Result Value Ref Range   WBC 3.8 (L) 4.0 - 10.5 K/uL   RBC 4.50 3.87 - 5.11 MIL/uL   Hemoglobin 10.9 (L) 12.0 - 15.0 g/dL   HCT 44.0 (L) 10.2 - 72.5 %   MCV 75.1 (L) 80.0 - 100.0 fL   MCH 24.2 (L) 26.0 - 34.0 pg   MCHC 32.2 30.0 - 36.0 g/dL   RDW 36.6 44.0 - 34.7 %   Platelets 220 150 - 400 K/uL   nRBC 0.0 0.0 - 0.2 %     Comment: Performed at [facility], [address] [place], [place] [address]  Urinalysis, Routine w reflex microscopic -Urine, Clean Catch     Status: Abnormal   Collection Time: 07/25/23  6:37 PM  Result Value Ref Range   Color, Urine YELLOW (A) YELLOW   APPearance HAZY (A) CLEAR   Specific Gravity, Urine 1.006 1.005 - 1.030   pH 8.0 5.0 - 8.0   Glucose, UA NEGATIVE NEGATIVE mg/dL   Hgb urine dipstick NEGATIVE NEGATIVE   Bilirubin Urine NEGATIVE NEGATIVE   Ketones, ur NEGATIVE NEGATIVE mg/dL   Protein, ur NEGATIVE NEGATIVE mg/dL   Nitrite NEGATIVE NEGATIVE   Leukocytes,Ua SMALL (A) NEGATIVE   RBC / HPF 0-5 0 - 5 RBC/hpf   WBC, UA 11-20 0 - 5 WBC/hpf   Bacteria, UA RARE (A) NONE SEEN   Squamous Epithelial / HPF 0-5 0 - 5 /HPF   Mucus PRESENT     Comment: Performed at [facility], [address] [place], [place] [address]  Troponin I (High Sensitivity)     Status: None   Collection Time: 07/25/23  6:37 PM  Result Value Ref Range   Troponin I (High Sensitivity) 3 <18 ng/L    Comment: (NOTE) Elevated high sensitivity troponin I (hsTnI) values and significant  changes across serial measurements may suggest ACS but many other  chronic and acute conditions are known to elevate hsTnI results.  Refer to the "Links" section for chest pain algorithms and additional  guidance. Performed at [facility], [address] [place], [place] [address]   Troponin I (High Sensitivity)     Status: None   Collection Time: 07/25/23  9:56 PM  Result Value Ref Range   Troponin I (High Sensitivity) 3 <18 ng/L    Comment: (NOTE) Elevated high sensitivity troponin I (hsTnI) values and significant  changes across serial measurements may suggest ACS but many other  chronic and acute conditions are known to elevate hsTnI results.  Refer to the "Links" section for chest pain algorithms and additional  guidance. Performed at [facility], [address],  [place], [place] [address]       Assessment & Plan:  Stop Zestril.  Continue other medications.  Rest and fluids.  Schedule echocardiogram. Problem List Items Addressed This Visit     Mixed hyperlipidemia   Essential hypertension, benign - Primary   Prediabetes   Absolute anemia   Other Visit Diagnoses       Nonrheumatic mitral valve regurgitation       Relevant Orders   PCV ECHOCARDIOGRAM COMPLETE       Return in about 10 days (around 08/12/2023).   Total time spent: 30 minutes  Margaretann Loveless, MD  08/02/2023   This document may have been prepared by [facility] Voice Recognition software and as such may include unintentional dictation errors.

## 2023-08-08 ENCOUNTER — Ambulatory Visit

## 2023-08-08 DIAGNOSIS — I340 Nonrheumatic mitral (valve) insufficiency: Secondary | ICD-10-CM | POA: Diagnosis not present

## 2023-08-16 ENCOUNTER — Ambulatory Visit (INDEPENDENT_AMBULATORY_CARE_PROVIDER_SITE_OTHER): Admitting: Internal Medicine

## 2023-08-16 ENCOUNTER — Ambulatory Visit
Admission: RE | Admit: 2023-08-16 | Discharge: 2023-08-16 | Disposition: A | Discharge status: Home / Self Care | Source: Ambulatory Visit | Attending: Internal Medicine | Admitting: Internal Medicine

## 2023-08-16 ENCOUNTER — Ambulatory Visit
Admission: RE | Admit: 2023-08-16 | Discharge: 2023-08-16 | Disposition: A | Discharge status: Home / Self Care | Attending: Internal Medicine | Admitting: Internal Medicine

## 2023-08-16 ENCOUNTER — Encounter: Payer: Self-pay | Admitting: Internal Medicine

## 2023-08-16 VITALS — BP 148/80 | HR 71 | Ht 62.0 in | Wt 176.0 lb

## 2023-08-16 DIAGNOSIS — R197 Diarrhea, unspecified: Secondary | ICD-10-CM

## 2023-08-16 DIAGNOSIS — R052 Subacute cough: Secondary | ICD-10-CM

## 2023-08-16 DIAGNOSIS — D508 Other iron deficiency anemias: Secondary | ICD-10-CM | POA: Diagnosis not present

## 2023-08-16 DIAGNOSIS — I10 Essential (primary) hypertension: Secondary | ICD-10-CM

## 2023-08-16 DIAGNOSIS — E782 Mixed hyperlipidemia: Secondary | ICD-10-CM | POA: Diagnosis not present

## 2023-08-16 DIAGNOSIS — R059 Cough, unspecified: Secondary | ICD-10-CM | POA: Diagnosis not present

## 2023-08-16 DIAGNOSIS — R5383 Other fatigue: Secondary | ICD-10-CM | POA: Diagnosis not present

## 2023-08-16 NOTE — Progress Notes (Signed)
 Established Patient Office Visit  Subjective:  Patient ID: Tammy Hendricks, female    DOB: 16-Aug-1935  Age: 88 y.o. MRN: [id number]  Chief Complaint  Patient presents with   Follow-up    10 day follow up    Patient is here for her follow-up accompanied by her family member.  She states she is not feeling well and is still feeling very tired.  Her appetite is low and she has lost a few pounds since her last visit.  However her blood pressure is high since she was taken off lisinopril .  Currently only on a small dose of hydrochlorothiazide .  She does not complain of nausea or vomiting, no abdominal pain, occasionally gets loose stools, however today she reports that she has seen worms in her stool! Will check stool sample for ova and parasites.  Repeat her CBC. Meanwhile continue to monitor blood pressure at home, encourage p.o. fluids.  Avoid salt. Patient drinks 1-2 Ensure cans per day. Continues to have a dry cough, even though lisinopril  was stopped at her last visit.  Could be allergies.  Advised to start Claritin.  Will check chest x-ray today.    No other concerns at this time.   Past Medical History:  Diagnosis Date   Arthritis    right knee   Hypertension    Pre-diabetes    Sciatica    left side - occasional   Vertigo    sporadic   Wears dentures    partial upper, full lower    Past Surgical History:  Procedure Laterality Date   ABDOMINAL HYSTERECTOMY     APPENDECTOMY     CATARACT EXTRACTION W/PHACO Right 11/09/2015   Procedure: CATARACT EXTRACTION PHACO AND INTRAOCULAR LENS PLACEMENT (IOC) right eye;  Surgeon: Annell Kidney, MD;  Location: [facility];  Service: Ophthalmology;  Laterality: Right;   CATARACT EXTRACTION W/PHACO Left 12/03/2017   Procedure: CATARACT EXTRACTION PHACO AND INTRAOCULAR LENS PLACEMENT (IOC) LEFT EYE;  Surgeon: Annell Kidney, MD;  Location: [facility];  Service: Ophthalmology;  Laterality: Left;    CHOLECYSTECTOMY     COLONOSCOPY     OOPHORECTOMY     TONSILLECTOMY      Social History   Socioeconomic History   Marital status: Widowed    Spouse name: Not on file   Number of children: Not on file   Years of education: Not on file   Highest education level: Not on file  Occupational History   Not on file  Tobacco Use   Smoking status: Never   Smokeless tobacco: Never  Vaping Use   Vaping status: Never Used  Substance and Sexual Activity   Alcohol use: No   Drug use: Not on file   Sexual activity: Not on file  Other Topics Concern   Not on file  Social History Narrative   Not on file   Social Drivers of Health   Financial Resource Strain: Low Risk  (04/02/2023)   Overall Financial Resource Strain (CARDIA)    Difficulty of Paying Living Expenses: Not very hard  Food Insecurity: No Food Insecurity (04/02/2023)   Hunger Vital Sign    Worried About Running Out of Food in the Last Year: Never true    Ran Out of Food in the Last Year: Never true  Transportation Needs: No Transportation Needs (04/02/2023)   PRAPARE - Administrator, Civil Service (Medical): No    Lack of Transportation (Non-Medical): No  Physical Activity: Not on file  Stress: Not on file  Social Connections: Not on file  Intimate Partner Violence: Not on file    Family History  Problem Relation Age of Onset   Dementia Mother    Hypertension Father     No Known Allergies  Outpatient Medications Prior to Visit  Medication Sig   acetaminophen (TYLENOL) 650 MG CR tablet Take 650 mg by mouth every 8 (eight) hours as needed for pain.   aspirin EC 81 MG tablet Take 81 mg by mouth daily. Swallow whole.   brimonidine  (ALPHAGAN ) 0.2 % ophthalmic solution Place 1 drop into both eyes 2 (two) times daily.   hydrochlorothiazide  (HYDRODIURIL ) 25 MG tablet TAKE 1 TABLET BY MOUTH ONCE  DAILY   Krill Oil 500 MG CAPS Take 1 capsule by mouth daily.   latanoprost (XALATAN) 0.005 % ophthalmic solution  Place 1 drop into both eyes at bedtime.   meloxicam (MOBIC) 15 MG tablet Take 15 mg by mouth daily.   potassium chloride  SA (KLOR-CON  M) 20 MEQ tablet Take 1 tablet (20 mEq total) by mouth daily.   timolol  (TIMOPTIC ) 0.5 % ophthalmic solution    No facility-administered medications prior to visit.    Review of Systems  Constitutional:  Positive for malaise/fatigue and weight loss. Negative for chills, diaphoresis and fever.  HENT:  Positive for sore throat. Negative for ear discharge.   Eyes: Negative.   Respiratory: Negative.  Negative for cough and shortness of breath.   Cardiovascular: Negative.  Negative for chest pain, palpitations and leg swelling.  Gastrointestinal: Negative.  Negative for abdominal pain, blood in stool, constipation, diarrhea, heartburn, melena, nausea and vomiting.  Genitourinary: Negative.  Negative for dysuria and flank pain.  Musculoskeletal: Negative.  Negative for joint pain and myalgias.  Skin: Negative.   Neurological: Negative.  Negative for dizziness and headaches.  Endo/Heme/Allergies: Negative.   Psychiatric/Behavioral: Negative.  Negative for depression and suicidal ideas. The patient is not nervous/anxious.        Objective:   BP (!) 148/80   Pulse 71   Ht 5\' 2"  (1.575 m)   Wt 176 lb (79.8 kg)   SpO2 99%   BMI 32.19 kg/m   Vitals:   08/16/23 1251  BP: (!) 148/80  Pulse: 71  Height: 5\' 2"  (1.575 m)  Weight: 176 lb (79.8 kg)  SpO2: 99%  BMI (Calculated): 32.18    Physical Exam Vitals and nursing note reviewed.  Constitutional:      Appearance: Normal appearance.  HENT:     Head: Normocephalic and atraumatic.     Nose: Nose normal.     Mouth/Throat:     Mouth: Mucous membranes are moist.     Pharynx: Oropharynx is clear.  Eyes:     Conjunctiva/sclera: Conjunctivae normal.     Pupils: Pupils are equal, round, and reactive to light.  Cardiovascular:     Rate and Rhythm: Normal rate and regular rhythm.     Pulses: Normal  pulses.     Heart sounds: Normal heart sounds. No murmur heard. Pulmonary:     Effort: Pulmonary effort is normal.     Breath sounds: Normal breath sounds. No wheezing.  Abdominal:     General: Bowel sounds are normal.     Palpations: Abdomen is soft.     Tenderness: There is no abdominal tenderness. There is no right CVA tenderness or left CVA tenderness.  Musculoskeletal:        General: Normal range of motion.     Cervical back:  Normal range of motion.     Right lower leg: No edema.     Left lower leg: No edema.  Skin:    General: Skin is warm and dry.  Neurological:     General: No focal deficit present.     Mental Status: She is alert and oriented to person, place, and time.  Psychiatric:        Mood and Affect: Mood normal.        Behavior: Behavior normal.      No results found for any visits on 08/16/23.  Recent Results (from the past 2160 hours)  CMP14+EGFR     Status: Abnormal   Collection Time: 07/05/23  3:38 PM  Result Value Ref Range   Glucose 104 (H) 70 - 99 mg/dL   BUN 11 8 - 27 mg/dL   Creatinine, Ser 1.61 0.57 - 1.00 mg/dL   eGFR 83 >09 UE/AVW/0.98   BUN/Creatinine Ratio 15 12 - 28   Sodium 142 134 - 144 mmol/L   Potassium 3.2 (L) 3.5 - 5.2 mmol/L   Chloride 100 96 - 106 mmol/L   CO2 27 20 - 29 mmol/L   Calcium 9.4 8.7 - 10.3 mg/dL   Total Protein 7.3 6.0 - 8.5 g/dL   Albumin 4.1 3.7 - 4.7 g/dL   Globulin, Total 3.2 1.5 - 4.5 g/dL   Bilirubin Total 0.5 0.0 - 1.2 mg/dL   Alkaline Phosphatase 78 44 - 121 IU/L   AST 20 0 - 40 IU/L   ALT 22 0 - 32 IU/L  Lipid Panel w/o Chol/HDL Ratio     Status: None   Collection Time: 07/05/23  3:38 PM  Result Value Ref Range   Cholesterol, Total 164 100 - 199 mg/dL   Triglycerides 94 0 - 149 mg/dL   HDL 55 >11 mg/dL   VLDL Cholesterol Cal 17 5 - 40 mg/dL   LDL Chol Calc (NIH) 92 0 - 99 mg/dL  CBC with Diff     Status: Abnormal   Collection Time: 07/05/23  3:38 PM  Result Value Ref Range   WBC 4.2 3.4 - 10.8  x10E3/uL   RBC 4.60 3.77 - 5.28 x10E6/uL   Hemoglobin 11.2 11.1 - 15.9 g/dL   Hematocrit 91.4 78.2 - 46.6 %   MCV 77 (L) 79 - 97 fL   MCH 24.3 (L) 26.6 - 33.0 pg   MCHC 31.8 31.5 - 35.7 g/dL   RDW 95.6 21.3 - 08.6 %   Platelets 210 150 - 450 x10E3/uL   Neutrophils 38 Not Estab. %   Lymphs 50 Not Estab. %   Monocytes 11 Not Estab. %   Eos 1 Not Estab. %   Basos 0 Not Estab. %   Neutrophils Absolute 1.6 1.4 - 7.0 x10E3/uL   Lymphocytes Absolute 2.1 0.7 - 3.1 x10E3/uL   Monocytes Absolute 0.5 0.1 - 0.9 x10E3/uL   EOS (ABSOLUTE) 0.1 0.0 - 0.4 x10E3/uL   Basophils Absolute 0.0 0.0 - 0.2 x10E3/uL   Immature Granulocytes 0 Not Estab. %   Immature Grans (Abs) 0.0 0.0 - 0.1 x10E3/uL  Hemoglobin A1c     Status: Abnormal   Collection Time: 07/05/23  3:38 PM  Result Value Ref Range   Hgb A1c MFr Bld 6.4 (H) 4.8 - 5.6 %    Comment:          Prediabetes: 5.7 - 6.4          Diabetes: >6.4  Glycemic control for adults with diabetes: <7.0    Est. average glucose Bld gHb Est-mCnc 137 mg/dL  Basic metabolic panel     Status: Abnormal   Collection Time: 07/25/23  6:37 PM  Result Value Ref Range   Sodium 140 135 - 145 mmol/L   Potassium 3.7 3.5 - 5.1 mmol/L   Chloride 104 98 - 111 mmol/L   CO2 26 22 - 32 mmol/L   Glucose, Bld 111 (H) 70 - 99 mg/dL    Comment: Glucose reference range applies only to samples taken after fasting for at least 8 hours.   BUN 13 8 - 23 mg/dL   Creatinine, Ser 1.61 0.44 - 1.00 mg/dL   Calcium 9.4 8.9 - 09.6 mg/dL   GFR, Estimated >04 >54 mL/min    Comment: (NOTE) Calculated using the CKD-EPI Creatinine Equation (2021)    Anion gap 10 5 - 15    Comment: Performed at [facility], [address] [place], [place] [address]  CBC     Status: Abnormal   Collection Time: 07/25/23  6:37 PM  Result Value Ref Range   WBC 3.8 (L) 4.0 - 10.5 K/uL   RBC 4.50 3.87 - 5.11 MIL/uL   Hemoglobin 10.9 (L) 12.0 - 15.0 g/dL   HCT 91.4 (L) 78.2 - 95.6 %   MCV  75.1 (L) 80.0 - 100.0 fL   MCH 24.2 (L) 26.0 - 34.0 pg   MCHC 32.2 30.0 - 36.0 g/dL   RDW 21.3 08.6 - 57.8 %   Platelets 220 150 - 400 K/uL   nRBC 0.0 0.0 - 0.2 %    Comment: Performed at [facility], [address] [place], [place] [address]  Urinalysis, Routine w reflex microscopic -Urine, Clean Catch     Status: Abnormal   Collection Time: 07/25/23  6:37 PM  Result Value Ref Range   Color, Urine YELLOW (A) YELLOW   APPearance HAZY (A) CLEAR   Specific Gravity, Urine 1.006 1.005 - 1.030   pH 8.0 5.0 - 8.0   Glucose, UA NEGATIVE NEGATIVE mg/dL   Hgb urine dipstick NEGATIVE NEGATIVE   Bilirubin Urine NEGATIVE NEGATIVE   Ketones, ur NEGATIVE NEGATIVE mg/dL   Protein, ur NEGATIVE NEGATIVE mg/dL   Nitrite NEGATIVE NEGATIVE   Leukocytes,Ua SMALL (A) NEGATIVE   RBC / HPF 0-5 0 - 5 RBC/hpf   WBC, UA 11-20 0 - 5 WBC/hpf   Bacteria, UA RARE (A) NONE SEEN   Squamous Epithelial / HPF 0-5 0 - 5 /HPF   Mucus PRESENT     Comment: Performed at [facility], [address] [place], [place] [address]  Troponin I (High Sensitivity)     Status: None   Collection Time: 07/25/23  6:37 PM  Result Value Ref Range   Troponin I (High Sensitivity) 3 <18 ng/L    Comment: (NOTE) Elevated high sensitivity troponin I (hsTnI) values and significant  changes across serial measurements may suggest ACS but many other  chronic and acute conditions are known to elevate hsTnI results.  Refer to the "Links" section for chest pain algorithms and additional  guidance. Performed at [facility], [address] [place], [place] [address]   Troponin I (High Sensitivity)     Status: None   Collection Time: 07/25/23  9:56 PM  Result Value Ref Range   Troponin I (High Sensitivity) 3 <18 ng/L    Comment: (NOTE) Elevated high sensitivity troponin I (hsTnI) values and significant  changes  across serial measurements may suggest ACS but many other  chronic and acute conditions are  known to elevate hsTnI results.  Refer to the "Links" section for chest pain algorithms and additional  guidance. Performed at [facility], [address] [place], [place] [address]       Assessment & Plan:  Check stool for culture, ova and parasites.  May need a GI referral. Check chest x-ray today.  Monitor blood pressure at home.  Continue rest of medications. Problem List Items Addressed This Visit     Mixed hyperlipidemia   Essential hypertension, benign - Primary   Absolute anemia   Other Visit Diagnoses       Other fatigue       Relevant Orders   CBC with Diff   TSH+T4F+T3Free     Diarrhea, unspecified type       Relevant Orders   Culture, Stool   Ova and parasite examination     Subacute cough       Relevant Orders   DG Chest 2 View       Return in about 2 weeks (around 08/30/2023).   Total time spent: 30 minutes  Aisha Hove, MD  08/16/2023   This document may have been prepared by [facility] Voice Recognition software and as such may include unintentional dictation errors.

## 2023-08-17 LAB — CBC WITH DIFFERENTIAL/PLATELET
Basophils Absolute: 0 10*3/uL (ref 0.0–0.2)
Basos: 1 %
EOS (ABSOLUTE): 0.1 10*3/uL (ref 0.0–0.4)
Eos: 2 %
Hematocrit: 34.1 % (ref 34.0–46.6)
Hemoglobin: 11.3 g/dL (ref 11.1–15.9)
Immature Grans (Abs): 0 10*3/uL (ref 0.0–0.1)
Immature Granulocytes: 0 %
Lymphocytes Absolute: 1.8 10*3/uL (ref 0.7–3.1)
Lymphs: 43 %
MCH: 24.7 pg — ABNORMAL LOW (ref 26.6–33.0)
MCHC: 33.1 g/dL (ref 31.5–35.7)
MCV: 75 fL — ABNORMAL LOW (ref 79–97)
Monocytes Absolute: 0.6 10*3/uL (ref 0.1–0.9)
Monocytes: 14 %
Neutrophils Absolute: 1.7 10*3/uL (ref 1.4–7.0)
Neutrophils: 40 %
Platelets: 191 10*3/uL (ref 150–450)
RBC: 4.58 x10E6/uL (ref 3.77–5.28)
RDW: 13 % (ref 11.7–15.4)
WBC: 4.2 10*3/uL (ref 3.4–10.8)

## 2023-08-17 LAB — TSH+T4F+T3FREE
Free T4: 1.36 ng/dL (ref 0.82–1.77)
T3, Free: 3.8 pg/mL (ref 2.0–4.4)
TSH: 1.06 u[IU]/mL (ref 0.450–4.500)

## 2023-08-20 DIAGNOSIS — R197 Diarrhea, unspecified: Secondary | ICD-10-CM | POA: Diagnosis not present

## 2023-08-22 LAB — OVA AND PARASITE EXAMINATION

## 2023-08-24 LAB — STOOL CULTURE: E coli, Shiga toxin Assay: NEGATIVE

## 2023-08-27 ENCOUNTER — Other Ambulatory Visit: Payer: Self-pay | Admitting: Internal Medicine

## 2023-08-27 DIAGNOSIS — R052 Subacute cough: Secondary | ICD-10-CM

## 2023-08-27 DIAGNOSIS — K219 Gastro-esophageal reflux disease without esophagitis: Secondary | ICD-10-CM

## 2023-08-27 DIAGNOSIS — R053 Chronic cough: Secondary | ICD-10-CM

## 2023-08-27 MED ORDER — BENZONATATE 100 MG PO CAPS
100.0000 mg | ORAL_CAPSULE | Freq: Three times a day (TID) | ORAL | 1 refills | Status: DC | PRN
Start: 2023-08-27 — End: 2023-11-29

## 2023-08-27 MED ORDER — PANTOPRAZOLE SODIUM 40 MG PO TBEC: 40.0000 mg | DELAYED_RELEASE_TABLET | Freq: Every day | ORAL | 1 refills | Status: DC

## 2023-08-27 NOTE — Progress Notes (Signed)
 Patient notified

## 2023-08-31 ENCOUNTER — Other Ambulatory Visit: Payer: Self-pay | Admitting: Internal Medicine

## 2023-09-06 ENCOUNTER — Encounter: Payer: Self-pay | Admitting: Internal Medicine

## 2023-09-06 ENCOUNTER — Ambulatory Visit: Admitting: Internal Medicine

## 2023-09-06 VITALS — BP 140/90 | HR 91 | Ht 62.0 in | Wt 174.8 lb

## 2023-09-06 DIAGNOSIS — I10 Essential (primary) hypertension: Secondary | ICD-10-CM | POA: Diagnosis not present

## 2023-09-06 DIAGNOSIS — E782 Mixed hyperlipidemia: Secondary | ICD-10-CM

## 2023-09-06 DIAGNOSIS — K219 Gastro-esophageal reflux disease without esophagitis: Secondary | ICD-10-CM | POA: Diagnosis not present

## 2023-09-06 DIAGNOSIS — D500 Iron deficiency anemia secondary to blood loss (chronic): Secondary | ICD-10-CM | POA: Diagnosis not present

## 2023-09-06 DIAGNOSIS — R7303 Prediabetes: Secondary | ICD-10-CM

## 2023-09-06 MED ORDER — PANTOPRAZOLE SODIUM 40 MG PO TBEC: 40.0000 mg | DELAYED_RELEASE_TABLET | Freq: Every day | ORAL | 1 refills | Status: DC

## 2023-09-06 MED ORDER — MELOXICAM 15 MG PO TABS: 15.0000 mg | ORAL_TABLET | Freq: Every day | ORAL | 3 refills | Status: DC

## 2023-09-06 NOTE — Progress Notes (Signed)
 Established Patient Office Visit  Subjective:  Patient ID: Tammy Hendricks, female    DOB: 1935/11/19  Age: 88 y.o. MRN: [id number]  Chief Complaint  Patient presents with   Follow-up    2 week follow up    Patient comes in for follow-up accompanied by her family member.  She is generally feeling well and does not have any other significant complaints except some discomfort behind her right knee.  She does have a prescription for meloxicam and advised to rub Voltaren gel as needed.  Her blood pressure is still slightly elevated but she can only tolerate hydrochlorothiazide .  She is no longer taking lisinopril .  Complains of mild intermittent cough but her chest x-ray was negative.  Suspect allergies but she is taking Claritin now.  Also possible GERD, did not start Protonix  yet will start from today.  Patient had reported seeing worms in her stool and stool sample was sent to the lab but did not show any ova and parasites in that specimen.  Will repeat 1 more time.  Denies abdominal cramps or discomfort, no black-colored stools.    No other concerns at this time.   Past Medical History:  Diagnosis Date   Arthritis    right knee   Hypertension    Pre-diabetes    Sciatica    left side - occasional   Vertigo    sporadic   Wears dentures    partial upper, full lower    Past Surgical History:  Procedure Laterality Date   ABDOMINAL HYSTERECTOMY     APPENDECTOMY     CATARACT EXTRACTION W/PHACO Right 11/09/2015   Procedure: CATARACT EXTRACTION PHACO AND INTRAOCULAR LENS PLACEMENT (IOC) right eye;  Surgeon: Annell Kidney, MD;  Location: [facility];  Service: Ophthalmology;  Laterality: Right;   CATARACT EXTRACTION W/PHACO Left 12/03/2017   Procedure: CATARACT EXTRACTION PHACO AND INTRAOCULAR LENS PLACEMENT (IOC) LEFT EYE;  Surgeon: Annell Kidney, MD;  Location: [facility];  Service: Ophthalmology;  Laterality: Left;   CHOLECYSTECTOMY     COLONOSCOPY      OOPHORECTOMY     TONSILLECTOMY      Social History   Socioeconomic History   Marital status: Widowed    Spouse name: Not on file   Number of children: Not on file   Years of education: Not on file   Highest education level: Not on file  Occupational History   Not on file  Tobacco Use   Smoking status: Never   Smokeless tobacco: Never  Vaping Use   Vaping status: Never Used  Substance and Sexual Activity   Alcohol use: No   Drug use: Not on file   Sexual activity: Not on file  Other Topics Concern   Not on file  Social History Narrative   Not on file   Social Drivers of Health   Financial Resource Strain: Low Risk  (04/02/2023)   Overall Financial Resource Strain (CARDIA)    Difficulty of Paying Living Expenses: Not very hard  Food Insecurity: No Food Insecurity (04/02/2023)   Hunger Vital Sign    Worried About Running Out of Food in the Last Year: Never true    Ran Out of Food in the Last Year: Never true  Transportation Needs: No Transportation Needs (04/02/2023)   PRAPARE - Administrator, Civil Service (Medical): No    Lack of Transportation (Non-Medical): No  Physical Activity: Not on file  Stress: Not on file  Social Connections: Not on  file  Intimate Partner Violence: Not on file    Family History  Problem Relation Age of Onset   Dementia Mother    Hypertension Father     No Known Allergies  Outpatient Medications Prior to Visit  Medication Sig   acetaminophen (TYLENOL) 650 MG CR tablet Take 650 mg by mouth every 8 (eight) hours as needed for pain.   aspirin EC 81 MG tablet Take 81 mg by mouth daily. Swallow whole.   brimonidine  (ALPHAGAN ) 0.2 % ophthalmic solution Place 1 drop into both eyes 2 (two) times daily.   hydrochlorothiazide  (HYDRODIURIL ) 25 MG tablet TAKE 1 TABLET BY MOUTH ONCE  DAILY   Krill Oil 500 MG CAPS Take 1 capsule by mouth daily.   latanoprost (XALATAN) 0.005 % ophthalmic solution Place 1 drop into both eyes at  bedtime.   loratadine (CLARITIN) 10 MG tablet Take 10 mg by mouth daily.   potassium chloride  SA (KLOR-CON  M) 20 MEQ tablet Take 1 tablet (20 mEq total) by mouth daily.   senna-docusate (SENOKOT-S) 8.6-50 MG tablet Take 1 tablet by mouth daily.   timolol  (TIMOPTIC ) 0.5 % ophthalmic solution    [DISCONTINUED] lisinopril  (ZESTRIL ) 5 MG tablet Take 5 mg by mouth daily.   benzonatate  (TESSALON  PERLES) 100 MG capsule Take 1 capsule (100 mg total) by mouth 3 (three) times daily as needed for cough. (Patient not taking: Reported on 09/06/2023)   [DISCONTINUED] meloxicam (MOBIC) 15 MG tablet Take 15 mg by mouth daily. (Patient not taking: Reported on 09/06/2023)   [DISCONTINUED] pantoprazole  (PROTONIX ) 40 MG tablet Take 1 tablet (40 mg total) by mouth daily. (Patient not taking: Reported on 09/06/2023)   No facility-administered medications prior to visit.    Review of Systems  Constitutional: Negative.  Negative for chills, diaphoresis, fever, malaise/fatigue and weight loss.  HENT: Negative.  Negative for sore throat.   Eyes: Negative.   Respiratory: Negative.  Negative for cough and shortness of breath.   Cardiovascular: Negative.  Negative for chest pain, palpitations and leg swelling.  Gastrointestinal: Negative.  Negative for abdominal pain, constipation, diarrhea, heartburn, nausea and vomiting.  Genitourinary: Negative.  Negative for dysuria and flank pain.  Musculoskeletal:  Positive for joint pain. Negative for myalgias.  Skin: Negative.   Neurological: Negative.  Negative for dizziness and headaches.  Endo/Heme/Allergies: Negative.   Psychiatric/Behavioral: Negative.  Negative for depression and suicidal ideas. The patient is not nervous/anxious.        Objective:   BP (!) 140/90   Pulse 91   Ht 5\' 2"  (1.575 m)   Wt 174 lb 12.8 oz (79.3 kg)   SpO2 96%   BMI 31.97 kg/m   Vitals:   09/06/23 1346  BP: (!) 140/90  Pulse: 91  Height: 5\' 2"  (1.575 m)  Weight: 174 lb 12.8 oz  (79.3 kg)  SpO2: 96%  BMI (Calculated): 31.96    Physical Exam Vitals and nursing note reviewed.  Constitutional:      Appearance: Normal appearance.  HENT:     Head: Normocephalic and atraumatic.     Nose: Nose normal.     Mouth/Throat:     Mouth: Mucous membranes are moist.     Pharynx: Oropharynx is clear.  Eyes:     Conjunctiva/sclera: Conjunctivae normal.     Pupils: Pupils are equal, round, and reactive to light.  Cardiovascular:     Rate and Rhythm: Normal rate and regular rhythm.     Pulses: Normal pulses.     Heart sounds:  Normal heart sounds. No murmur heard. Pulmonary:     Effort: Pulmonary effort is normal.     Breath sounds: Normal breath sounds. No wheezing.  Abdominal:     General: Bowel sounds are normal.     Palpations: Abdomen is soft.     Tenderness: There is no abdominal tenderness. There is no right CVA tenderness or left CVA tenderness.  Musculoskeletal:        General: Normal range of motion.     Cervical back: Normal range of motion.     Right lower leg: No edema.     Left lower leg: No edema.  Skin:    General: Skin is warm and dry.  Neurological:     General: No focal deficit present.     Mental Status: She is alert and oriented to person, place, and time.  Psychiatric:        Mood and Affect: Mood normal.        Behavior: Behavior normal.      No results found for any visits on 09/06/23.  Recent Results (from the past 2160 hours)  CMP14+EGFR     Status: Abnormal   Collection Time: 07/05/23  3:38 PM  Result Value Ref Range   Glucose 104 (H) 70 - 99 mg/dL   BUN 11 8 - 27 mg/dL   Creatinine, Ser 0.86 0.57 - 1.00 mg/dL   eGFR 83 >57 QI/ONG/2.95   BUN/Creatinine Ratio 15 12 - 28   Sodium 142 134 - 144 mmol/L   Potassium 3.2 (L) 3.5 - 5.2 mmol/L   Chloride 100 96 - 106 mmol/L   CO2 27 20 - 29 mmol/L   Calcium 9.4 8.7 - 10.3 mg/dL   Total Protein 7.3 6.0 - 8.5 g/dL   Albumin 4.1 3.7 - 4.7 g/dL   Globulin, Total 3.2 1.5 - 4.5 g/dL    Bilirubin Total 0.5 0.0 - 1.2 mg/dL   Alkaline Phosphatase 78 44 - 121 IU/L   AST 20 0 - 40 IU/L   ALT 22 0 - 32 IU/L  Lipid Panel w/o Chol/HDL Ratio     Status: None   Collection Time: 07/05/23  3:38 PM  Result Value Ref Range   Cholesterol, Total 164 100 - 199 mg/dL   Triglycerides 94 0 - 149 mg/dL   HDL 55 >28 mg/dL   VLDL Cholesterol Cal 17 5 - 40 mg/dL   LDL Chol Calc (NIH) 92 0 - 99 mg/dL  CBC with Diff     Status: Abnormal   Collection Time: 07/05/23  3:38 PM  Result Value Ref Range   WBC 4.2 3.4 - 10.8 x10E3/uL   RBC 4.60 3.77 - 5.28 x10E6/uL   Hemoglobin 11.2 11.1 - 15.9 g/dL   Hematocrit 41.3 24.4 - 46.6 %   MCV 77 (L) 79 - 97 fL   MCH 24.3 (L) 26.6 - 33.0 pg   MCHC 31.8 31.5 - 35.7 g/dL   RDW 01.0 27.2 - 53.6 %   Platelets 210 150 - 450 x10E3/uL   Neutrophils 38 Not Estab. %   Lymphs 50 Not Estab. %   Monocytes 11 Not Estab. %   Eos 1 Not Estab. %   Basos 0 Not Estab. %   Neutrophils Absolute 1.6 1.4 - 7.0 x10E3/uL   Lymphocytes Absolute 2.1 0.7 - 3.1 x10E3/uL   Monocytes Absolute 0.5 0.1 - 0.9 x10E3/uL   EOS (ABSOLUTE) 0.1 0.0 - 0.4 x10E3/uL   Basophils Absolute 0.0 0.0 - 0.2 x10E3/uL  Immature Granulocytes 0 Not Estab. %   Immature Grans (Abs) 0.0 0.0 - 0.1 x10E3/uL  Hemoglobin A1c     Status: Abnormal   Collection Time: 07/05/23  3:38 PM  Result Value Ref Range   Hgb A1c MFr Bld 6.4 (H) 4.8 - 5.6 %    Comment:          Prediabetes: 5.7 - 6.4          Diabetes: >6.4          Glycemic control for adults with diabetes: <7.0    Est. average glucose Bld gHb Est-mCnc 137 mg/dL  Basic metabolic panel     Status: Abnormal   Collection Time: 07/25/23  6:37 PM  Result Value Ref Range   Sodium 140 135 - 145 mmol/L   Potassium 3.7 3.5 - 5.1 mmol/L   Chloride 104 98 - 111 mmol/L   CO2 26 22 - 32 mmol/L   Glucose, Bld 111 (H) 70 - 99 mg/dL    Comment: Glucose reference range applies only to samples taken after fasting for at least 8 hours.   BUN 13 8 - 23 mg/dL    Creatinine, Ser 2.95 0.44 - 1.00 mg/dL   Calcium 9.4 8.9 - 28.4 mg/dL   GFR, Estimated >13 >24 mL/min    Comment: (NOTE) Calculated using the CKD-EPI Creatinine Equation (2021)    Anion gap 10 5 - 15    Comment: Performed at [facility], [address] [place], [place] [address]  CBC     Status: Abnormal   Collection Time: 07/25/23  6:37 PM  Result Value Ref Range   WBC 3.8 (L) 4.0 - 10.5 K/uL   RBC 4.50 3.87 - 5.11 MIL/uL   Hemoglobin 10.9 (L) 12.0 - 15.0 g/dL   HCT 72.5 (L) 36.6 - 44.0 %   MCV 75.1 (L) 80.0 - 100.0 fL   MCH 24.2 (L) 26.0 - 34.0 pg   MCHC 32.2 30.0 - 36.0 g/dL   RDW 34.7 42.5 - 95.6 %   Platelets 220 150 - 400 K/uL   nRBC 0.0 0.0 - 0.2 %    Comment: Performed at [facility], [address] [place], [place] [address]  Urinalysis, Routine w reflex microscopic -Urine, Clean Catch     Status: Abnormal   Collection Time: 07/25/23  6:37 PM  Result Value Ref Range   Color, Urine YELLOW (A) YELLOW   APPearance HAZY (A) CLEAR   Specific Gravity, Urine 1.006 1.005 - 1.030   pH 8.0 5.0 - 8.0   Glucose, UA NEGATIVE NEGATIVE mg/dL   Hgb urine dipstick NEGATIVE NEGATIVE   Bilirubin Urine NEGATIVE NEGATIVE   Ketones, ur NEGATIVE NEGATIVE mg/dL   Protein, ur NEGATIVE NEGATIVE mg/dL   Nitrite NEGATIVE NEGATIVE   Leukocytes,Ua SMALL (A) NEGATIVE   RBC / HPF 0-5 0 - 5 RBC/hpf   WBC, UA 11-20 0 - 5 WBC/hpf   Bacteria, UA RARE (A) NONE SEEN   Squamous Epithelial / HPF 0-5 0 - 5 /HPF   Mucus PRESENT     Comment: Performed at [facility], [address] [place], [place] [address]  Troponin I (High Sensitivity)     Status: None   Collection Time: 07/25/23  6:37 PM  Result Value Ref Range   Troponin I (High Sensitivity) 3 <18 ng/L    Comment: (NOTE) Elevated high sensitivity troponin I (hsTnI) values and significant  changes across serial measurements may suggest ACS but many other  chronic and acute conditions are known to elevate  hsTnI results.  Refer to the "Links" section for chest pain algorithms and additional  guidance. Performed at [facility], [address] [place], [place] [address]   Troponin I (High Sensitivity)     Status: None   Collection Time: 07/25/23  9:56 PM  Result Value Ref Range   Troponin I (High Sensitivity) 3 <18 ng/L    Comment: (NOTE) Elevated high sensitivity troponin I (hsTnI) values and significant  changes across serial measurements may suggest ACS but many other  chronic and acute conditions are known to elevate hsTnI results.  Refer to the "Links" section for chest pain algorithms and additional  guidance. Performed at [facility], [address] [place], [place] [address]   CBC with Diff     Status: Abnormal   Collection Time: 08/16/23  1:28 PM  Result Value Ref Range   WBC 4.2 3.4 - 10.8 x10E3/uL   RBC 4.58 3.77 - 5.28 x10E6/uL   Hemoglobin 11.3 11.1 - 15.9 g/dL   Hematocrit 32.4 40.1 - 46.6 %   MCV 75 (L) 79 - 97 fL   MCH 24.7 (L) 26.6 - 33.0 pg   MCHC 33.1 31.5 - 35.7 g/dL   RDW 02.7 25.3 - 66.4 %   Platelets 191 150 - 450 x10E3/uL   Neutrophils 40 Not Estab. %   Lymphs 43 Not Estab. %   Monocytes 14 Not Estab. %   Eos 2 Not Estab. %   Basos 1 Not Estab. %   Neutrophils Absolute 1.7 1.4 - 7.0 x10E3/uL   Lymphocytes Absolute 1.8 0.7 - 3.1 x10E3/uL   Monocytes Absolute 0.6 0.1 - 0.9 x10E3/uL   EOS (ABSOLUTE) 0.1 0.0 - 0.4 x10E3/uL   Basophils Absolute 0.0 0.0 - 0.2 x10E3/uL   Immature Granulocytes 0 Not Estab. %   Immature Grans (Abs) 0.0 0.0 - 0.1 x10E3/uL  TSH+T4F+T3Free     Status: None   Collection Time: 08/16/23  1:28 PM  Result Value Ref Range   TSH 1.060 0.450 - 4.500 uIU/mL   T3, Free 3.8 2.0 - 4.4 pg/mL   Free T4 1.36 0.82 - 1.77 ng/dL  Culture, Stool     Status: None   Collection Time: 08/20/23  8:08 AM   Specimen: Stool   ST  Result Value Ref Range   Salmonella/Shigella Screen Final report    Stool Culture result 1  (RSASHR) Comment     Comment: No Salmonella or Shigella recovered.   Campylobacter Culture Final report    Stool Culture result 1 (CMPCXR) Comment     Comment: No Campylobacter species isolated.   E coli, Shiga toxin Assay Negative Negative  Ova and parasite examination     Status: None   Collection Time: 08/20/23  8:08 AM   Specimen: Stool   ST  Result Value Ref Range   OVA + PARASITE EXAM Final report     Comment: These results were obtained using wet preparation(s) and trichrome stained smear. This test does not include testing for Cryptosporidium parvum, Cyclospora, or Microsporidia.    O&P result 1 Comment     Comment: No ova, cysts, or parasites seen. One negative specimen does not rule out the possibility of a parasitic infection.       Assessment & Plan:  Continue current medications.  Monitor blood pressure at home.  Repeat stool specimen for ova and parasites. Problem List Items Addressed This Visit     Mixed  hyperlipidemia   Essential hypertension, benign - Primary   Absolute anemia   Relevant Orders   Ova and parasite examination   Gastroesophageal reflux disease without esophagitis   Relevant Medications   senna-docusate (SENOKOT-S) 8.6-50 MG tablet   pantoprazole  (PROTONIX ) 40 MG tablet    Return in about 6 weeks (around 10/18/2023).   Total time spent: 30 minutes  Aisha Hove, MD  09/06/2023   This document may have been prepared by [facility] Voice Recognition software and as such may include unintentional dictation errors.

## 2023-09-13 ENCOUNTER — Encounter: Payer: Self-pay | Admitting: Cardiology

## 2023-09-13 ENCOUNTER — Ambulatory Visit (INDEPENDENT_AMBULATORY_CARE_PROVIDER_SITE_OTHER): Admitting: Cardiology

## 2023-09-13 VITALS — BP 110/60 | HR 66 | Ht 62.0 in | Wt 180.2 lb

## 2023-09-13 DIAGNOSIS — Z0130 Encounter for examination of blood pressure without abnormal findings: Secondary | ICD-10-CM | POA: Diagnosis not present

## 2023-09-13 DIAGNOSIS — R7303 Prediabetes: Secondary | ICD-10-CM

## 2023-09-13 DIAGNOSIS — J069 Acute upper respiratory infection, unspecified: Secondary | ICD-10-CM | POA: Diagnosis not present

## 2023-09-13 DIAGNOSIS — R051 Acute cough: Secondary | ICD-10-CM

## 2023-09-13 MED ORDER — FLUTICASONE PROPIONATE 50 MCG/ACT NA SUSP: 1.0000 | Freq: Every day | NASAL | 2 refills | Status: DC

## 2023-09-13 MED ORDER — AZITHROMYCIN 250 MG PO TABS: ORAL_TABLET | ORAL | 0 refills | Status: AC

## 2023-09-13 NOTE — Progress Notes (Signed)
 Established Patient Office Visit  Subjective:  Patient ID: Tammy Hendricks, female    DOB: 1936/04/13  Age: 88 y.o. MRN: [id number]  Chief Complaint  Patient presents with   Acute Visit    Cold feet and possible fever    Patient in office for an acute visit. Patient complaining of cold feet. Thought she had a fever yesterday, temperature was normal. Patient complains of cough, yellow sputum, ear congestion, chills, nasal congestion, scratchy throat, and runny nose. Is taking Claritin, has not tried any other OTC medications.  Will send in a Z-pack and Flonase.   Cough This is a new problem. The current episode started in the past 7 days. The problem has been unchanged. The cough is Productive of sputum. Associated symptoms include chills, ear congestion, nasal congestion, rhinorrhea and a sore throat. Pertinent negatives include no chest pain, ear pain, fever, headaches, myalgias, postnasal drip or shortness of breath. The symptoms are aggravated by pollens. She has tried nothing for the symptoms. The treatment provided no relief.    No other concerns at this time.   Past Medical History:  Diagnosis Date   Arthritis    right knee   Hypertension    Pre-diabetes    Sciatica    left side - occasional   Vertigo    sporadic   Wears dentures    partial upper, full lower    Past Surgical History:  Procedure Laterality Date   ABDOMINAL HYSTERECTOMY     APPENDECTOMY     CATARACT EXTRACTION W/PHACO Right 11/09/2015   Procedure: CATARACT EXTRACTION PHACO AND INTRAOCULAR LENS PLACEMENT (IOC) right eye;  Surgeon: Annell Kidney, MD;  Location: [facility];  Service: Ophthalmology;  Laterality: Right;   CATARACT EXTRACTION W/PHACO Left 12/03/2017   Procedure: CATARACT EXTRACTION PHACO AND INTRAOCULAR LENS PLACEMENT (IOC) LEFT EYE;  Surgeon: Annell Kidney, MD;  Location: [facility];  Service: Ophthalmology;  Laterality: Left;   CHOLECYSTECTOMY      COLONOSCOPY     OOPHORECTOMY     TONSILLECTOMY      Social History   Socioeconomic History   Marital status: Widowed    Spouse name: Not on file   Number of children: Not on file   Years of education: Not on file   Highest education level: Not on file  Occupational History   Not on file  Tobacco Use   Smoking status: Never   Smokeless tobacco: Never  Vaping Use   Vaping status: Never Used  Substance and Sexual Activity   Alcohol use: No   Drug use: Not on file   Sexual activity: Not on file  Other Topics Concern   Not on file  Social History Narrative   Not on file   Social Drivers of Health   Financial Resource Strain: Low Risk  (04/02/2023)   Overall Financial Resource Strain (CARDIA)    Difficulty of Paying Living Expenses: Not very hard  Food Insecurity: No Food Insecurity (04/02/2023)   Hunger Vital Sign    Worried About Running Out of Food in the Last Year: Never true    Ran Out of Food in the Last Year: Never true  Transportation Needs: No Transportation Needs (04/02/2023)   PRAPARE - Administrator, Civil Service (Medical): No    Lack of Transportation (Non-Medical): No  Physical Activity: Not on file  Stress: Not on file  Social Connections: Not on file  Intimate Partner Violence: Not on file    Family  History  Problem Relation Age of Onset   Dementia Mother    Hypertension Father     No Known Allergies  Outpatient Medications Prior to Visit  Medication Sig   acetaminophen (TYLENOL) 650 MG CR tablet Take 650 mg by mouth every 8 (eight) hours as needed for pain.   aspirin EC 81 MG tablet Take 81 mg by mouth daily. Swallow whole.   brimonidine  (ALPHAGAN ) 0.2 % ophthalmic solution Place 1 drop into both eyes 2 (two) times daily.   hydrochlorothiazide  (HYDRODIURIL ) 25 MG tablet TAKE 1 TABLET BY MOUTH ONCE  DAILY   Krill Oil 500 MG CAPS Take 1 capsule by mouth daily.   latanoprost (XALATAN) 0.005 % ophthalmic solution Place 1 drop into  both eyes at bedtime.   loratadine (CLARITIN) 10 MG tablet Take 10 mg by mouth daily.   meloxicam  (MOBIC ) 15 MG tablet Take 1 tablet (15 mg total) by mouth daily.   pantoprazole  (PROTONIX ) 40 MG tablet Take 1 tablet (40 mg total) by mouth daily.   potassium chloride  SA (KLOR-CON  M) 20 MEQ tablet Take 1 tablet (20 mEq total) by mouth daily.   senna-docusate (SENOKOT-S) 8.6-50 MG tablet Take 1 tablet by mouth daily.   timolol  (TIMOPTIC ) 0.5 % ophthalmic solution    benzonatate  (TESSALON  PERLES) 100 MG capsule Take 1 capsule (100 mg total) by mouth 3 (three) times daily as needed for cough. (Patient not taking: Reported on 09/13/2023)   No facility-administered medications prior to visit.    Review of Systems  Constitutional:  Positive for chills. Negative for fever.  HENT:  Positive for congestion, rhinorrhea and sore throat. Negative for ear pain, postnasal drip and sinus pain.   Eyes: Negative.   Respiratory:  Positive for cough and sputum production. Negative for shortness of breath.   Cardiovascular: Negative.  Negative for chest pain.  Gastrointestinal: Negative.  Negative for abdominal pain, constipation and diarrhea.  Genitourinary: Negative.   Musculoskeletal:  Negative for joint pain and myalgias.  Skin: Negative.   Neurological: Negative.  Negative for dizziness and headaches.  Endo/Heme/Allergies: Negative.   All other systems reviewed and are negative.      Objective:   BP 110/60   Pulse 66   Ht 5\' 2"  (1.575 m)   Wt 180 lb 3.2 oz (81.7 kg)   SpO2 94%   BMI 32.96 kg/m   Vitals:   09/13/23 0945  BP: 110/60  Pulse: 66  Height: 5\' 2"  (1.575 m)  Weight: 180 lb 3.2 oz (81.7 kg)  SpO2: 94%  BMI (Calculated): 32.95    Physical Exam Vitals and nursing note reviewed.  Constitutional:      Appearance: Normal appearance. She is normal weight.  HENT:     Head: Normocephalic and atraumatic.     Nose: Nose normal.     Mouth/Throat:     Mouth: Mucous membranes are  moist.  Eyes:     Extraocular Movements: Extraocular movements intact.     Conjunctiva/sclera: Conjunctivae normal.     Pupils: Pupils are equal, round, and reactive to light.  Cardiovascular:     Rate and Rhythm: Normal rate and regular rhythm.     Pulses: Normal pulses.     Heart sounds: Normal heart sounds.  Pulmonary:     Effort: Pulmonary effort is normal.     Breath sounds: Normal breath sounds.  Abdominal:     General: Abdomen is flat. Bowel sounds are normal.     Palpations: Abdomen is soft.  Musculoskeletal:  General: Normal range of motion.     Cervical back: Normal range of motion.  Skin:    General: Skin is warm and dry.  Neurological:     General: No focal deficit present.     Mental Status: She is alert and oriented to person, place, and time.  Psychiatric:        Mood and Affect: Mood normal.        Behavior: Behavior normal.        Thought Content: Thought content normal.        Judgment: Judgment normal.      No results found for any visits on 09/13/23.  Recent Results (from the past 2160 hours)  CMP14+EGFR     Status: Abnormal   Collection Time: 07/05/23  3:38 PM  Result Value Ref Range   Glucose 104 (H) 70 - 99 mg/dL   BUN 11 8 - 27 mg/dL   Creatinine, Ser 1.47 0.57 - 1.00 mg/dL   eGFR 83 >82 NF/AOZ/3.08   BUN/Creatinine Ratio 15 12 - 28   Sodium 142 134 - 144 mmol/L   Potassium 3.2 (L) 3.5 - 5.2 mmol/L   Chloride 100 96 - 106 mmol/L   CO2 27 20 - 29 mmol/L   Calcium 9.4 8.7 - 10.3 mg/dL   Total Protein 7.3 6.0 - 8.5 g/dL   Albumin 4.1 3.7 - 4.7 g/dL   Globulin, Total 3.2 1.5 - 4.5 g/dL   Bilirubin Total 0.5 0.0 - 1.2 mg/dL   Alkaline Phosphatase 78 44 - 121 IU/L   AST 20 0 - 40 IU/L   ALT 22 0 - 32 IU/L  Lipid Panel w/o Chol/HDL Ratio     Status: None   Collection Time: 07/05/23  3:38 PM  Result Value Ref Range   Cholesterol, Total 164 100 - 199 mg/dL   Triglycerides 94 0 - 149 mg/dL   HDL 55 >65 mg/dL   VLDL Cholesterol Cal 17 5  - 40 mg/dL   LDL Chol Calc (NIH) 92 0 - 99 mg/dL  CBC with Diff     Status: Abnormal   Collection Time: 07/05/23  3:38 PM  Result Value Ref Range   WBC 4.2 3.4 - 10.8 x10E3/uL   RBC 4.60 3.77 - 5.28 x10E6/uL   Hemoglobin 11.2 11.1 - 15.9 g/dL   Hematocrit 78.4 69.6 - 46.6 %   MCV 77 (L) 79 - 97 fL   MCH 24.3 (L) 26.6 - 33.0 pg   MCHC 31.8 31.5 - 35.7 g/dL   RDW 29.5 28.4 - 13.2 %   Platelets 210 150 - 450 x10E3/uL   Neutrophils 38 Not Estab. %   Lymphs 50 Not Estab. %   Monocytes 11 Not Estab. %   Eos 1 Not Estab. %   Basos 0 Not Estab. %   Neutrophils Absolute 1.6 1.4 - 7.0 x10E3/uL   Lymphocytes Absolute 2.1 0.7 - 3.1 x10E3/uL   Monocytes Absolute 0.5 0.1 - 0.9 x10E3/uL   EOS (ABSOLUTE) 0.1 0.0 - 0.4 x10E3/uL   Basophils Absolute 0.0 0.0 - 0.2 x10E3/uL   Immature Granulocytes 0 Not Estab. %   Immature Grans (Abs) 0.0 0.0 - 0.1 x10E3/uL  Hemoglobin A1c     Status: Abnormal   Collection Time: 07/05/23  3:38 PM  Result Value Ref Range   Hgb A1c MFr Bld 6.4 (H) 4.8 - 5.6 %    Comment:          Prediabetes: 5.7 - 6.4  Diabetes: >6.4          Glycemic control for adults with diabetes: <7.0    Est. average glucose Bld gHb Est-mCnc 137 mg/dL  Basic metabolic panel     Status: Abnormal   Collection Time: 07/25/23  6:37 PM  Result Value Ref Range   Sodium 140 135 - 145 mmol/L   Potassium 3.7 3.5 - 5.1 mmol/L   Chloride 104 98 - 111 mmol/L   CO2 26 22 - 32 mmol/L   Glucose, Bld 111 (H) 70 - 99 mg/dL    Comment: Glucose reference range applies only to samples taken after fasting for at least 8 hours.   BUN 13 8 - 23 mg/dL   Creatinine, Ser 1.61 0.44 - 1.00 mg/dL   Calcium 9.4 8.9 - 09.6 mg/dL   GFR, Estimated >04 >54 mL/min    Comment: (NOTE) Calculated using the CKD-EPI Creatinine Equation (2021)    Anion gap 10 5 - 15    Comment: Performed at [facility], [address] [place], [place] [address]  CBC     Status: Abnormal   Collection Time: 07/25/23   6:37 PM  Result Value Ref Range   WBC 3.8 (L) 4.0 - 10.5 K/uL   RBC 4.50 3.87 - 5.11 MIL/uL   Hemoglobin 10.9 (L) 12.0 - 15.0 g/dL   HCT 91.4 (L) 78.2 - 95.6 %   MCV 75.1 (L) 80.0 - 100.0 fL   MCH 24.2 (L) 26.0 - 34.0 pg   MCHC 32.2 30.0 - 36.0 g/dL   RDW 21.3 08.6 - 57.8 %   Platelets 220 150 - 400 K/uL   nRBC 0.0 0.0 - 0.2 %    Comment: Performed at [facility], [address] [place], [place] [address]  Urinalysis, Routine w reflex microscopic -Urine, Clean Catch     Status: Abnormal   Collection Time: 07/25/23  6:37 PM  Result Value Ref Range   Color, Urine YELLOW (A) YELLOW   APPearance HAZY (A) CLEAR   Specific Gravity, Urine 1.006 1.005 - 1.030   pH 8.0 5.0 - 8.0   Glucose, UA NEGATIVE NEGATIVE mg/dL   Hgb urine dipstick NEGATIVE NEGATIVE   Bilirubin Urine NEGATIVE NEGATIVE   Ketones, ur NEGATIVE NEGATIVE mg/dL   Protein, ur NEGATIVE NEGATIVE mg/dL   Nitrite NEGATIVE NEGATIVE   Leukocytes,Ua SMALL (A) NEGATIVE   RBC / HPF 0-5 0 - 5 RBC/hpf   WBC, UA 11-20 0 - 5 WBC/hpf   Bacteria, UA RARE (A) NONE SEEN   Squamous Epithelial / HPF 0-5 0 - 5 /HPF   Mucus PRESENT     Comment: Performed at [facility], [address] [place], [place] [address]  Troponin I (High Sensitivity)     Status: None   Collection Time: 07/25/23  6:37 PM  Result Value Ref Range   Troponin I (High Sensitivity) 3 <18 ng/L    Comment: (NOTE) Elevated high sensitivity troponin I (hsTnI) values and significant  changes across serial measurements may suggest ACS but many other  chronic and acute conditions are known to elevate hsTnI results.  Refer to the "Links" section for chest pain algorithms and additional  guidance. Performed at [facility], [address] [place], [place] [address]   Troponin I (High Sensitivity)     Status: None   Collection Time: 07/25/23  9:56 PM  Result Value Ref Range   Troponin I (High Sensitivity) 3 <18 ng/L    Comment:  (  NOTE) Elevated high sensitivity troponin I (hsTnI) values and significant  changes across serial measurements may suggest ACS but many other  chronic and acute conditions are known to elevate hsTnI results.  Refer to the "Links" section for chest pain algorithms and additional  guidance. Performed at [facility], [address] [place], [place] [address]   CBC with Diff     Status: Abnormal   Collection Time: 08/16/23  1:28 PM  Result Value Ref Range   WBC 4.2 3.4 - 10.8 x10E3/uL   RBC 4.58 3.77 - 5.28 x10E6/uL   Hemoglobin 11.3 11.1 - 15.9 g/dL   Hematocrit 60.4 54.0 - 46.6 %   MCV 75 (L) 79 - 97 fL   MCH 24.7 (L) 26.6 - 33.0 pg   MCHC 33.1 31.5 - 35.7 g/dL   RDW 98.1 19.1 - 47.8 %   Platelets 191 150 - 450 x10E3/uL   Neutrophils 40 Not Estab. %   Lymphs 43 Not Estab. %   Monocytes 14 Not Estab. %   Eos 2 Not Estab. %   Basos 1 Not Estab. %   Neutrophils Absolute 1.7 1.4 - 7.0 x10E3/uL   Lymphocytes Absolute 1.8 0.7 - 3.1 x10E3/uL   Monocytes Absolute 0.6 0.1 - 0.9 x10E3/uL   EOS (ABSOLUTE) 0.1 0.0 - 0.4 x10E3/uL   Basophils Absolute 0.0 0.0 - 0.2 x10E3/uL   Immature Granulocytes 0 Not Estab. %   Immature Grans (Abs) 0.0 0.0 - 0.1 x10E3/uL  TSH+T4F+T3Free     Status: None   Collection Time: 08/16/23  1:28 PM  Result Value Ref Range   TSH 1.060 0.450 - 4.500 uIU/mL   T3, Free 3.8 2.0 - 4.4 pg/mL   Free T4 1.36 0.82 - 1.77 ng/dL  Culture, Stool     Status: None   Collection Time: 08/20/23  8:08 AM   Specimen: Stool   ST  Result Value Ref Range   Salmonella/Shigella Screen Final report    Stool Culture result 1 (RSASHR) Comment     Comment: No Salmonella or Shigella recovered.   Campylobacter Culture Final report    Stool Culture result 1 (CMPCXR) Comment     Comment: No Campylobacter species isolated.   E coli, Shiga toxin Assay Negative Negative  Ova and parasite examination     Status: None   Collection Time: 08/20/23  8:08 AM   Specimen: Stool   ST   Result Value Ref Range   OVA + PARASITE EXAM Final report     Comment: These results were obtained using wet preparation(s) and trichrome stained smear. This test does not include testing for Cryptosporidium parvum, Cyclospora, or Microsporidia.    O&P result 1 Comment     Comment: No ova, cysts, or parasites seen. One negative specimen does not rule out the possibility of a parasitic infection.       Assessment & Plan:  Z-pack Flonase  Problem List Items Addressed This Visit       Respiratory   Acute URI - Primary   Relevant Medications   azithromycin (ZITHROMAX Z-PAK) 250 MG tablet    Return if symptoms worsen or fail to improve.   Total time spent: 25 minutes  Google, NP  09/13/2023   This document may have been prepared by Dragon Voice Recognition software and as such may include unintentional dictation errors.

## 2023-10-03 ENCOUNTER — Ambulatory Visit: Admitting: Internal Medicine

## 2023-10-03 ENCOUNTER — Encounter: Payer: Self-pay | Admitting: Internal Medicine

## 2023-10-03 VITALS — BP 108/68 | HR 62 | Ht 62.0 in | Wt 173.2 lb

## 2023-10-03 DIAGNOSIS — J301 Allergic rhinitis due to pollen: Secondary | ICD-10-CM

## 2023-10-03 DIAGNOSIS — R29898 Other symptoms and signs involving the musculoskeletal system: Secondary | ICD-10-CM | POA: Diagnosis not present

## 2023-10-03 DIAGNOSIS — I10 Essential (primary) hypertension: Secondary | ICD-10-CM | POA: Diagnosis not present

## 2023-10-03 DIAGNOSIS — K219 Gastro-esophageal reflux disease without esophagitis: Secondary | ICD-10-CM

## 2023-10-03 MED ORDER — FLUTICASONE PROPIONATE 50 MCG/ACT NA SUSP: 1.0000 | Freq: Every day | NASAL | 2 refills | Status: DC

## 2023-10-03 MED ORDER — PANTOPRAZOLE SODIUM 40 MG PO TBEC: 40.0000 mg | DELAYED_RELEASE_TABLET | Freq: Every day | ORAL | 2 refills | Status: AC

## 2023-10-03 NOTE — Progress Notes (Signed)
 Established Patient Office Visit  Subjective:  Patient ID: Tammy Hendricks, female    DOB: 07/25/1935  Age: 88 y.o. MRN: [id number]  Chief Complaint  Patient presents with   Dizziness   Fall    Patient comes in for her follow-up today accompanied by her family member.  She was recently complaining of cough for which she was started on Protonix  and allergy medications.  She is taking her Claritin but did not pick up the Flonase  nasal spray.  However her cough has improved but not completely gone.  Today they mentioned that the patient has complained of intermittent dizziness and her blood pressure has been running low.  Previously it was high but the only medicine she can tolerate is hydrochlorothiazide  at 25 mg/day.  Patient was advised to cut her pill in half and only take 12.5 mg daily. She is also complaining of an episode where her right leg felt completely numb and and weak.  She felt the loss of strength and  fell down but did not get hurt -she laid there until someone came to help her get up, and then was able to support herself and walk with her cane.  She has history of arthritis but was not having severe knee pain or ankle pain.  She did not lose consciousness and did not lose strength in any other part of the body.  Will get a neurology evaluation.    No other concerns at this time.   Past Medical History:  Diagnosis Date   Arthritis    right knee   Hypertension    Pre-diabetes    Sciatica    left side - occasional   Vertigo    sporadic   Wears dentures    partial upper, full lower    Past Surgical History:  Procedure Laterality Date   ABDOMINAL HYSTERECTOMY     APPENDECTOMY     CATARACT EXTRACTION W/PHACO Right 11/09/2015   Procedure: CATARACT EXTRACTION PHACO AND INTRAOCULAR LENS PLACEMENT (IOC) right eye;  Surgeon: Annell Kidney, MD;  Location: [facility];  Service: Ophthalmology;  Laterality: Right;   CATARACT EXTRACTION W/PHACO Left 12/03/2017    Procedure: CATARACT EXTRACTION PHACO AND INTRAOCULAR LENS PLACEMENT (IOC) LEFT EYE;  Surgeon: Annell Kidney, MD;  Location: [facility];  Service: Ophthalmology;  Laterality: Left;   CHOLECYSTECTOMY     COLONOSCOPY     OOPHORECTOMY     TONSILLECTOMY      Social History   Socioeconomic History   Marital status: Widowed    Spouse name: Not on file   Number of children: Not on file   Years of education: Not on file   Highest education level: Not on file  Occupational History   Not on file  Tobacco Use   Smoking status: Never   Smokeless tobacco: Never  Vaping Use   Vaping status: Never Used  Substance and Sexual Activity   Alcohol use: No   Drug use: Not on file   Sexual activity: Not on file  Other Topics Concern   Not on file  Social History Narrative   Not on file   Social Drivers of Health   Financial Resource Strain: Low Risk  (04/02/2023)   Overall Financial Resource Strain (CARDIA)    Difficulty of Paying Living Expenses: Not very hard  Food Insecurity: No Food Insecurity (04/02/2023)   Hunger Vital Sign    Worried About Running Out of Food in the Last Year: Never true  Ran Out of Food in the Last Year: Never true  Transportation Needs: No Transportation Needs (04/02/2023)   PRAPARE - Administrator, Civil Service (Medical): No    Lack of Transportation (Non-Medical): No  Physical Activity: Not on file  Stress: Not on file  Social Connections: Not on file  Intimate Partner Violence: Not on file    Family History  Problem Relation Age of Onset   Dementia Mother    Hypertension Father     No Known Allergies  Outpatient Medications Prior to Visit  Medication Sig   acetaminophen (TYLENOL) 650 MG CR tablet Take 650 mg by mouth every 8 (eight) hours as needed for pain.   aspirin EC 81 MG tablet Take 81 mg by mouth daily. Swallow whole.   brimonidine  (ALPHAGAN ) 0.2 % ophthalmic solution Place 1 drop into both eyes 2 (two) times  daily.   hydrochlorothiazide  (HYDRODIURIL ) 25 MG tablet TAKE 1 TABLET BY MOUTH ONCE  DAILY   Krill Oil 500 MG CAPS Take 1 capsule by mouth daily.   latanoprost (XALATAN) 0.005 % ophthalmic solution Place 1 drop into both eyes at bedtime.   loratadine (CLARITIN) 10 MG tablet Take 10 mg by mouth daily.   meloxicam  (MOBIC ) 15 MG tablet Take 1 tablet (15 mg total) by mouth daily.   potassium chloride  SA (KLOR-CON  M) 20 MEQ tablet Take 1 tablet (20 mEq total) by mouth daily.   senna-docusate (SENOKOT-S) 8.6-50 MG tablet Take 1 tablet by mouth daily.   timolol  (TIMOPTIC ) 0.5 % ophthalmic solution    [DISCONTINUED] fluticasone  (FLONASE ) 50 MCG/ACT nasal spray Place 1 spray into both nostrils daily.   [DISCONTINUED] pantoprazole  (PROTONIX ) 40 MG tablet Take 1 tablet (40 mg total) by mouth daily.   benzonatate  (TESSALON  PERLES) 100 MG capsule Take 1 capsule (100 mg total) by mouth 3 (three) times daily as needed for cough. (Patient not taking: Reported on 10/03/2023)   No facility-administered medications prior to visit.    Review of Systems  Constitutional: Negative.  Negative for chills, diaphoresis, malaise/fatigue and weight loss.  HENT:  Positive for hearing loss. Negative for sore throat.   Eyes: Negative.   Respiratory: Negative.  Negative for cough and shortness of breath.   Cardiovascular: Negative.  Negative for chest pain, palpitations and leg swelling.  Gastrointestinal: Negative.  Negative for constipation, diarrhea and heartburn.  Genitourinary: Negative.  Negative for dysuria and flank pain.  Musculoskeletal: Negative.  Negative for joint pain and myalgias.  Skin: Negative.   Neurological:  Positive for dizziness and weakness.  Endo/Heme/Allergies: Negative.   Psychiatric/Behavioral: Negative.  Negative for depression and suicidal ideas. The patient is not nervous/anxious.        Objective:   BP 108/68   Pulse 62   Ht 5' 2 (1.575 m)   Wt 173 lb 3.2 oz (78.6 kg)   SpO2 95%    BMI 31.68 kg/m   Vitals:   10/03/23 1406  BP: 108/68  Pulse: 62  Height: 5' 2 (1.575 m)  Weight: 173 lb 3.2 oz (78.6 kg)  SpO2: 95%  BMI (Calculated): 31.67    Physical Exam Vitals and nursing note reviewed.  Constitutional:      Appearance: Normal appearance.  HENT:     Head: Normocephalic and atraumatic.     Nose: Nose normal.     Mouth/Throat:     Mouth: Mucous membranes are moist.     Pharynx: Oropharynx is clear.   Eyes:     Conjunctiva/sclera:  Conjunctivae normal.     Pupils: Pupils are equal, round, and reactive to light.    Cardiovascular:     Rate and Rhythm: Normal rate and regular rhythm.     Pulses: Normal pulses.     Heart sounds: Normal heart sounds. No murmur heard. Pulmonary:     Effort: Pulmonary effort is normal.     Breath sounds: Normal breath sounds. No wheezing.  Abdominal:     General: Bowel sounds are normal.     Palpations: Abdomen is soft.     Tenderness: There is no abdominal tenderness. There is no right CVA tenderness or left CVA tenderness.   Musculoskeletal:        General: Normal range of motion.     Cervical back: Normal range of motion.     Right lower leg: No edema.     Left lower leg: No edema.   Skin:    General: Skin is warm and dry.   Neurological:     General: No focal deficit present.     Mental Status: She is alert and oriented to person, place, and time.   Psychiatric:        Mood and Affect: Mood normal.        Behavior: Behavior normal.      No results found for any visits on 10/03/23.  Recent Results (from the past 2160 hours)  Basic metabolic panel     Status: Abnormal   Collection Time: 07/25/23  6:37 PM  Result Value Ref Range   Sodium 140 135 - 145 mmol/L   Potassium 3.7 3.5 - 5.1 mmol/L   Chloride 104 98 - 111 mmol/L   CO2 26 22 - 32 mmol/L   Glucose, Bld 111 (H) 70 - 99 mg/dL    Comment: Glucose reference range applies only to samples taken after fasting for at least 8 hours.   BUN 13 8 -  23 mg/dL   Creatinine, Ser 1.61 0.44 - 1.00 mg/dL   Calcium 9.4 8.9 - 09.6 mg/dL   GFR, Estimated >04 >54 mL/min    Comment: (NOTE) Calculated using the CKD-EPI Creatinine Equation (2021)    Anion gap 10 5 - 15    Comment: Performed at [facility], [address] [place], [place] [address]  CBC     Status: Abnormal   Collection Time: 07/25/23  6:37 PM  Result Value Ref Range   WBC 3.8 (L) 4.0 - 10.5 K/uL   RBC 4.50 3.87 - 5.11 MIL/uL   Hemoglobin 10.9 (L) 12.0 - 15.0 g/dL   HCT 91.4 (L) 78.2 - 95.6 %   MCV 75.1 (L) 80.0 - 100.0 fL   MCH 24.2 (L) 26.0 - 34.0 pg   MCHC 32.2 30.0 - 36.0 g/dL   RDW 21.3 08.6 - 57.8 %   Platelets 220 150 - 400 K/uL   nRBC 0.0 0.0 - 0.2 %    Comment: Performed at [facility], [address] [place], [place] [address]  Urinalysis, Routine w reflex microscopic -Urine, Clean Catch     Status: Abnormal   Collection Time: 07/25/23  6:37 PM  Result Value Ref Range   Color, Urine YELLOW (A) YELLOW   APPearance HAZY (A) CLEAR   Specific Gravity, Urine 1.006 1.005 - 1.030   pH 8.0 5.0 - 8.0   Glucose, UA NEGATIVE NEGATIVE mg/dL   Hgb urine dipstick NEGATIVE NEGATIVE   Bilirubin Urine NEGATIVE NEGATIVE   Ketones, ur NEGATIVE NEGATIVE mg/dL  Protein, ur NEGATIVE NEGATIVE mg/dL   Nitrite NEGATIVE NEGATIVE   Leukocytes,Ua SMALL (A) NEGATIVE   RBC / HPF 0-5 0 - 5 RBC/hpf   WBC, UA 11-20 0 - 5 WBC/hpf   Bacteria, UA RARE (A) NONE SEEN   Squamous Epithelial / HPF 0-5 0 - 5 /HPF   Mucus PRESENT     Comment: Performed at [facility], [address] [place], [place] [address]  Troponin I (High Sensitivity)     Status: None   Collection Time: 07/25/23  6:37 PM  Result Value Ref Range   Troponin I (High Sensitivity) 3 <18 ng/L    Comment: (NOTE) Elevated high sensitivity troponin I (hsTnI) values and significant  changes across serial measurements may suggest ACS but many other  chronic and acute conditions are known to  elevate hsTnI results.  Refer to the Links section for chest pain algorithms and additional  guidance. Performed at [facility] [address] [place], [place] [address]   Troponin I (High Sensitivity)     Status: None   Collection Time: 07/25/23  9:56 PM  Result Value Ref Range   Troponin I (High Sensitivity) 3 <18 ng/L    Comment: (NOTE) Elevated high sensitivity troponin I (hsTnI) values and significant  changes across serial measurements may suggest ACS but many other  chronic and acute conditions are known to elevate hsTnI results.  Refer to the Links section for chest pain algorithms and additional  guidance. Performed at [facility], [address] [place], [place] [address]   CBC with Diff     Status: Abnormal   Collection Time: 08/16/23  1:28 PM  Result Value Ref Range   WBC 4.2 3.4 - 10.8 x10E3/uL   RBC 4.58 3.77 - 5.28 x10E6/uL   Hemoglobin 11.3 11.1 - 15.9 g/dL   Hematocrit 87.5 64.3 - 46.6 %   MCV 75 (L) 79 - 97 fL   MCH 24.7 (L) 26.6 - 33.0 pg   MCHC 33.1 31.5 - 35.7 g/dL   RDW 32.9 51.8 - 84.1 %   Platelets 191 150 - 450 x10E3/uL   Neutrophils 40 Not Estab. %   Lymphs 43 Not Estab. %   Monocytes 14 Not Estab. %   Eos 2 Not Estab. %   Basos 1 Not Estab. %   Neutrophils Absolute 1.7 1.4 - 7.0 x10E3/uL   Lymphocytes Absolute 1.8 0.7 - 3.1 x10E3/uL   Monocytes Absolute 0.6 0.1 - 0.9 x10E3/uL   EOS (ABSOLUTE) 0.1 0.0 - 0.4 x10E3/uL   Basophils Absolute 0.0 0.0 - 0.2 x10E3/uL   Immature Granulocytes 0 Not Estab. %   Immature Grans (Abs) 0.0 0.0 - 0.1 x10E3/uL  TSH+T4F+T3Free     Status: None   Collection Time: 08/16/23  1:28 PM  Result Value Ref Range   TSH 1.060 0.450 - 4.500 uIU/mL   T3, Free 3.8 2.0 - 4.4 pg/mL   Free T4 1.36 0.82 - 1.77 ng/dL  Culture, Stool     Status: None   Collection Time: 08/20/23  8:08 AM   Specimen: Stool   ST  Result Value Ref Range   Salmonella/Shigella Screen Final report    Stool Culture  result 1 (RSASHR) Comment     Comment: No Salmonella or Shigella recovered.   Campylobacter Culture Final report    Stool Culture result 1 (CMPCXR) Comment     Comment: No Campylobacter species isolated.   E coli, Shiga toxin Assay Negative Negative  Ova  and parasite examination     Status: None   Collection Time: 08/20/23  8:08 AM   Specimen: Stool   ST  Result Value Ref Range   OVA + PARASITE EXAM Final report     Comment: These results were obtained using wet preparation(s) and trichrome stained smear. This test does not include testing for Cryptosporidium parvum, Cyclospora, or Microsporidia.    O&P result 1 Comment     Comment: No ova, cysts, or parasites seen. One negative specimen does not rule out the possibility of a parasitic infection.       Assessment & Plan:  Patient advised to cut her diuretic in half per day monitor blood pressure.  Neurology evaluation for episodic right lower leg weakness. Problem List Items Addressed This Visit     Essential hypertension, benign - Primary   Gastroesophageal reflux disease without esophagitis   Relevant Medications   pantoprazole  (PROTONIX ) 40 MG tablet   Other Visit Diagnoses       Seasonal allergic rhinitis due to pollen       Relevant Medications   fluticasone  (FLONASE ) 50 MCG/ACT nasal spray     Weakness of right lower extremity       Relevant Orders   Ambulatory referral to Neurology       Return in about 1 month (around 11/02/2023).   Total time spent: 30 minutes  Aisha Hove, MD  10/03/2023   This document may have been prepared by [facility] Voice Recognition software and as such may include unintentional dictation errors.

## 2023-10-18 ENCOUNTER — Ambulatory Visit: Admitting: Internal Medicine

## 2023-11-08 ENCOUNTER — Ambulatory Visit (INDEPENDENT_AMBULATORY_CARE_PROVIDER_SITE_OTHER): Admitting: Internal Medicine

## 2023-11-08 ENCOUNTER — Encounter: Payer: Self-pay | Admitting: Internal Medicine

## 2023-11-08 VITALS — BP 142/68 | HR 59 | Ht 62.0 in | Wt 175.0 lb

## 2023-11-08 DIAGNOSIS — K219 Gastro-esophageal reflux disease without esophagitis: Secondary | ICD-10-CM

## 2023-11-08 DIAGNOSIS — J301 Allergic rhinitis due to pollen: Secondary | ICD-10-CM | POA: Diagnosis not present

## 2023-11-08 DIAGNOSIS — E782 Mixed hyperlipidemia: Secondary | ICD-10-CM

## 2023-11-08 DIAGNOSIS — I10 Essential (primary) hypertension: Secondary | ICD-10-CM | POA: Diagnosis not present

## 2023-11-08 DIAGNOSIS — D500 Iron deficiency anemia secondary to blood loss (chronic): Secondary | ICD-10-CM

## 2023-11-08 NOTE — Progress Notes (Signed)
 Established Patient Office Visit  Subjective:  Patient ID: Tammy Hendricks, female    DOB: 11/12/1935  Age: 88 y.o. MRN: [id number]  Chief Complaint  Patient presents with   Follow-up    1 month follow up    Patient comes in for her follow-up today accompanied by a family member.  She is in good spirits and offers no new complaints.  She she is taking her medications as prescribed and did not have any further episodes of dizziness or lightheadedness.  No further cough or congestion.  She is trying to wear compression stockings regularly.  No headaches or dizziness, no chest pain, no shortness of breath and no palpitations. Patient was referred to neurology for an episode of right leg weakness, she has not heard from them yet.  But her symptoms have also resolved spontaneously.    No other concerns at this time.   Past Medical History:  Diagnosis Date   Arthritis    right knee   Hypertension    Pre-diabetes    Sciatica    left side - occasional   Vertigo    sporadic   Wears dentures    partial upper, full lower    Past Surgical History:  Procedure Laterality Date   ABDOMINAL HYSTERECTOMY     APPENDECTOMY     CATARACT EXTRACTION W/PHACO Right 11/09/2015   Procedure: CATARACT EXTRACTION PHACO AND INTRAOCULAR LENS PLACEMENT (IOC) right eye;  Surgeon: Dene Etienne, MD;  Location: [facility];  Service: Ophthalmology;  Laterality: Right;   CATARACT EXTRACTION W/PHACO Left 12/03/2017   Procedure: CATARACT EXTRACTION PHACO AND INTRAOCULAR LENS PLACEMENT (IOC) LEFT EYE;  Surgeon: Etienne Dene, MD;  Location: [facility];  Service: Ophthalmology;  Laterality: Left;   CHOLECYSTECTOMY     COLONOSCOPY     OOPHORECTOMY     TONSILLECTOMY      Social History   Socioeconomic History   Marital status: Widowed    Spouse name: Not on file   Number of children: Not on file   Years of education: Not on file   Highest education level: Not on file   Occupational History   Not on file  Tobacco Use   Smoking status: Never   Smokeless tobacco: Never  Vaping Use   Vaping status: Never Used  Substance and Sexual Activity   Alcohol use: No   Drug use: Not on file   Sexual activity: Not on file  Other Topics Concern   Not on file  Social History Narrative   Not on file   Social Drivers of Health   Financial Resource Strain: Low Risk  (04/02/2023)   Overall Financial Resource Strain (CARDIA)    Difficulty of Paying Living Expenses: Not very hard  Food Insecurity: No Food Insecurity (04/02/2023)   Hunger Vital Sign    Worried About Running Out of Food in the Last Year: Never true    Ran Out of Food in the Last Year: Never true  Transportation Needs: No Transportation Needs (04/02/2023)   PRAPARE - Administrator, Civil Service (Medical): No    Lack of Transportation (Non-Medical): No  Physical Activity: Not on file  Stress: Not on file  Social Connections: Not on file  Intimate Partner Violence: Not on file    Family History  Problem Relation Age of Onset   Dementia Mother    Hypertension Father     No Known Allergies  Outpatient Medications Prior to Visit  Medication Sig  acetaminophen (TYLENOL) 650 MG CR tablet Take 650 mg by mouth every 8 (eight) hours as needed for pain.   aspirin EC 81 MG tablet Take 81 mg by mouth daily. Swallow whole.   brimonidine  (ALPHAGAN ) 0.2 % ophthalmic solution Place 1 drop into both eyes 2 (two) times daily.   fluticasone  (FLONASE ) 50 MCG/ACT nasal spray Place 1 spray into both nostrils daily.   hydrochlorothiazide  (HYDRODIURIL ) 25 MG tablet TAKE 1 TABLET BY MOUTH ONCE  DAILY   Krill Oil 500 MG CAPS Take 1 capsule by mouth daily.   latanoprost (XALATAN) 0.005 % ophthalmic solution Place 1 drop into both eyes at bedtime.   loratadine (CLARITIN) 10 MG tablet Take 10 mg by mouth daily.   meloxicam  (MOBIC ) 15 MG tablet Take 1 tablet (15 mg total) by mouth daily.    pantoprazole  (PROTONIX ) 40 MG tablet Take 1 tablet (40 mg total) by mouth daily.   potassium chloride  SA (KLOR-CON  M) 20 MEQ tablet Take 1 tablet (20 mEq total) by mouth daily.   senna-docusate (SENOKOT-S) 8.6-50 MG tablet Take 1 tablet by mouth daily.   timolol  (TIMOPTIC ) 0.5 % ophthalmic solution    benzonatate  (TESSALON  PERLES) 100 MG capsule Take 1 capsule (100 mg total) by mouth 3 (three) times daily as needed for cough. (Patient not taking: Reported on 11/08/2023)   No facility-administered medications prior to visit.    Review of Systems  Constitutional: Negative.  Negative for chills, diaphoresis, fever, malaise/fatigue and weight loss.  HENT: Negative.  Negative for congestion and sore throat.   Eyes: Negative.   Respiratory: Negative.  Negative for cough and shortness of breath.   Cardiovascular: Negative.  Negative for chest pain, palpitations and leg swelling.  Gastrointestinal: Negative.  Negative for abdominal pain, constipation, diarrhea, heartburn, nausea and vomiting.  Genitourinary: Negative.  Negative for dysuria and flank pain.  Musculoskeletal: Negative.  Negative for joint pain and myalgias.  Skin: Negative.   Neurological: Negative.  Negative for dizziness, tingling, tremors and headaches.  Endo/Heme/Allergies: Negative.   Psychiatric/Behavioral: Negative.  Negative for depression and suicidal ideas. The patient is not nervous/anxious.        Objective:   BP (!) 142/68   Pulse (!) 59   Ht 5' 2 (1.575 m)   Wt 175 lb (79.4 kg)   SpO2 98%   BMI 32.01 kg/m   Vitals:   11/08/23 1312  BP: (!) 142/68  Pulse: (!) 59  Height: 5' 2 (1.575 m)  Weight: 175 lb (79.4 kg)  SpO2: 98%  BMI (Calculated): 32    Physical Exam Vitals and nursing note reviewed.  Constitutional:      Appearance: Normal appearance.  HENT:     Head: Normocephalic and atraumatic.     Nose: Nose normal.     Mouth/Throat:     Mouth: Mucous membranes are moist.     Pharynx:  Oropharynx is clear.  Eyes:     Conjunctiva/sclera: Conjunctivae normal.     Pupils: Pupils are equal, round, and reactive to light.  Cardiovascular:     Rate and Rhythm: Normal rate and regular rhythm.     Pulses: Normal pulses.     Heart sounds: Normal heart sounds. No murmur heard. Pulmonary:     Effort: Pulmonary effort is normal.     Breath sounds: Normal breath sounds. No wheezing.  Abdominal:     General: Bowel sounds are normal.     Palpations: Abdomen is soft.     Tenderness: There is no  abdominal tenderness. There is no right CVA tenderness or left CVA tenderness.  Musculoskeletal:        General: Normal range of motion.     Cervical back: Normal range of motion.     Right lower leg: No edema.     Left lower leg: No edema.  Skin:    General: Skin is warm and dry.  Neurological:     General: No focal deficit present.     Mental Status: She is alert and oriented to person, place, and time.  Psychiatric:        Mood and Affect: Mood normal.        Behavior: Behavior normal.      No results found for any visits on 11/08/23.  Recent Results (from the past 2160 hours)  CBC with Diff     Status: Abnormal   Collection Time: 08/16/23  1:28 PM  Result Value Ref Range   WBC 4.2 3.4 - 10.8 x10E3/uL   RBC 4.58 3.77 - 5.28 x10E6/uL   Hemoglobin 11.3 11.1 - 15.9 g/dL   Hematocrit 65.8 65.9 - 46.6 %   MCV 75 (L) 79 - 97 fL   MCH 24.7 (L) 26.6 - 33.0 pg   MCHC 33.1 31.5 - 35.7 g/dL   RDW 86.9 88.2 - 84.5 %   Platelets 191 150 - 450 x10E3/uL   Neutrophils 40 Not Estab. %   Lymphs 43 Not Estab. %   Monocytes 14 Not Estab. %   Eos 2 Not Estab. %   Basos 1 Not Estab. %   Neutrophils Absolute 1.7 1.4 - 7.0 x10E3/uL   Lymphocytes Absolute 1.8 0.7 - 3.1 x10E3/uL   Monocytes Absolute 0.6 0.1 - 0.9 x10E3/uL   EOS (ABSOLUTE) 0.1 0.0 - 0.4 x10E3/uL   Basophils Absolute 0.0 0.0 - 0.2 x10E3/uL   Immature Granulocytes 0 Not Estab. %   Immature Grans (Abs) 0.0 0.0 - 0.1 x10E3/uL   TSH+T4F+T3Free     Status: None   Collection Time: 08/16/23  1:28 PM  Result Value Ref Range   TSH 1.060 0.450 - 4.500 uIU/mL   T3, Free 3.8 2.0 - 4.4 pg/mL   Free T4 1.36 0.82 - 1.77 ng/dL  Culture, Stool     Status: None   Collection Time: 08/20/23  8:08 AM   Specimen: Stool   ST  Result Value Ref Range   Salmonella/Shigella Screen Final report    Stool Culture result 1 (RSASHR) Comment     Comment: No Salmonella or Shigella recovered.   Campylobacter Culture Final report    Stool Culture result 1 (CMPCXR) Comment     Comment: No Campylobacter species isolated.   E coli, Shiga toxin Assay Negative Negative  Ova and parasite examination     Status: None   Collection Time: 08/20/23  8:08 AM   Specimen: Stool   ST  Result Value Ref Range   OVA + PARASITE EXAM Final report     Comment: These results were obtained using wet preparation(s) and trichrome stained smear. This test does not include testing for Cryptosporidium parvum, Cyclospora, or Microsporidia.    O&P result 1 Comment     Comment: No ova, cysts, or parasites seen. One negative specimen does not rule out the possibility of a parasitic infection.       Assessment & Plan:  Continue current medications.  Monitor blood pressure at home. Problem List Items Addressed This Visit     Mixed hyperlipidemia   Essential hypertension,  benign   Absolute anemia   Gastroesophageal reflux disease without esophagitis - Primary   Other Visit Diagnoses       Seasonal allergic rhinitis due to pollen           Return in about 2 months (around 01/09/2024).   Total time spent: 30 minutes  FERNAND FREDY RAMAN, MD  11/08/2023   This document may have been prepared by [facility] Voice Recognition software and as such may include unintentional dictation errors.

## 2023-11-23 ENCOUNTER — Emergency Department
Admission: EM | Admit: 2023-11-23 | Discharge: 2023-11-24 | Disposition: A | Discharge status: Home / Self Care | Attending: Emergency Medicine | Admitting: Emergency Medicine

## 2023-11-23 ENCOUNTER — Other Ambulatory Visit: Payer: Self-pay

## 2023-11-23 DIAGNOSIS — I499 Cardiac arrhythmia, unspecified: Secondary | ICD-10-CM | POA: Diagnosis not present

## 2023-11-23 DIAGNOSIS — I10 Essential (primary) hypertension: Secondary | ICD-10-CM | POA: Insufficient documentation

## 2023-11-23 DIAGNOSIS — W01198A Fall on same level from slipping, tripping and stumbling with subsequent striking against other object, initial encounter: Secondary | ICD-10-CM | POA: Insufficient documentation

## 2023-11-23 DIAGNOSIS — S0083XA Contusion of other part of head, initial encounter: Secondary | ICD-10-CM | POA: Insufficient documentation

## 2023-11-23 DIAGNOSIS — W19XXXA Unspecified fall, initial encounter: Secondary | ICD-10-CM

## 2023-11-23 DIAGNOSIS — Z743 Need for continuous supervision: Secondary | ICD-10-CM | POA: Diagnosis not present

## 2023-11-23 DIAGNOSIS — R030 Elevated blood-pressure reading, without diagnosis of hypertension: Secondary | ICD-10-CM | POA: Diagnosis not present

## 2023-11-23 DIAGNOSIS — Y92009 Unspecified place in unspecified non-institutional (private) residence as the place of occurrence of the external cause: Secondary | ICD-10-CM | POA: Insufficient documentation

## 2023-11-23 DIAGNOSIS — S0990XA Unspecified injury of head, initial encounter: Secondary | ICD-10-CM

## 2023-11-23 NOTE — ED Triage Notes (Signed)
 Pt to ed from home via [facility] for a mechanical fall. Pt tripped over some shoes and fell and struck the left side of her head. Denies LOC. Denies blood thinners. Requested transport. No pertinent medical HX. Per EMS she had some runs of trigeminy so they obtained an ECG which shows 1st degree block.  Vitals: 211/112 BGL 113 85 HR  98% RA 18 G LAC Pt is caox4, in no acute distress and able to stand and walk from EMS bed to ED bed,.

## 2023-11-24 ENCOUNTER — Emergency Department

## 2023-11-24 DIAGNOSIS — S0990XA Unspecified injury of head, initial encounter: Secondary | ICD-10-CM | POA: Diagnosis not present

## 2023-11-24 NOTE — ED Provider Notes (Signed)
 [facility] Provider Note    Event Date/Time   First MD Initiated Contact with Patient 11/24/23 0000     (approximate)   History   Fall   HPI  Tammy Hendricks is a 88 y.o. female with a history of hypertension who presents after a mechanical fall.  Patient reports she tripped over an object at home, fell and hit her head.  She has no neck pain, no back pain, no chest pain, no abdominal pain no extremity injuries.     Physical Exam   Triage Vital Signs: ED Triage Vitals  Encounter Vitals Group     BP 11/23/23 2357 (!) 197/98     Girls Systolic BP Percentile --      Girls Diastolic BP Percentile --      Boys Systolic BP Percentile --      Boys Diastolic BP Percentile --      Pulse Rate 11/23/23 2357 90     Resp 11/23/23 2357 16     Temp 11/23/23 2357 98 F (36.7 C)     Temp Source 11/23/23 2357 Oral     SpO2 11/23/23 2357 100 %     Weight --      Height 11/23/23 2358 1.575 m (5' 2)     Head Circumference --      Peak Flow --      Pain Score 11/23/23 2358 0     Pain Loc --      Pain Education --      Exclude from Growth Chart --     Most recent vital signs: Vitals:   11/24/23 0030 11/24/23 0100  BP: (!) 184/86 (!) 185/73  Pulse: 84 78  Resp:  18  Temp:    SpO2: 99% 99%     General: Awake, no distress.  CV:  Good peripheral perfusion.  No chest wall tenderness palpation Resp:  Normal effort.  Abd:  No distention.  Soft, nontender Other:  Hematoma noted just above the ear on the left, no bleeding Normal neurologic exam, cranial nerves II through XII are normal.  Normal strength in all extremities, no tenderness to palpation of the extremities, no vertebral tenderness palpation   ED Results / Procedures / Treatments   Labs (all labs ordered are listed, but only abnormal results are displayed) Labs Reviewed - No data to display   EKG  ED ECG REPORT I, Lamar Price, the attending physician, personally viewed and  interpreted this ECG.  Date: 11/24/2023  Rhythm: normal sinus rhythm QRS Axis: normal Intervals: normal ST/T Wave abnormalities: normal Narrative Interpretation: no evidence of acute ischemia    RADIOLOGY CT head without acute abnormality    PROCEDURES:  Critical Care performed:   Procedures   MEDICATIONS ORDERED IN ED: Medications - No data to display   IMPRESSION / MDM / ASSESSMENT AND PLAN / ED COURSE  I reviewed the triage vital signs and the nursing notes. Patient's presentation is most consistent with acute presentation with potential threat to life or bodily function.  Patient presents with head injury as detailed above.  She has a hematoma over the left ear.  Differential includes minor head injury, skull fracture, ICH, will send for CT head.  She is not on blood thinners  Noted to be hypertensive here, she does have a history of hypertension, will monitor  ----------------------------------------- 1:35 AM on 11/24/2023 ----------------------------------------- CT scan is reassuring, patient feels well and has no complaints.  Blood pressure is still  elevated, I have asked her to follow-up closely with her PCP for recheck      FINAL CLINICAL IMPRESSION(S) / ED DIAGNOSES   Final diagnoses:  Fall, initial encounter  Injury of head, initial encounter  Elevated blood pressure reading     Rx / DC Orders   ED Discharge Orders     None        Note:  This document was prepared using Dragon voice recognition software and may include unintentional dictation errors.   Arlander [place], MD 11/24/23 [phone]

## 2023-11-24 NOTE — ED Notes (Signed)
 Basics sent down.

## 2023-11-24 NOTE — ED Notes (Signed)
 RN to bedside to assist pt to toilet and back to bed.

## 2023-11-25 ENCOUNTER — Observation Stay
Admission: EM | Admit: 2023-11-25 | Discharge: 2023-11-29 | Disposition: A | Discharge status: Home / Self Care | Attending: Osteopathic Medicine | Admitting: Osteopathic Medicine

## 2023-11-25 ENCOUNTER — Encounter: Payer: Self-pay | Admitting: Emergency Medicine

## 2023-11-25 ENCOUNTER — Emergency Department

## 2023-11-25 ENCOUNTER — Other Ambulatory Visit: Payer: Self-pay

## 2023-11-25 DIAGNOSIS — E66811 Obesity, class 1: Secondary | ICD-10-CM | POA: Insufficient documentation

## 2023-11-25 DIAGNOSIS — M5000 Cervical disc disorder with myelopathy, unspecified cervical region: Secondary | ICD-10-CM | POA: Insufficient documentation

## 2023-11-25 DIAGNOSIS — Z7901 Long term (current) use of anticoagulants: Secondary | ICD-10-CM | POA: Diagnosis not present

## 2023-11-25 DIAGNOSIS — R29898 Other symptoms and signs involving the musculoskeletal system: Secondary | ICD-10-CM | POA: Diagnosis not present

## 2023-11-25 DIAGNOSIS — H8113 Benign paroxysmal vertigo, bilateral: Secondary | ICD-10-CM | POA: Diagnosis not present

## 2023-11-25 DIAGNOSIS — Z79899 Other long term (current) drug therapy: Secondary | ICD-10-CM | POA: Insufficient documentation

## 2023-11-25 DIAGNOSIS — I680 Cerebral amyloid angiopathy: Secondary | ICD-10-CM

## 2023-11-25 DIAGNOSIS — N390 Urinary tract infection, site not specified: Secondary | ICD-10-CM | POA: Insufficient documentation

## 2023-11-25 DIAGNOSIS — Q211 Atrial septal defect, unspecified: Secondary | ICD-10-CM

## 2023-11-25 DIAGNOSIS — I10 Essential (primary) hypertension: Secondary | ICD-10-CM | POA: Insufficient documentation

## 2023-11-25 DIAGNOSIS — M4802 Spinal stenosis, cervical region: Secondary | ICD-10-CM | POA: Diagnosis not present

## 2023-11-25 DIAGNOSIS — R531 Weakness: Principal | ICD-10-CM | POA: Insufficient documentation

## 2023-11-25 DIAGNOSIS — Z7982 Long term (current) use of aspirin: Secondary | ICD-10-CM | POA: Diagnosis not present

## 2023-11-25 DIAGNOSIS — Z6831 Body mass index (BMI) 31.0-31.9, adult: Secondary | ICD-10-CM | POA: Insufficient documentation

## 2023-11-25 DIAGNOSIS — R4781 Slurred speech: Secondary | ICD-10-CM | POA: Diagnosis present

## 2023-11-25 DIAGNOSIS — R29818 Other symptoms and signs involving the nervous system: Principal | ICD-10-CM | POA: Insufficient documentation

## 2023-11-25 DIAGNOSIS — G9589 Other specified diseases of spinal cord: Secondary | ICD-10-CM | POA: Diagnosis not present

## 2023-11-25 DIAGNOSIS — Z9181 History of falling: Secondary | ICD-10-CM | POA: Insufficient documentation

## 2023-11-25 DIAGNOSIS — G959 Disease of spinal cord, unspecified: Secondary | ICD-10-CM

## 2023-11-25 LAB — COMPREHENSIVE METABOLIC PANEL WITH GFR
ALT: 18 U/L (ref 0–44)
AST: 22 U/L (ref 15–41)
Albumin: 3.4 g/dL — ABNORMAL LOW (ref 3.5–5.0)
Alkaline Phosphatase: 69 U/L (ref 38–126)
Anion gap: 12 (ref 5–15)
BUN: 29 mg/dL — ABNORMAL HIGH (ref 8–23)
CO2: 26 mmol/L (ref 22–32)
Calcium: 9 mg/dL (ref 8.9–10.3)
Chloride: 101 mmol/L (ref 98–111)
Creatinine, Ser: 1.06 mg/dL — ABNORMAL HIGH (ref 0.44–1.00)
GFR, Estimated: 51 mL/min — ABNORMAL LOW (ref >60)
Glucose, Bld: 75 mg/dL (ref 70–99)
Potassium: 3.3 mmol/L — ABNORMAL LOW (ref 3.5–5.1)
Sodium: 139 mmol/L (ref 135–145)
Total Bilirubin: 0.7 mg/dL (ref 0.0–1.2)
Total Protein: 7.2 g/dL (ref 6.5–8.1)

## 2023-11-25 LAB — CBC WITH DIFFERENTIAL/PLATELET
Abs Immature Granulocytes: 0.01 K/uL (ref 0.00–0.07)
Basophils Absolute: 0 K/uL (ref 0.0–0.1)
Basophils Relative: 0 %
Eosinophils Absolute: 0.1 K/uL (ref 0.0–0.5)
Eosinophils Relative: 2 %
HCT: 32.5 % — ABNORMAL LOW (ref 36.0–46.0)
Hemoglobin: 10.4 g/dL — ABNORMAL LOW (ref 12.0–15.0)
Immature Granulocytes: 0 %
Lymphocytes Relative: 37 %
Lymphs Abs: 1.9 K/uL (ref 0.7–4.0)
MCH: 24.3 pg — ABNORMAL LOW (ref 26.0–34.0)
MCHC: 32 g/dL (ref 30.0–36.0)
MCV: 75.9 fL — ABNORMAL LOW (ref 80.0–100.0)
Monocytes Absolute: 0.6 K/uL (ref 0.1–1.0)
Monocytes Relative: 13 %
Neutro Abs: 2.3 K/uL (ref 1.7–7.7)
Neutrophils Relative %: 48 %
Platelets: 205 K/uL (ref 150–400)
RBC: 4.28 MIL/uL (ref 3.87–5.11)
RDW: 14.6 % (ref 11.5–15.5)
WBC: 5 K/uL (ref 4.0–10.5)
nRBC: 0 % (ref 0.0–0.2)

## 2023-11-25 LAB — PROTIME-INR
INR: 1.1 (ref 0.8–1.2)
Prothrombin Time: 14.9 s (ref 11.4–15.2)

## 2023-11-25 LAB — ETHANOL: Alcohol, Ethyl (B): <15 mg/dL (ref <15)

## 2023-11-25 LAB — APTT: aPTT: 27 s (ref 24–36)

## 2023-11-25 MED ORDER — SODIUM CHLORIDE 0.9% FLUSH
3.0000 mL | Freq: Once | INTRAVENOUS | Status: AC
  Administered 2023-11-26: 3 mL via INTRAVENOUS

## 2023-11-25 NOTE — ED Triage Notes (Signed)
 Patient to ED via POV for left leg weakness and slow speech per daughter, BARI 0030 when patient went to sleep. No slurred speech noted or aphasia. Pt able to hold up left leg for 10 sec without dropping but states it feels weak in comparison to right.  Daughter reports fall on 8/2 and was seen in ED for same.

## 2023-11-25 NOTE — ED Provider Notes (Incomplete)
 [facility] Provider Note    Event Date/Time   First MD Initiated Contact with Patient 11/25/23 2258     (approximate)   History   Stroke Symptoms   HPI  Tammy Hendricks is a 88 y.o. female with history of hypertension who presents with vague symptoms of weakness and feeling different since around 5 PM Sunday, now more than 24 hours ago.  She states that she has been feeling dizzy, describing this as lightheaded.  She reports feeling weak in both legs.  She denies any focal weakness to one leg or the other.  She reports decreased vision bilaterally as well as difficulty speaking.  Reviewed the past medical records.  The patient was actually seen in the ED yesterday after a mechanical fall.  She had a CT head at that time which was negative.  Previously her most recent outpatient encounter was with internal medicine on 7/18 for follow-up of chronic episodes of dizziness.   Physical Exam   Triage Vital Signs: ED Triage Vitals  Encounter Vitals Group     BP 11/25/23 1812 (!) 166/87     Girls Systolic BP Percentile --      Girls Diastolic BP Percentile --      Boys Systolic BP Percentile --      Boys Diastolic BP Percentile --      Pulse Rate 11/25/23 1812 64     Resp 11/25/23 1812 18     Temp 11/25/23 1812 (!) 97.5 F (36.4 C)     Temp Source 11/25/23 1812 Oral     SpO2 11/25/23 1812 100 %     Weight 11/25/23 1813 174 lb 2.6 oz (79 kg)     Height 11/25/23 1813 5' 2 (1.575 m)     Head Circumference --      Peak Flow --      Pain Score 11/25/23 1813 0     Pain Loc --      Pain Education --      Exclude from Growth Chart --     Most recent vital signs: Vitals:   11/26/23 0521 11/26/23 0629  BP:  (!) 176/90  Pulse:  91  Resp:  16  Temp: 98.3 F (36.8 C) 98 F (36.7 C)  SpO2:  100%     General: Alert and oriented, no distress.  CV:  Good peripheral perfusion.  Resp:  Normal effort.  Abd:  No distention.  Other:  EOMI.  PERRLA.  No  photophobia.  No facial droop.  Motor intact in all extremities.  No ataxia on finger-to-nose.  No pronator drift.  No obvious aphasia or dysarthria but patient does appear somewhat hesitant speaking, having difficulty answering certain questions.   ED Results / Procedures / Treatments   Labs (all labs ordered are listed, but only abnormal results are displayed) Labs Reviewed  COMPREHENSIVE METABOLIC PANEL WITH GFR - Abnormal; Notable for the following components:      Result Value   Potassium 3.3 (*)    BUN 29 (*)    Creatinine, Ser 1.06 (*)    Albumin 3.4 (*)    GFR, Estimated 51 (*)    All other components within normal limits  CBC WITH DIFFERENTIAL/PLATELET - Abnormal; Notable for the following components:   Hemoglobin 10.4 (*)    HCT 32.5 (*)    MCV 75.9 (*)    MCH 24.3 (*)    All other components within normal limits  URINALYSIS, ROUTINE W REFLEX  MICROSCOPIC - Abnormal; Notable for the following components:   Color, Urine YELLOW (*)    APPearance HAZY (*)    Leukocytes,Ua SMALL (*)    Bacteria, UA MANY (*)    All other components within normal limits  PROTIME-INR  APTT  ETHANOL  TSH  LIPID PANEL  CBG MONITORING, ED  TROPONIN I (HIGH SENSITIVITY)  TROPONIN I (HIGH SENSITIVITY)     EKG  ED ECG REPORT I, Waylon Cassis, the attending physician, personally viewed and interpreted this ECG.  Date: 11/25/2023 EKG Time: 1818 Rate: 59 Rhythm: normal sinus rhythm QRS Axis: normal Intervals: normal ST/T Wave abnormalities: normal Narrative Interpretation: no evidence of acute ischemia    RADIOLOGY  CT head: I dependently viewed and interpreted the images; there is no ICH.  Radiology report indicates no acute abnormality.  MR brain:  IMPRESSION:  1. No acute intracranial abnormality.  2. Numerous chronic microhemorrhages in a peripheral distribution, most typical  of cerebral amyloid angiopathy.  3. Multifocal hyperintense T2-weighted signal within the  cerebral white matter,  most commonly due to chronic small vessel disease.    PROCEDURES:  Critical Care performed: No  Procedures   MEDICATIONS ORDERED IN ED: Medications   stroke: early stages of recovery book (has no administration in time range)  0.9 %  sodium chloride  infusion ( Intravenous Infusion Verify 11/26/23 0644)  acetaminophen  (TYLENOL ) tablet 650 mg (has no administration in time range)    Or  acetaminophen  (TYLENOL ) 160 MG/5ML solution 650 mg (has no administration in time range)    Or  acetaminophen  (TYLENOL ) suppository 650 mg (has no administration in time range)  enoxaparin  (LOVENOX ) injection 40 mg (has no administration in time range)  sodium chloride  flush (NS) 0.9 % injection 3 mL (3 mLs Intravenous Given 11/26/23 0215)     IMPRESSION / MDM / ASSESSMENT AND PLAN / ED COURSE  I reviewed the triage vital signs and the nursing notes.  88 year old female with PMH as noted above presents with nonspecific symptoms including generalized weakness, difficulty speaking, and decreased or blurred vision since yesterday afternoon.  She reported focal left leg weakness during triage, but I did not find this on my exam.  There are no focal neurologic deficits on exam currently.  Differential diagnosis includes, but is not limited to, CVA, TIA, dehydration, electrolyte abnormality, other metabolic cause, hypertensive crisis, less likely cardiac etiology.  CT head is negative.  CMP and CBC show no acute findings.  I have added on an MRI of the brain, cardiac enzymes, TSH, urinalysis.  Patient's presentation is most consistent with acute presentation with potential threat to life or bodily function.  The patient is on the cardiac monitor to evaluate for evidence of arrhythmia and/or significant heart rate changes.   ----------------------------------------- 4:15 AM on 11/26/2023 -----------------------------------------  MRI shows no evidence of acute stroke but some  findings of possible  cerebral amyloid angiopathy.  Urinalysis is negative for findings of UTI.  I recommended that we admit the patient for further monitoring and stroke workup and the patient and her family member were in agreement.  I consulted Dr. Cleatus from the hospitalist service; based on our discussion she agreed to evaluate the patient for admission.  FINAL CLINICAL IMPRESSION(S) / ED DIAGNOSES   Final diagnoses:  Weakness     Rx / DC Orders   ED Discharge Orders     None        Note:  This document was prepared using Dragon voice recognition software  and may include unintentional dictation errors.    Jacolyn Pae, MD 11/26/23 9297    Jacolyn Pae, MD 11/26/23 [phone]

## 2023-11-26 ENCOUNTER — Observation Stay (HOSPITAL_BASED_OUTPATIENT_CLINIC_OR_DEPARTMENT_OTHER)
Admit: 2023-11-26 | Discharge: 2023-11-26 | Disposition: A | Discharge status: Home / Self Care | Attending: Internal Medicine

## 2023-11-26 ENCOUNTER — Emergency Department

## 2023-11-26 DIAGNOSIS — R29818 Other symptoms and signs involving the nervous system: Secondary | ICD-10-CM | POA: Diagnosis present

## 2023-11-26 DIAGNOSIS — E859 Amyloidosis, unspecified: Secondary | ICD-10-CM | POA: Diagnosis not present

## 2023-11-26 DIAGNOSIS — I160 Hypertensive urgency: Secondary | ICD-10-CM

## 2023-11-26 DIAGNOSIS — R4182 Altered mental status, unspecified: Secondary | ICD-10-CM | POA: Diagnosis not present

## 2023-11-26 DIAGNOSIS — H81399 Other peripheral vertigo, unspecified ear: Secondary | ICD-10-CM

## 2023-11-26 DIAGNOSIS — I680 Cerebral amyloid angiopathy: Secondary | ICD-10-CM

## 2023-11-26 DIAGNOSIS — G459 Transient cerebral ischemic attack, unspecified: Secondary | ICD-10-CM | POA: Diagnosis not present

## 2023-11-26 DIAGNOSIS — I959 Hypotension, unspecified: Secondary | ICD-10-CM | POA: Diagnosis not present

## 2023-11-26 DIAGNOSIS — W19XXXA Unspecified fall, initial encounter: Secondary | ICD-10-CM

## 2023-11-26 DIAGNOSIS — G309 Alzheimer's disease, unspecified: Secondary | ICD-10-CM | POA: Diagnosis not present

## 2023-11-26 DIAGNOSIS — R9082 White matter disease, unspecified: Secondary | ICD-10-CM | POA: Diagnosis not present

## 2023-11-26 DIAGNOSIS — E860 Dehydration: Secondary | ICD-10-CM | POA: Diagnosis not present

## 2023-11-26 LAB — LIPID PANEL
Cholesterol: 135 mg/dL (ref 0–200)
HDL: 42 mg/dL (ref >40)
LDL Cholesterol: 82 mg/dL (ref 0–99)
Total CHOL/HDL Ratio: 3.2 ratio
Triglycerides: 57 mg/dL (ref <150)
VLDL: 11 mg/dL (ref 0–40)

## 2023-11-26 LAB — ECHOCARDIOGRAM COMPLETE
AR max vel: 2.33 cm2
AV Area VTI: 2.45 cm2
AV Area mean vel: 2.18 cm2
AV Mean grad: 3.4 mmHg
AV Peak grad: 6.6 mmHg
Ao pk vel: 1.28 m/s
Area-P 1/2: 4.29 cm2
Height: 62 in
S' Lateral: 3 cm
Weight: 2720 [oz_av]

## 2023-11-26 LAB — TSH: TSH: 0.764 u[IU]/mL (ref 0.350–4.500)

## 2023-11-26 LAB — URINALYSIS, ROUTINE W REFLEX MICROSCOPIC
Bilirubin Urine: NEGATIVE
Glucose, UA: NEGATIVE mg/dL
Hgb urine dipstick: NEGATIVE
Ketones, ur: NEGATIVE mg/dL
Nitrite: NEGATIVE
Protein, ur: NEGATIVE mg/dL
Specific Gravity, Urine: 1.016 (ref 1.005–1.030)
pH: 7 (ref 5.0–8.0)

## 2023-11-26 LAB — TROPONIN I (HIGH SENSITIVITY)
Troponin I (High Sensitivity): 4 ng/L (ref <18)
Troponin I (High Sensitivity): 4 ng/L (ref <18)

## 2023-11-26 MED ORDER — ENOXAPARIN SODIUM 40 MG/0.4ML IJ SOSY
40.0000 mg | PREFILLED_SYRINGE | INTRAMUSCULAR | Status: DC
  Administered 2023-11-26 – 2023-11-29 (×4): 40 mg via SUBCUTANEOUS
  Filled 2023-11-26 (×4): qty 0.4

## 2023-11-26 MED ORDER — SODIUM CHLORIDE 0.9 % IV SOLN: INTRAVENOUS | Status: AC

## 2023-11-26 MED ORDER — ACETAMINOPHEN 160 MG/5ML PO SOLN: 650.0000 mg | ORAL | Status: DC | PRN

## 2023-11-26 MED ORDER — ACETAMINOPHEN 650 MG RE SUPP: 650.0000 mg | RECTAL | Status: DC | PRN

## 2023-11-26 MED ORDER — ACETAMINOPHEN ER 650 MG PO TBCR: 650.0000 mg | EXTENDED_RELEASE_TABLET | Freq: Three times a day (TID) | ORAL | Status: DC | PRN

## 2023-11-26 MED ORDER — POTASSIUM CHLORIDE CRYS ER 20 MEQ PO TBCR
20.0000 meq | EXTENDED_RELEASE_TABLET | Freq: Every day | ORAL | Status: DC
  Administered 2023-11-26 – 2023-11-29 (×4): 20 meq via ORAL
  Filled 2023-11-26 (×4): qty 1

## 2023-11-26 MED ORDER — SODIUM CHLORIDE 0.9 % IV SOLN
1.0000 g | INTRAVENOUS | Status: DC
  Administered 2023-11-26 – 2023-11-27 (×2): 1 g via INTRAVENOUS
  Filled 2023-11-26 (×3): qty 10

## 2023-11-26 MED ORDER — ASPIRIN 81 MG PO TBEC
81.0000 mg | DELAYED_RELEASE_TABLET | Freq: Every day | ORAL | Status: DC
  Administered 2023-11-26 – 2023-11-29 (×4): 81 mg via ORAL
  Filled 2023-11-26 (×4): qty 1

## 2023-11-26 MED ORDER — SENNOSIDES-DOCUSATE SODIUM 8.6-50 MG PO TABS
1.0000 | ORAL_TABLET | Freq: Every day | ORAL | Status: DC
  Administered 2023-11-29: 1 via ORAL
  Filled 2023-11-26: qty 1

## 2023-11-26 MED ORDER — ACETAMINOPHEN 325 MG PO TABS: 650.0000 mg | ORAL_TABLET | ORAL | Status: DC | PRN

## 2023-11-26 MED ORDER — HYDROCHLOROTHIAZIDE 25 MG PO TABS
25.0000 mg | ORAL_TABLET | Freq: Every day | ORAL | Status: DC
  Administered 2023-11-26 – 2023-11-29 (×4): 25 mg via ORAL
  Filled 2023-11-26 (×4): qty 1

## 2023-11-26 MED ORDER — PANTOPRAZOLE SODIUM 40 MG PO TBEC
40.0000 mg | DELAYED_RELEASE_TABLET | Freq: Every day | ORAL | Status: DC
  Administered 2023-11-26 – 2023-11-29 (×4): 40 mg via ORAL
  Filled 2023-11-26 (×4): qty 1

## 2023-11-26 MED ORDER — STROKE: EARLY STAGES OF RECOVERY BOOK: Freq: Once | Status: AC

## 2023-11-26 NOTE — ED Notes (Signed)
Readjusted pt in bed 

## 2023-11-26 NOTE — TOC Initial Note (Signed)
 Transition of Care [facility]) - Initial/Assessment Note    Patient Details  Name: Tammy Hendricks MRN: [id number] Date of Birth: Jul 15, 1935  Transition of Care [facility]) CM/SW Contact:    Dalia GORMAN Fuse, RN Phone Number: 11/26/2023, 11:56 AM  Clinical Narrative:                 Patient is from home and admitted as observation for stroke w/u. Therapy recommendations are pending at this time. TOC will continue to outreach.         Patient Goals and CMS Choice            Expected Discharge Plan and Services                                              Prior Living Arrangements/Services                       Activities of Daily Living   ADL Screening (condition at time of admission) Independently performs ADLs?: Yes (appropriate for developmental age) Is the patient deaf or have difficulty hearing?: No Does the patient have difficulty seeing, even when wearing glasses/contacts?: No Does the patient have difficulty concentrating, remembering, or making decisions?: No  Permission Sought/Granted                  Emotional Assessment              Admission diagnosis:  Subacute neurologic deficit [R29.818] Patient Active Problem List   Diagnosis Date Noted   Subacute neurologic deficit 11/26/2023   Acute URI 09/13/2023   Gastroesophageal reflux disease without esophagitis 09/06/2023   Prediabetes 04/02/2023   Absolute anemia 04/02/2023   Impaired glucose tolerance 06/28/2022   Mixed hyperlipidemia 06/28/2022   Essential hypertension, benign 06/28/2022   Benign paroxysmal positional vertigo due to bilateral vestibular disorder 06/28/2022   Primary open angle glaucoma (POAG) of both eyes 06/28/2022   PCP:  Fernand Fredy GORMAN, MD Pharmacy:   [facility] [phone] [place], [place] - [address] [place] [address] Phone: [phone] Fax: [phone]  [facility] - [place], [place] - [address]  [address] [place] [address] Phone: [phone] Fax: [phone]     Social Drivers of Health (SDOH) Social History: SDOH Screenings   Food Insecurity: No Food Insecurity (11/26/2023)  Housing: Low Risk  (11/26/2023)  Transportation Needs: No Transportation Needs (11/26/2023)  Utilities: Not At Risk (11/26/2023)  Alcohol Screen: Low Risk  (04/02/2023)  Depression (PHQ2-9): Low Risk  (04/02/2023)  Financial Resource Strain: Low Risk  (04/02/2023)  Social Connections: Unknown (11/26/2023)  Tobacco Use: Low Risk  (11/25/2023)   SDOH Interventions:     Readmission Risk Interventions     No data to display

## 2023-11-26 NOTE — ED Notes (Signed)
 While I assisted pt to bathroom I put fall bundle stuff in place

## 2023-11-26 NOTE — Progress Notes (Signed)
 MD notified of pt concern of leaving the hospital.

## 2023-11-26 NOTE — ED Notes (Signed)
 Pt placed on CCMD monitoring

## 2023-11-26 NOTE — Consult Note (Signed)
 NEUROLOGY CONSULT NOTE   Date of service: November 26, 2023 Patient Name: Tammy Hendricks MRN:  [id number] DOB:  [birth year] Chief Complaint: I fell and could not get up Requesting Provider: Collie Daved BROCKS, DO  History of Present Illness  Tammy Hendricks is a 88 y.o. female with PMHx of arthritis, HTN, prediabetes, left sided sciatica and sporadic vertigo who initially presented to the ED from home on 8/2 after a mechanical fall during which she hit the left side of her head without LOC. BP was elevated at 211/112 on arrival, with HR of 85, normal O2 sats and CBG of 113. She had a CT head at that time which was negative. She was discharged and then came back yesterday evening with new complaint of LLE weakness and slurred speech. LKN was 0030 on Monday when she went to sleep. On arrival to the ED no slurred speech or aphasia was noted. She was able to elevate her LLE without dropping, but still had feeling of weakness in that limb. She also endorsed some degree of weakness in the contralateral limb, as well as decreased vision bilaterally and difficulty speaking. In the emergency department, she was hypertensive up to 203/80. MRI was without acute intracranial abnormality; however, numerous chronic microhemorrhages and chronic small vessel disease was noted.   Of note, she was seen on 7/18 by PCP for evaluation of dizziness and RIGHT leg weakness which had resolved.    ROS   As per HPI. Unable to perform a detailed ROS due to patient being a poor historian with inconsistent memories of the event prior to presentation.   Past History   Past Medical History:  Diagnosis Date   Arthritis    right knee   Hypertension    Pre-diabetes    Sciatica    left side - occasional   Vertigo    sporadic   Wears dentures    partial upper, full lower    Past Surgical History:  Procedure Laterality Date   ABDOMINAL HYSTERECTOMY     APPENDECTOMY     CATARACT EXTRACTION W/PHACO Right 11/09/2015    Procedure: CATARACT EXTRACTION PHACO AND INTRAOCULAR LENS PLACEMENT (IOC) right eye;  Surgeon: Dene Etienne, MD;  Location: [facility];  Service: Ophthalmology;  Laterality: Right;   CATARACT EXTRACTION W/PHACO Left 12/03/2017   Procedure: CATARACT EXTRACTION PHACO AND INTRAOCULAR LENS PLACEMENT (IOC) LEFT EYE;  Surgeon: Etienne Dene, MD;  Location: [facility];  Service: Ophthalmology;  Laterality: Left;   CHOLECYSTECTOMY     COLONOSCOPY     OOPHORECTOMY     TONSILLECTOMY      Family History: Family History  Problem Relation Age of Onset   Dementia Mother    Hypertension Father     Social History  reports that she has never smoked. She has never used smokeless tobacco. She reports that she does not drink alcohol. No history on file for drug use.  No Known Allergies  Medications   Current Facility-Administered Medications:    [START ON 11/27/2023]  stroke: early stages of recovery book, , Does not apply, Once, Cleatus Delayne GAILS, MD   0.9 %  sodium chloride  infusion, , Intravenous, Continuous, Cleatus Delayne GAILS, MD, Last Rate: 40 mL/hr at 11/26/23 0644, Infusion Verify at 11/26/23 0644   acetaminophen  (TYLENOL ) tablet 650 mg, 650 mg, Oral, Q4H PRN **OR** acetaminophen  (TYLENOL ) 160 MG/5ML solution 650 mg, 650 mg, Per Tube, Q4H PRN **OR** acetaminophen  (TYLENOL ) suppository 650 mg, 650 mg, Rectal, Q4H PRN, Cleatus,  Delayne GAILS, MD   aspirin  EC tablet 81 mg, 81 mg, Oral, Daily, Krugh, Marissa C, DO, 81 mg at 11/26/23 1137   cefTRIAXone  (ROCEPHIN ) 1 g in sodium chloride  0.9 % 100 mL IVPB, 1 g, Intravenous, Q24H, Krugh, Marissa C, DO, Last Rate: 200 mL/hr at 11/26/23 1130, 1 g at 11/26/23 1130   enoxaparin  (LOVENOX ) injection 40 mg, 40 mg, Subcutaneous, Q24H, Cleatus Delayne GAILS, MD, 40 mg at 11/26/23 1135   hydrochlorothiazide  (HYDRODIURIL ) tablet 25 mg, 25 mg, Oral, Daily, Krugh, Marissa C, DO, 25 mg at 11/26/23 1137   pantoprazole  (PROTONIX ) EC tablet 40 mg, 40 mg, Oral,  Daily, Krugh, Marissa C, DO, 40 mg at 11/26/23 1137   potassium chloride  SA (KLOR-CON  M) CR tablet 20 mEq, 20 mEq, Oral, Daily, Krugh, Marissa C, DO, 20 mEq at 11/26/23 1137   senna-docusate (Senokot-S) tablet 1 tablet, 1 tablet, Oral, Daily, Krugh, Marissa C, DO  No current facility-administered medications on file prior to encounter.   Current Outpatient Medications on File Prior to Encounter  Medication Sig Dispense Refill   acetaminophen  (TYLENOL ) 650 MG CR tablet Take 650 mg by mouth every 8 (eight) hours as needed for pain.     aspirin  EC 81 MG tablet Take 81 mg by mouth daily. Swallow whole.     benzonatate  (TESSALON  PERLES) 100 MG capsule Take 1 capsule (100 mg total) by mouth 3 (three) times daily as needed for cough. (Patient not taking: Reported on 11/08/2023) 30 capsule 1   brimonidine  (ALPHAGAN ) 0.2 % ophthalmic solution Place 1 drop into both eyes 2 (two) times daily.     fluticasone  (FLONASE ) 50 MCG/ACT nasal spray Place 1 spray into both nostrils daily. 15.8 mL 2   hydrochlorothiazide  (HYDRODIURIL ) 25 MG tablet TAKE 1 TABLET BY MOUTH ONCE  DAILY 100 tablet 2   Krill Oil 500 MG CAPS Take 1 capsule by mouth daily.     latanoprost (XALATAN) 0.005 % ophthalmic solution Place 1 drop into both eyes at bedtime.     loratadine (CLARITIN) 10 MG tablet Take 10 mg by mouth daily.     meloxicam  (MOBIC ) 15 MG tablet Take 1 tablet (15 mg total) by mouth daily. 30 tablet 3   pantoprazole  (PROTONIX ) 40 MG tablet Take 1 tablet (40 mg total) by mouth daily. 90 tablet 2   potassium chloride  SA (KLOR-CON  M) 20 MEQ tablet Take 1 tablet (20 mEq total) by mouth daily. 100 tablet 2   senna-docusate (SENOKOT-S) 8.6-50 MG tablet Take 1 tablet by mouth daily.     timolol  (TIMOPTIC ) 0.5 % ophthalmic solution      [DISCONTINUED] lisinopril -hydrochlorothiazide  (ZESTORETIC) 20-25 MG tablet Take 1 tablet by mouth daily.        Vitals   Vitals:   11/26/23 0521 11/26/23 0629 11/26/23 0927 11/26/23 1139   BP:  (!) 176/90 (!) 203/80 (!) 194/88  Pulse:  91 95 95  Resp:  16  18  Temp: 98.3 F (36.8 C) 98 F (36.7 C) 98.1 F (36.7 C) 98 F (36.7 C)  TempSrc: Oral  Oral Oral  SpO2:  100% 98% 98%  Weight:  77.1 kg    Height:        Body mass index is 31.09 kg/m.   Physical Exam   Constitutional: Appears well-developed and well-nourished.  Psych: Affect appropriate to situation.  Eyes: No scleral injection.  HENT: No OP obstruction.  Head: Normocephalic.  Respiratory: Effort normal, non-labored breathing.    Neurologic Examination   Mental Status:  Awake and alert. Oriented to the city, state, month and year, but not the day of the week. Has some confrontational naming deficits to common items. Inconsistent memories of the event at home with lack of detail. Speech is fluent with intact comprehension for all basic commands, but does require some clarification/repetition for some commands. No dysarthria. Cranial Nerves: II: Temporal visual fields intact. PERRL. III,IV, VI: No ptosis. EOMI. No nystagmus. V: Temp sensation equal bilaterally VII: Smile symmetric VIII: Hearing intact to voice IX,X: No hypophonia or hoarseness XI: Symmetric XII: Midline tongue extension Motor: RUE: 5/5 LUE: 5/5 RLE: 5/5 LLE: 5/5 Sensory: FT intact x 4. Has difficulty comprehending commands when attempting to test for extinction.  Deep Tendon Reflexes: 2+ and symmetric bilateral biceps and brachioradialis. Trace right patellar. Left patellar deferred due to presence of bruising at knee.  Cerebellar: No ataxia with FNF on the right. Mild dysmetria on the left. Moderate action tremor bilaterally.  Gait: Deferred   Labs/Imaging/Neurodiagnostic studies   CBC:  Recent Labs  Lab 2023-11-28 1817  WBC 5.0  NEUTROABS 2.3  HGB 10.4*  HCT 32.5*  MCV 75.9*  PLT 205   Basic Metabolic Panel:  Lab Results  Component Value Date   NA 139 11/28/23   K 3.3 (L) 11/28/23   CO2 26 11-28-23    GLUCOSE 75 11/28/23   BUN 29 (H) 11-28-2023   CREATININE 1.06 (H) 11/28/23   CALCIUM 9.0 2023/11/28   GFRNONAA 51 (L) November 28, 2023   Lipid Panel:  Lab Results  Component Value Date   LDLCALC 82 11/26/2023   HgbA1c:  Lab Results  Component Value Date   HGBA1C 6.4 (H) 07/05/2023   Urine Drug Screen: No results found for: LABOPIA, COCAINSCRNUR, LABBENZ, AMPHETMU, THCU, LABBARB  Alcohol Level     Component Value Date/Time   [facility] <15 28-Nov-2023 1817   INR  Lab Results  Component Value Date   INR 1.1 2023/11/28   APTT  Lab Results  Component Value Date   APTT 27 2023/11/28   TTE: 1. Left ventricular ejection fraction, by estimation, is 60 to 65%. The  left ventricle has normal function. The left ventricle has no regional  wall motion abnormalities. There is mild left ventricular hypertrophy.  Left ventricular diastolic parameters  are consistent with Grade I diastolic dysfunction (impaired relaxation).   2. Right ventricular systolic function is normal. The right ventricular  size is normal. Mildly increased right ventricular wall thickness.   3. The mitral valve is grossly normal. Trivial mitral valve  regurgitation.   4. The aortic valve is tricuspid. Aortic valve regurgitation is not  visualized. No aortic stenosis is present.   5. The inferior vena cava is normal in size with <50% respiratory  variability, suggesting right atrial pressure of 8 mmHg.   6. Agitated saline contrast bubble study was positive with shunting  observed within 3-6 cardiac cycles suggestive of interatrial shunt.   ASSESSMENT  ODA PLACKE is a 88 y.o. female with a PMHx of arthritis, HTN, prediabetes, left sided sciatica and sporadic vertigo who initially presented to the ED from home on 8/2 after a mechanical fall during which she hit the left side of her head without LOC. BP was elevated at 211/112 on arrival, with HR of 85, normal O2 sats and CBG of 113. She had a CT head  at that time which was negative. She was discharged and then came back yesterday evening with new complaint of LLE weakness and slurred speech after  a fall in her kitchen, after which she was unable to get up. On arrival to the ED no slurred speech or aphasia was noted. She was able to elevate her LLE without dropping, but still had feeling of weakness in that limb. She also endorsed some degree of weakness in the contralateral limb, as well as decreased vision bilaterally and difficulty speaking. In the emergency department, she was hypertensive up to 203/80. MRI was without acute intracranial abnormality; however, numerous chronic microhemorrhages and chronic small vessel disease was noted.  - Exam reveals findings consistent with cognitive impairment. No evidence for a delirium. There is no facial droop, she has normal motor function and sensation is intact.  - MRI brain: No acute intracranial abnormality. Numerous chronic microhemorrhages in a peripheral distribution, most typical of cerebral amyloid angiopathy. Multifocal hyperintense T2-weighted signal within the cerebral white matter, most commonly due to chronic small vessel disease. - TTE with no mural thrombus or valvular vegetation. Agitated saline contrast bubble study was positive with shunting observed within 3-6 cardiac cycles suggestive of interatrial shunt. - EKG: Sinus bradycardia; Low voltage QRS; Cannot rule out Anterior infarct, age undetermined - BUN 29 with Cr 1.06, suggestive of possible volume depletion. LFTs normal. No leukocytosis on CBC.  - Impression:  - Altered mental status after a fall that was preceded by symptoms of unsteadiness and vertigo. DDx for the underlying etiology of her fall includes TIA, peripheral vertigo and hypotension. Possible dehydration may be an exacerbating factor.  - Findings on exam are most consistent with cognitive impairment. 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 An incipient degenerative dementia such as Alzheimer's disease is also on the DDx.   RECOMMENDATIONS  - Complete her TIA work up with CTA of head and neck.  - Cardiac telemetry - PT/OT/Speech - HgbA1c, fasting lipid panel - Continue her home ASA - BP management per standard protocol. Goal of 120/80. Of note, amyloid angiopathy and HTN are both risk factors for possible development of gross intracranial hemorrhage.  - Risk factor modification - Frequent neuro checks - NPO until passes stroke swallow screen - Outpatient dementia evaluation.  - Encourage optimal hydration - Home PT to evaluate for falls risk factors at her residence.     ______________________________________________________________________    Bonney SHARK, Daire Okimoto, MD [facility]

## 2023-11-26 NOTE — Progress Notes (Signed)
 Pt transfer to the floor alert and orient x4; pt does not seem happy being in the hospital and keep stating  she's going to get  in touch with her pcp this morning, added she has not been in the hospital for years. Pt refused to get swallow test started she does not have any problem swallowing. Daughter at bedside. Oriented pt to room and call light within reach.

## 2023-11-26 NOTE — Evaluation (Signed)
 Physical Therapy Evaluation Patient Details Name: Tammy Hendricks MRN: [id number] DOB: 1935/09/05 Today's Date: 11/26/2023  History of Present Illness  Pt is an 88 yo female that presented to the ED for vague symptoms including dizziness, weakness, vision changes, speech changes, and seen recently in ED for a fall. PMH of HTN.  Clinical Impression  Patient alert, up in bathroom with tech, frustrated with hospital stay. Pt did not fully answer many questions, family at bedside reported pt uses walking stick, lives with her daughter (family in room), ambulatory. She was able to perform pericare with good balance, verbal cues to sit <> Stand from standard commode with use of safety bar, CGA. She ambulated ~33ft initially with RW, switched to walking stick per baseline. 1-2 LOB with minA to correct, returned to RW use with improved safety. Pt demonstrated BLE strength symmetrical and WFLs, denied light touch sensation changes.  Overall the patient demonstrated deficits (see PT Problem List) that impede the patient's functional abilities, safety, and mobility and would benefit from skilled PT intervention.          If plan is discharge home, recommend the following: A little help with bathing/dressing/bathroom;Assistance with cooking/housework;Assist for transportation;Help with stairs or ramp for entrance   Can travel by private vehicle        Equipment Recommendations Rolling walker (2 wheels)  Recommendations for Other Services       Functional Status Assessment Patient has had a recent decline in their functional status and demonstrates the ability to make significant improvements in function in a reasonable and predictable amount of time.     Precautions / Restrictions Precautions Precautions: Fall Restrictions Weight Bearing Restrictions Per Provider Order: No      Mobility  Bed Mobility               General bed mobility comments: pt up in bathroom upon PT arrival     Transfers Overall transfer level: Needs assistance Equipment used: Rolling walker (2 wheels), Straight cane (IV pole) Transfers: Sit to/from Stand Sit to Stand: Contact guard assist           General transfer comment: from commode, verbal cues for safety bar use    Ambulation/Gait Ambulation/Gait assistance: Contact guard assist, Min assist Gait Distance (Feet): 60 Feet Assistive device: Rolling walker (2 wheels), Straight cane   Gait velocity: decreased     General Gait Details: limited distance due to BP (elevated) 1-2 LOB noted with walking stick and dynamic tasks/turning, improved safety with RW use  Stairs            Wheelchair Mobility     Tilt Bed    Modified Rankin (Stroke Patients Only)       Balance Overall balance assessment: Needs assistance Sitting-balance support: Feet supported Sitting balance-Leahy Scale: Good Sitting balance - Comments: pericare without assistance   Standing balance support: Single extremity supported, Bilateral upper extremity supported, During functional activity Standing balance-Leahy Scale: Fair Standing balance comment: improved safety and steadiness noted with BUE supported                             Pertinent Vitals/Pain Pain Assessment Pain Assessment: No/denies pain    Home Living Family/patient expects to be discharged to:: Private residence Living Arrangements: Children Available Help at Discharge: Family Type of Home: House Home Access: Stairs to enter Entrance Stairs-Rails: Right Entrance Stairs-Number of Steps: 3   Home Layout: One level Home Equipment:  Other (comment) (walking stick)      Prior Function               Mobility Comments: pt not forthcoming with information, modI with walking stick for mobility at least, family denied any falls in the last 6 months       Extremity/Trunk Assessment   Upper Extremity Assessment Upper Extremity Assessment: Defer to OT  evaluation    Lower Extremity Assessment Lower Extremity Assessment:  (strength WFLs, pt denied sensation changes)       Communication        Cognition Arousal: Alert Behavior During Therapy: WFL for tasks assessed/performed   PT - Cognitive impairments: No apparent impairments                       PT - Cognition Comments: pt frustrated, did not provide clear orientation questions Following commands: Intact       Cueing Cueing Techniques: Verbal cues     General Comments      Exercises     Assessment/Plan    PT Assessment Patient needs continued PT services  PT Problem List Decreased activity tolerance;Decreased mobility;Decreased balance;Decreased knowledge of use of DME       PT Treatment Interventions DME instruction;Balance training;Gait training;Neuromuscular re-education;Stair training;Patient/family education;Functional mobility training;Therapeutic activities;Therapeutic exercise    PT Goals (Current goals can be found in the Care Plan section)  Acute Rehab PT Goals Patient Stated Goal: to go home PT Goal Formulation: With patient Time For Goal Achievement: 12/10/23 Potential to Achieve Goals: Good    Frequency Min 2X/week     Co-evaluation               AM-PAC PT 6 Clicks Mobility  Outcome Measure Help needed turning from your back to your side while in a flat bed without using bedrails?: None Help needed moving from lying on your back to sitting on the side of a flat bed without using bedrails?: None Help needed moving to and from a bed to a chair (including a wheelchair)?: None Help needed standing up from a chair using your arms (e.g., wheelchair or bedside chair)?: None Help needed to walk in hospital room?: A Little Help needed climbing 3-5 steps with a railing? : A Little 6 Click Score: 22    End of Session   Activity Tolerance: Patient tolerated treatment well Patient left: in chair;with family/visitor present;with  call bell/phone within reach (RN notified of no chair alarm, pt refusal)   PT Visit Diagnosis: Other abnormalities of gait and mobility (R26.89);Difficulty in walking, not elsewhere classified (R26.2);Muscle weakness (generalized) (M62.81)    Time: 9074-9063 PT Time Calculation (min) (ACUTE ONLY): 11 min   Charges:   PT Evaluation $PT Eval Low Complexity: 1 Low   PT General Charges $$ ACUTE PT VISIT: 1 Visit        Doyal Shams PT, DPT 11:49 AM,11/26/23

## 2023-11-26 NOTE — Evaluation (Signed)
 Occupational Therapy Evaluation Patient Details Name: Tammy Hendricks MRN: [id number] DOB: 1935/05/09 Today's Date: 11/26/2023   History of Present Illness   Pt is an 88 yo female that presented to the ED for vague symptoms including dizziness, weakness, vision changes, speech changes, and seen recently in ED for a fall. PMH of HTN.     Clinical Impressions Pt was seen for OT evaluation this date. Pt presents to acute OT demonstrating impaired ADL performance and functional mobility 2/2 decreased safety awareness, problem solving, balance, and activity tolerance (See OT problem list for additional functional deficits). Pt currently requires CGA for ADL transfers and mobility with RW requiring intermittent VC for RW safety as she tends to pick up the RW or let go with one hand while moving. Pt tolerated standing at the sink for grooming tasks with set up and VC to initiate. HR up to 120, pt denies complaints. Pt would benefit from skilled OT services to address noted impairments and functional limitations (see below for any additional details) in order to maximize safety and independence while minimizing falls risk and caregiver burden. Anticipate the need for follow up OT services upon acute hospital DC.    If plan is discharge home, recommend the following:   A little help with walking and/or transfers;A little help with bathing/dressing/bathroom;Assistance with cooking/housework;Assist for transportation;Help with stairs or ramp for entrance;Supervision due to cognitive status;Direct supervision/assist for medications management;Direct supervision/assist for financial management     Functional Status Assessment   Patient has had a recent decline in their functional status and demonstrates the ability to make significant improvements in function in a reasonable and predictable amount of time.     Equipment Recommendations   None recommended by OT     Recommendations for Other  Services         Precautions/Restrictions   Precautions Precautions: Fall Recall of Precautions/Restrictions: Impaired Restrictions Weight Bearing Restrictions Per Provider Order: No     Mobility Bed Mobility               General bed mobility comments: NT, in recliner at start and end of session    Transfers Overall transfer level: Needs assistance Equipment used: Rolling walker (2 wheels), Straight cane Transfers: Sit to/from Stand Sit to Stand: Contact guard assist                  Balance Overall balance assessment: Needs assistance Sitting-balance support: Feet supported Sitting balance-Leahy Scale: Good     Standing balance support: Single extremity supported, Bilateral upper extremity supported, During functional activity, No upper extremity supported, Reliant on assistive device for balance Standing balance-Leahy Scale: Fair Standing balance comment: fair balance at sink for grooming with varying UE support on the counter, required BUE Support for fair dynamic balance with RW        ADL either performed or assessed with clinical judgement   ADL Overall ADL's : Needs assistance/impaired     Grooming: Standing;Contact guard assist;Oral care;Wash/dry face          Functional mobility during ADLs: Contact guard assist;Cueing for safety;Rolling walker (2 wheels)        Pertinent Vitals/Pain Pain Assessment Pain Assessment: No/denies pain     Extremity/Trunk Assessment Upper Extremity Assessment Upper Extremity Assessment: Overall WFL for tasks assessed   Lower Extremity Assessment Lower Extremity Assessment: Defer to PT evaluation       Communication     Cognition Arousal: Alert Behavior During Therapy: [facility] for tasks assessed/performed Cognition:  No family/caregiver present to determine baseline      OT - Cognition Comments: pt alert, tends to deflect questions        Following commands: Intact       Cueing  General  Comments   Cueing Techniques: Verbal cues  HR up to 120 with standing grooming at sink    Home Living Family/patient expects to be discharged to:: Private residence Living Arrangements: Children Available Help at Discharge: Family Type of Home: House Home Access: Stairs to enter Secretary/administrator of Steps: 3 Entrance Stairs-Rails: Right Home Layout: One level     Bathroom Shower/Tub: Chief Strategy Officer: Standard Bathroom Accessibility: No   Home Equipment: Other (comment) (walking stick)          Prior Functioning/Environment    Mobility Comments: pt not forthcoming with information, modI with walking stick for mobility at least, family denied any falls in the last 6 months      OT Problem List: Decreased safety awareness;Decreased activity tolerance;Impaired balance (sitting and/or standing);Decreased knowledge of use of DME or AE   OT Treatment/Interventions: Therapeutic exercise;Self-care/ADL training;Therapeutic activities;Cognitive remediation/compensation;DME and/or AE instruction;Energy conservation;Patient/family education;Balance training      OT Goals(Current goals can be found in the care plan section)   Acute Rehab OT Goals Patient Stated Goal: go home OT Goal Formulation: With patient Time For Goal Achievement: 12/10/23 Potential to Achieve Goals: Good ADL Goals Pt Will Perform Lower Body Dressing: with supervision;sit to/from stand Pt Will Transfer to Toilet: ambulating;with supervision (LRAD) Pt Will Perform Toileting - Clothing Manipulation and hygiene: sit to/from stand;with supervision Additional ADL Goal #1: Pt will complete grooming/ADL tasks at the sink wiht supervision for safety, demonstrating good safety awareness.   OT Frequency:  Min 2X/week       AM-PAC OT 6 Clicks Daily Activity     Outcome Measure Help from another person eating meals?: None Help from another person taking care of personal grooming?: A  Little Help from another person toileting, which includes using toliet, bedpan, or urinal?: A Little Help from another person bathing (including washing, rinsing, drying)?: A Little Help from another person to put on and taking off regular upper body clothing?: A Little Help from another person to put on and taking off regular lower body clothing?: A Little 6 Click Score: 19   End of Session Equipment Utilized During Treatment: Rolling walker (2 wheels)  Activity Tolerance: Patient tolerated treatment well Patient left: in chair;with call bell/phone within reach;with chair alarm set  OT Visit Diagnosis: Other abnormalities of gait and mobility (R26.89);Muscle weakness (generalized) (M62.81)                Time: 8464-8453 OT Time Calculation (min): 11 min Charges:  OT General Charges $OT Visit: 1 Visit OT Evaluation $OT Eval Low Complexity: 1 Low  Warren SAUNDERS., MPH, MS, OTR/L ascom [phone] 11/26/23, 4:34 PM

## 2023-11-26 NOTE — Plan of Care (Signed)

## 2023-11-26 NOTE — H&P (Signed)
 History and Physical    Patient: Tammy Hendricks FMW:[id number] DOB: 1935/07/09 DOA: 11/25/2023 DOS: the patient was seen and examined on 11/26/2023 PCP: Fernand Fredy RAMAN, MD  Patient coming from: Home  Chief Complaint:  Chief Complaint  Patient presents with   Stroke Symptoms   HPI: Tammy Hendricks is a 88 y.o. female with medical history significant of hypertension presents to the emergency department from home with her daughter for evaluation of weakness x 1 day.  Patient was evaluated in the emergency department on 8/2 after mechanical fall where she did strike her head.  She denied loss of consciousness.  She is reportedly back to her baseline, until yesterday when patient felt funny.  She was unable to specify what fell off.  Her daughter present at bedside states her speech was a little slow but no facial droop or slurred speech was noticed. Daughter also notes increase in falls recently.  The patient denies any dizziness, feeling off balance, focal numbness or tingling, vision changes, or pain in any joints.  Of note, she was seen on 7/18 by PCP for evaluation of dizziness and right leg weakness which had resolved.  In the emergency department, she was hypertensive up to 203/80.  MRI without acute intracranial abnormality, however numerous chronic microhemorrhages and chronic small vessel disease was noted.  Patient denies urinary complaints, UA with esterase and many bacteria  Admission was requested for further evaluation/ management.   Review of Systems: Review of Systems  Constitutional:  Negative for chills, fever, malaise/fatigue and weight loss.  Eyes:  Negative for blurred vision, double vision and photophobia.  Respiratory:  Negative for cough and shortness of breath.   Cardiovascular:  Negative for chest pain, palpitations, leg swelling and PND.  Gastrointestinal:  Positive for constipation. Negative for abdominal pain, diarrhea, nausea and vomiting.  Genitourinary:   Negative for dysuria and frequency.  Musculoskeletal:  Positive for falls. Negative for back pain, joint pain and neck pain.  Skin:  Negative for itching and rash.  Neurological:  Positive for weakness. Negative for dizziness, tingling, focal weakness, seizures, loss of consciousness and headaches.  Psychiatric/Behavioral:  Negative for depression, substance abuse and suicidal ideas.     Past Medical History:  Diagnosis Date   Arthritis    right knee   Hypertension    Pre-diabetes    Sciatica    left side - occasional   Vertigo    sporadic   Wears dentures    partial upper, full lower   Past Surgical History:  Procedure Laterality Date   ABDOMINAL HYSTERECTOMY     APPENDECTOMY     CATARACT EXTRACTION W/PHACO Right 11/09/2015   Procedure: CATARACT EXTRACTION PHACO AND INTRAOCULAR LENS PLACEMENT (IOC) right eye;  Surgeon: Dene Etienne, MD;  Location: [facility];  Service: Ophthalmology;  Laterality: Right;   CATARACT EXTRACTION W/PHACO Left 12/03/2017   Procedure: CATARACT EXTRACTION PHACO AND INTRAOCULAR LENS PLACEMENT (IOC) LEFT EYE;  Surgeon: Etienne Dene, MD;  Location: [facility];  Service: Ophthalmology;  Laterality: Left;   CHOLECYSTECTOMY     COLONOSCOPY     OOPHORECTOMY     TONSILLECTOMY     Social History:  reports that she has never smoked. She has never used smokeless tobacco. She reports that she does not drink alcohol. No history on file for drug use.  No Known Allergies  Family History  Problem Relation Age of Onset   Dementia Mother    Hypertension Father     Prior  to Admission medications   Medication Sig Start Date End Date Taking? Authorizing Provider  acetaminophen  (TYLENOL ) 650 MG CR tablet Take 650 mg by mouth every 8 (eight) hours as needed for pain.    [provider]  aspirin  EC 81 MG tablet Take 81 mg by mouth daily. Swallow whole.    [provider]  benzonatate  (TESSALON  PERLES) 100 MG capsule  Take 1 capsule (100 mg total) by mouth 3 (three) times daily as needed for cough. Patient not taking: Reported on 11/08/2023 08/27/23 08/26/24  Fernand Fredy RAMAN, MD  brimonidine  (ALPHAGAN ) 0.2 % ophthalmic solution Place 1 drop into both eyes 2 (two) times daily.    [provider]  fluticasone  (FLONASE ) 50 MCG/ACT nasal spray Place 1 spray into both nostrils daily. 10/03/23 10/02/24  Fernand Fredy RAMAN, MD  hydrochlorothiazide  (HYDRODIURIL ) 25 MG tablet TAKE 1 TABLET BY MOUTH ONCE  DAILY 09/02/23   Orlean Alan HERO, FNP  Krill Oil 500 MG CAPS Take 1 capsule by mouth daily.    [provider]  latanoprost (XALATAN) 0.005 % ophthalmic solution Place 1 drop into both eyes at bedtime.    [provider]  loratadine (CLARITIN) 10 MG tablet Take 10 mg by mouth daily.    [provider]  meloxicam  (MOBIC ) 15 MG tablet Take 1 tablet (15 mg total) by mouth daily. 09/06/23   Fernand Fredy RAMAN, MD  pantoprazole  (PROTONIX ) 40 MG tablet Take 1 tablet (40 mg total) by mouth daily. 10/03/23 10/02/24  Fernand Fredy RAMAN, MD  potassium chloride  SA (KLOR-CON  M) 20 MEQ tablet Take 1 tablet (20 mEq total) by mouth daily. 07/12/23   Fernand Fredy RAMAN, MD  senna-docusate (SENOKOT-S) 8.6-50 MG tablet Take 1 tablet by mouth daily.    [provider]  timolol  (TIMOPTIC ) 0.5 % ophthalmic solution  08/06/13   [provider]  lisinopril -hydrochlorothiazide  (ZESTORETIC) 20-25 MG tablet Take 1 tablet by mouth daily. 02/26/20 03/19/20  [provider]    Physical Exam: Vitals:   11/26/23 0400 11/26/23 0515 11/26/23 0521 11/26/23 0629  BP: (!) 177/89   (!) 176/90  Pulse: 100 (!) 102  91  Resp: 16 14  16   Temp:   98.3 F (36.8 C) 98 F (36.7 C)  TempSrc:   Oral   SpO2: 100% 99%  100%  Weight:    77.1 kg  Height:      Physical Exam Vitals reviewed.  HENT:     Head: Normocephalic.     Comments: Left peri auricular swelling, no skin tears or bruising     Mouth/Throat:     Mouth:  Mucous membranes are moist.  Eyes:     General: No scleral icterus.    Extraocular Movements: Extraocular movements intact.     Pupils: Pupils are equal, round, and reactive to light.  Cardiovascular:     Rate and Rhythm: Normal rate and regular rhythm.  Pulmonary:     Effort: Pulmonary effort is normal. No respiratory distress.     Breath sounds: Normal breath sounds. No wheezing.  Abdominal:     General: Abdomen is flat. There is no distension.     Palpations: Abdomen is soft.     Tenderness: There is no abdominal tenderness.  Musculoskeletal:        General: No swelling.     Cervical back: Neck supple.     Right lower leg: No edema.     Left lower leg: No edema.  Skin:    General:  Skin is warm and dry.     Capillary Refill: Capillary refill takes less than 2 seconds.  Neurological:     Mental Status: She is alert and oriented to person, place, and time.     Cranial Nerves: No cranial nerve deficit.     Sensory: No sensory deficit.     Motor: No weakness.     Coordination: Coordination normal.  Psychiatric:        Attention and Perception: Attention normal.        Mood and Affect: Mood normal.        Speech: Speech is delayed.        Cognition and Memory: Cognition is impaired.     Data Reviewed:   Labs on Admission: I have personally reviewed following labs and imaging studies  CBC: Recent Labs  Lab 11/25/23 1817  WBC 5.0  NEUTROABS 2.3  HGB 10.4*  HCT 32.5*  MCV 75.9*  PLT 205   Basic Metabolic Panel: Recent Labs  Lab 11/25/23 1817  NA 139  K 3.3*  CL 101  CO2 26  GLUCOSE 75  BUN 29*  CREATININE 1.06*  CALCIUM 9.0   GFR: Estimated Creatinine Clearance: 36.6 mL/min (A) (by C-G formula based on SCr of 1.06 mg/dL (H)). Liver Function Tests: Recent Labs  Lab 11/25/23 1817  AST 22  ALT 18  ALKPHOS 69  BILITOT 0.7  PROT 7.2  ALBUMIN 3.4*   No results for input(s): LIPASE, AMYLASE in the last 168 hours. No results for input(s): AMMONIA  in the last 168 hours. Coagulation Profile: Recent Labs  Lab 11/25/23 1817  INR 1.1   Cardiac Enzymes: No results for input(s): CKTOTAL, CKMB, CKMBINDEX, TROPONINI in the last 168 hours. BNP (last 3 results) No results for input(s): PROBNP in the last 8760 hours. HbA1C: No results for input(s): HGBA1C in the last 72 hours. CBG: No results for input(s): GLUCAP in the last 168 hours. Lipid Profile: Recent Labs    11/26/23 0513  CHOL 135  HDL 42  LDLCALC 82  TRIG 57  CHOLHDL 3.2   Thyroid  Function Tests: Recent Labs    11/26/23 0205  TSH 0.764   Anemia Panel: No results for input(s): VITAMINB12, FOLATE, FERRITIN, TIBC, IRON, RETICCTPCT in the last 72 hours. Urine analysis:    Component Value Date/Time   COLORURINE YELLOW (A) 11/26/2023 0345   APPEARANCEUR HAZY (A) 11/26/2023 0345   LABSPEC 1.016 11/26/2023 0345   PHURINE 7.0 11/26/2023 0345   GLUCOSEU NEGATIVE 11/26/2023 0345   HGBUR NEGATIVE 11/26/2023 0345   BILIRUBINUR NEGATIVE 11/26/2023 0345   KETONESUR NEGATIVE 11/26/2023 0345   PROTEINUR NEGATIVE 11/26/2023 0345   NITRITE NEGATIVE 11/26/2023 0345   LEUKOCYTESUR SMALL (A) 11/26/2023 0345    Radiological Exams on Admission: MR BRAIN WO CONTRAST Result Date: 11/26/2023 EXAM: MRI BRAIN WITHOUT CONTRAST 11/26/2023 12:50:00 AM TECHNIQUE: Multiplanar multisequence MRI of the head/brain was performed without the administration of intravenous contrast. COMPARISON: None available. CLINICAL HISTORY: Neuro deficit, acute, stroke suspected. 88 y.o. female with a history of hypertension who presents after a mechanical fall. Patient reports she tripped over an object at home, fell and hit her head. She has no neck pain, no back pain, no chest pain, no abdominal pain no extremity injuries. FINDINGS: BRAIN AND VENTRICLES: No acute infarct. No mass. No midline shift. No hydrocephalus. Numerous chronic micro hemorrhages in a peripheral distribution most  typical of cerebral amyloid angiopathy. Multifocal hyperintense T2-weighted signal within the cerebral white matter, most commonly  due to chronic small vessel disease. The sella is unremarkable. Normal flow voids. ORBITS: No acute abnormality. SINUSES AND MASTOIDS: No acute abnormality. BONES AND SOFT TISSUES: Normal marrow signal. No acute soft tissue abnormality. IMPRESSION: 1. No acute intracranial abnormality. 2. Numerous chronic microhemorrhages in a peripheral distribution, most typical of cerebral amyloid angiopathy. 3. Multifocal hyperintense T2-weighted signal within the cerebral white matter, most commonly due to chronic small vessel disease. Electronically signed by: Franky Stanford MD 11/26/2023 01:13 AM EDT RP Workstation: [id number]   CT HEAD WO CONTRAST Result Date: 11/25/2023 EXAM: CT HEAD WITHOUT 11/25/2023 06:39:20 PM TECHNIQUE: CT of the head was performed without the administration of intravenous contrast. Automated exposure control, iterative reconstruction, and/or weight based adjustment of the mA/kV was utilized to reduce the radiation dose to as low as reasonably achievable. COMPARISON: 11/24/23 CLINICAL HISTORY: Neuro deficit, acute, stroke suspected. Patient to ED via POV for left leg weakness and slow speech per daughter, BARI 0030 when patient went to sleep. No slurred speech noted or aphasia. Pt able to hold up left leg for 10 sec without dropping but states it feels weak in comparison to right. Daughter reports fall on 8/2 and was seen in ED for same. FINDINGS: BRAIN AND VENTRICLES: No acute intracranial hemorrhage. No mass effect or midline shift. No extra-axial fluid collection. Gray-white differentiation is maintained. No hydrocephalus. Chronic microvascular ischemia and generalized atrophy. ORBITS: No acute abnormality. SINUSES AND MASTOIDS: No acute abnormality. SOFT TISSUES AND SKULL: No acute skull fracture. No acute soft tissue abnormality. IMPRESSION: 1. No acute intracranial  abnormality. Electronically signed by: Norman Gatlin MD 11/25/2023 07:36 PM EDT RP Workstation: [id number]       Assessment and Plan: No notes have been filed under this hospital service. Service: Hospitalist  88 y.o. female with medical history significant of hypertension presents to the emergency department from home with her daughter for evaluation of weakness x 24 hours, in setting of recent falls. MRI with chronic finding suggestive of cerebral amyloid angiopathy.  Workup otherwise significant for possible UTI.   Weakness  - with the above MRI findings. NIH 0 on my exam.  - PT/OT, precautions - ECHO, monitor on telemetry  - Neurology evaluation appreciated - cont asa   ?UTI - pt denies GU symptoms, but given vague complaints will treat with rocephin  pending urine culture   HTN  - resume home antihypertensives.   Lovenox   Heart healthy diet  No IVF Monitor/replace electrolytes   Advance Care Planning:   Code Status: Full Code discussed with patient at time of admission   Consults: Neurology   Family Communication: Daughter present at bedside  Severity of Illness: The appropriate patient status for this patient is OBSERVATION. Observation status is judged to be reasonable and necessary in order to provide the required intensity of service to ensure the patient's safety. The patient's presenting symptoms, physical exam findings, and initial radiographic and laboratory data in the context of their medical condition is felt to place them at decreased risk for further clinical deterioration. Furthermore, it is anticipated that the patient will be medically stable for discharge from the hospital within 2 midnights of admission.   Author: Daved JAYSON Pump, DO 11/26/2023 7:20 AM  For on call review [email or web address].

## 2023-11-27 ENCOUNTER — Observation Stay

## 2023-11-27 DIAGNOSIS — I700 Atherosclerosis of aorta: Secondary | ICD-10-CM | POA: Diagnosis not present

## 2023-11-27 DIAGNOSIS — R29818 Other symptoms and signs involving the nervous system: Secondary | ICD-10-CM | POA: Diagnosis not present

## 2023-11-27 DIAGNOSIS — I651 Occlusion and stenosis of basilar artery: Secondary | ICD-10-CM | POA: Diagnosis not present

## 2023-11-27 MED ORDER — IOHEXOL 350 MG/ML SOLN
75.0000 mL | Freq: Once | INTRAVENOUS | Status: AC | PRN
  Administered 2023-11-27: 75 mL via INTRAVENOUS

## 2023-11-27 MED ORDER — LOSARTAN POTASSIUM 25 MG PO TABS
25.0000 mg | ORAL_TABLET | Freq: Every day | ORAL | Status: DC
  Administered 2023-11-28 – 2023-11-29 (×2): 25 mg via ORAL
  Filled 2023-11-27 (×2): qty 1

## 2023-11-27 MED ORDER — LOSARTAN POTASSIUM 25 MG PO TABS: 25.0000 mg | ORAL_TABLET | Freq: Every day | ORAL | 0 refills | Status: DC

## 2023-11-27 NOTE — Care Management Obs Status (Signed)
 MEDICARE OBSERVATION STATUS NOTIFICATION   Patient Details  Name: Tammy Hendricks MRN: [id number] Date of Birth: 29-Mar-1936   Medicare Observation Status Notification Given:  Chaney BRANDY CHRISTIANE LELON, CMA 11/27/2023, 11:02 AM

## 2023-11-27 NOTE — Hospital Course (Addendum)
 Hospital course / significant events:   HPI: Tammy Hendricks is a 88 y.o. female with medical history significant of hypertension presents to the emergency department 08/04 from home with her daughter for evaluation of weakness x 1 day. Of note had also been seen 08/02 in ED after fall at home w/ head trauma, no LOC. Was reportedly back to her baseline, until yesterday when patient felt funny.  She was unable to specify what fell off.  Her daughter present at bedside states her speech was a little slow but no facial droop or slurred speech was noticed. Daughter also notes increase in falls recently, was seen on 7/18 by PCP for evaluation of dizziness and right leg weakness which had resolved.   08/05: hypertensive up to 203/80.  MRI without acute intracranial abnormality, however numerous chronic microhemorrhages and chronic small vessel disease was noted.  Patient denies urinary complaints, UA with esterase and many bacteria. Admitted to hospitalist w/ neurology consult. MRI brain neg for acute CVA, did show concern for cerebral amyloid angiopathy and chronic small vessel disease. Echo concern for small ASD but no clots 08/06: CTA H/N pending. Per cardiology, ASA sufficient and can follow outpatient re: echo findings. I reviewed hospital course and findings / plan with patient and with her daughter over the phone. If CTA H/N normal can discharge - may need to wait until report is back tomorrow      Consultants:  Neurology  Informal discussion w/ cardiology but no formal consult   Procedures/Surgeries: none      ASSESSMENT & PLAN:   Altered mental status, resolved. Likely d/t hypertensive urgency Multifocal hyperintense T2-weighted signal within the cerebral white matter on MRI brain, clinically significant and most commonly due to chronic small vessel disease.  Chronic microhemorrhages in a peripheral distribution on MRI brain, clinically significant and most typical of cerebral  amyloid angiopathy.  without delirium/encephalopathy and without acute stroke per neurology on this hospitalization  CTA head/neck pending Continue ASA BP management per standard protocol. Goal of 120/80. Of note, amyloid angiopathy and HTN are both risk factors for possible development of gross intracranial hemorrhage.  Outpatient follow up with neurology for dementia evaluation.   TTE bubble study was positive with shunting observed within 3-6 cardiac cycles suggestive of interatrial shunt.  Outpatient cardiology follow up  Aspirin     HTN  Question hypertensive urgency contributing to AMS Goals BP 120/80 resume home antihypertensives hydrochlorothiazide  and added losartan  Close outpatient follow up .    Class 1 obesity based on BMI: Body mass index is 31.09 kg/m.SABRA Significantly low or high BMI is associated with higher medical risk.  Underweight - under 18  overweight - 25 to 29 obese - 30 or more Class 1 obesity: BMI of 30.0 to 34 Class 2 obesity: BMI of 35.0 to 39 Class 3 obesity: BMI of 40.0 to 49 Super Morbid Obesity: BMI 50-59 Super-super Morbid Obesity: BMI 60+ Healthy nutrition and physical activity advised as adjunct to other disease management and risk reduction treatments    DVT prophylaxis: lovenox  IV fluids: no continuous IV fluids  Nutrition: regular diet  Central lines / other devices: none  Code Status: FULL CODE ACP documentation reviewed:  none on file in [facility]  [facility] needs: Home health Medical barriers to dispo: CTA H/N results. Expected medical readiness for discharge once those are back / if normal can discharge and if abnormal will need vascular surgery or neurosurgery consult .

## 2023-11-27 NOTE — Evaluation (Addendum)
 Speech Language Pathology Evaluation Patient Details Name: Tammy Hendricks MRN: [id number] DOB: Aug 15, 1935 Today's Date: 11/27/2023 Time: 0820-0925 SLP Time Calculation (min) (ACUTE ONLY): 65 min  Problem List:  Patient Active Problem List   Diagnosis Date Noted   Subacute neurologic deficit 11/26/2023   Acute URI 09/13/2023   Gastroesophageal reflux disease without esophagitis 09/06/2023   Prediabetes 04/02/2023   Absolute anemia 04/02/2023   Impaired glucose tolerance 06/28/2022   Mixed hyperlipidemia 06/28/2022   Essential hypertension, benign 06/28/2022   Benign paroxysmal positional vertigo due to bilateral vestibular disorder 06/28/2022   Primary open angle glaucoma (POAG) of both eyes 06/28/2022   Past Medical History:  Past Medical History:  Diagnosis Date   Arthritis    right knee   Hypertension    Pre-diabetes    Sciatica    left side - occasional   Vertigo    sporadic   Wears dentures    partial upper, full lower   Past Surgical History:  Past Surgical History:  Procedure Laterality Date   ABDOMINAL HYSTERECTOMY     APPENDECTOMY     CATARACT EXTRACTION W/PHACO Right 11/09/2015   Procedure: CATARACT EXTRACTION PHACO AND INTRAOCULAR LENS PLACEMENT (IOC) right eye;  Surgeon: Dene Etienne, MD;  Location: [facility];  Service: Ophthalmology;  Laterality: Right;   CATARACT EXTRACTION W/PHACO Left 12/03/2017   Procedure: CATARACT EXTRACTION PHACO AND INTRAOCULAR LENS PLACEMENT (IOC) LEFT EYE;  Surgeon: Etienne Dene, MD;  Location: [facility];  Service: Ophthalmology;  Laterality: Left;   CHOLECYSTECTOMY     COLONOSCOPY     OOPHORECTOMY     TONSILLECTOMY     HPI:  Pt Pt is an 88 yo female that presented to the ED for vague symptoms including dizziness, weakness, vision changes, speech changes, and seen recently in ED for a fall. PMH of HTN, GERD, preDM, glaucoma, hyperlipidemia, Benign paroxysmal positional vertigo due to bilateral  vestibular disorder per chart.   Head Imaging:  No acute intracranial abnormality related to the reported head trauma.  2. Numerous chronic microhemorrhages in a peripheral distribution, most typical  of cerebral amyloid angiopathy.  3. Multifocal hyperintense T2-weighted signal within the cerebral white matter, most commonly due to Chronic small vessel disease.  Pt lives w/ her Daughter.   Assessment / Plan / Recommendation Clinical Impression   Pt seen today for informal Cognitive-linguistic screening/assessment at bedside using Interview, functional ADL tasks, and few questions from assessment tools.  Pt awake, verbal and followed all directions w/ cue. Pt was quite pleasant and engaged appropriately w/ this SLP. Noted min slower reponses intermittently- she seemed to pause b/f answering at times. She participated in choosing food items for breakfast meal- Time needed for making choices. She followed directions w/ cue. Noted min impulsivity/decreased awareness.      On RA, afebrile. WBC WNL. Noted Head Imaging:  No acute intracranial abnormality related to the reported head trauma.  2. Numerous chronic microhemorrhages in a peripheral distribution, most typical  of cerebral amyloid angiopathy.  3. Multifocal hyperintense T2-weighted signal within the cerebral white matter, most commonly due to Chronic small vessel disease.  Pt lives w/ her Daughter.    Pt presents with apparent mild Cognitive communication impairment characterized by deficits in the areas of attention, awareness/insight, memory recall, mental flexibility, and executive functioning re: problem-solving and reasoning. Deficits may impact ability to independently manage finances, Medications, and maintain safety in the home environment, especially if alone. Unsure of pt's Baseline Cognitive functioning; noted Head Imaging  indicating Chronic small vessel disease.    Primary deficits were associated with memory retrieval tasks, basic  calculations, insight of self and problem-situations(she attempted getting up to go to the bathroom w/out asking for help first/using call bell), and tasks of higher level executive functioning(fluency, recall of information dicussed re: problem situations). Noted Hesitations and Pauses during processing/recalling information. Pt's performance improved with cueing during tasks(washing clothes, making a sandwich). Her strengths included recall of information from the Past(maintaining a home Garden, canning/jarring foods).  Further assessment(more formal assessment post return home) is indicated to determine Baseline and if any new change/needs for tx and f/u.   With Imaging reports and presentation today, suspect potential, min Cognitive-linguistic decline and need for Supervision in the home and during ADLs for safety and success. F/u Cognitive-linguistic intervention could be beneficial w/ ongoing assessment of Cognitive ADLs in the D/C and/or home environment. Would recommend f/u w/ Neurology for formal assessment of pt's Cognitive function/status if concern.  Recommend f/u ST services in the Outpatient/home/SNF setting to continue to address Cognitive tasks in ADLs for safety and success and decrease caregiver burden. MD/NSG updated.    ADDENDUM- BSE: pt appears to present w/ functional oropharyngeal phase swallowing w/ no overt clinical s/s of Dysphagia- pt was assessed at bedside during her Breakfast meal. She consumed thin liquids and soft solids foods (cheese toast, grits) w/ No overt s/s of aspiration; no cough nor decline in respiratory presentation. OM Exam appeared [facility] w/ no unilateral weakness noted. Partial/lower Denture+. Speech Clear. Pt fed self w/ setup support.  Recommend continue a fairly Regular consistency diet w/ well-Cut meats, moistened foods; Thin liquids. Recommend general aspiration precautions, Pills WHOLE in Puree for safer, easier swallowing if needed. Education given on Pills in  Puree if needed; food consistencies and easy to eat options; general aspiration precautions to pt. NSG/MD to reconsult if any new needs arise while admitted. MD updated. Recommend Dietician f/u for support.  SLP Assessment  SLP Recommendation/Assessment: All further Speech Language Pathology needs can be addressed in the next venue of care (if needs indicate) SLP Visit Diagnosis: Cognitive communication deficit (R41.841)     Assistance Recommended at Discharge  Frequent or constant Supervision/Assistance  Functional Status Assessment  (difficult to assess today)  Frequency and Duration  (n/a)   (n/a)      SLP Evaluation Cognition  Overall Cognitive Status: Difficult to assess (min slower responses at times; pauses) Arousal/Alertness: Awake/alert Orientation Level: Oriented to person;Oriented to place;Oriented to situation Attention: Focused;Sustained Focused Attention: Impaired (min) Focused Attention Impairment: Verbal complex Sustained Attention: Impaired (min) Sustained Attention Impairment: Verbal complex Memory: Impaired Memory Impairment: Decreased recall of new information (items ordered at breakfast) Awareness: Impaired (min) Awareness Impairment: Anticipatory impairment Problem Solving: Impaired Problem Solving Impairment: Verbal complex (min) Executive Function: Decision Making Decision Making: Impaired (min) Decision Making Impairment: Verbal complex Behaviors:  (none) Safety/Judgment:  (needs more assessment) Comments: able to arrive at calling 911 for emergency situations w/ cue       Comprehension  Auditory Comprehension Overall Auditory Comprehension: Appears within functional limits for tasks assessed (w/ functional tasks)    Expression Verbal Expression Overall Verbal Expression: Appears within functional limits for tasks assessed (w/ functional tasks) Written Expression Dominant Hand: Right Written Expression: Not tested   Oral / Motor  Oral  Motor/Sensory Function Overall Oral Motor/Sensory Function: Within functional limits (partial/full dentures) Motor Speech Overall Motor Speech: Appears within functional limits for tasks assessed Respiration: Within functional limits Phonation: Normal Resonance: Within functional  limits Articulation: Within functional limitis Intelligibility: Intelligible Motor Planning: Within functional limits Motor Speech Errors: Not applicable               Comer Portugal, MS, CCC-SLP Speech Language Pathologist Rehab Services; [facility] [phone] (ascom) Mayre Bury 11/27/2023, 4:09 PM

## 2023-11-27 NOTE — Discharge Summary (Incomplete)
 Physician Discharge Summary   Patient: Tammy Hendricks MRN: [id number]  DOB: June 10, 1935   Admit:     Date of Admission: 11/25/2023 Admitted from: home   Discharge: Date of discharge: 11/27/23 Disposition: Home Condition at discharge: good  CODE STATUS: FULL CODE     Discharge Physician: Laneta Blunt, DO [facility]     PCP: Fernand Fredy RAMAN, MD  Recommendations for Outpatient Follow-up:  Follow up with PCP Fernand Fredy RAMAN, MD in 1-2 weeks Follow w/ neurology Follow w/ cardiology        Discharge Diagnoses: Principal Problem:   Subacute neurologic deficit Active Problems:   Essential hypertension, benign   Benign paroxysmal positional vertigo due to bilateral vestibular disorder      Hospital course / significant events:   HPI: Tammy Hendricks is a 88 y.o. female with medical history significant of hypertension presents to the emergency department 08/04 from home with her daughter for evaluation of weakness x 1 day. Of note had also been seen 08/02 in ED after fall at home w/ head trauma, no LOC. Was reportedly back to her baseline, until yesterday when patient felt funny.  She was unable to specify what fell off.  Her daughter present at bedside states her speech was a little slow but no facial droop or slurred speech was noticed. Daughter also notes increase in falls recently, was seen on 7/18 by PCP for evaluation of dizziness and right leg weakness which had resolved.   08/05: hypertensive up to 203/80.  MRI without acute intracranial abnormality, however numerous chronic microhemorrhages and chronic small vessel disease was noted.  Patient denies urinary complaints, UA with esterase and many bacteria. Admitted to hospitalist w/ neurology consult. MRI brain neg for acute CVA, did show concern for cerebral amyloid angiopathy and chronic small vessel disease. Echo concern for small ASD but no clots 08/06: CTA H/N ***. Per cardiology, ASA  sufficient and can follow outpatient re: echo findings. I reviewed hospital course and findings / plan with patient and with her daughter over the phone.       Consultants:  Neurology  Informal discussion w/ cardiology but no formal consult   Procedures/Surgeries: none      ASSESSMENT & PLAN:   Altered mental status, resolved. Likely d/t hypertensive urgency Multifocal hyperintense T2-weighted signal within the cerebral white matter on MRI brain, clinically significant and most commonly due to chronic small vessel disease.  Chronic microhemorrhages in a peripheral distribution on MRI brain, clinically significant and most typical of cerebral amyloid angiopathy.  without delirium/encephalopathy and without acute stroke per neurology on this hospitalization  CTA head/neck *** Continue ASA BP management per standard protocol. Goal of 120/80. Of note, amyloid angiopathy and HTN are both risk factors for possible development of gross intracranial hemorrhage.  Outpatient follow up with neurology for dementia evaluation.   TTE bubble study was positive with shunting observed within 3-6 cardiac cycles suggestive of interatrial shunt.  Outpatient cardiology follow up  Aspirin     HTN  Question hypertensive urgency contributing to AMS Goals BP 120/80 resume home antihypertensives hydrochlorothiazide  and added losartan  Close outpatient follow up .    Class 1 obesity based on BMI: Body mass index is 31.09 kg/m.SABRA Significantly low or high BMI is associated with higher medical risk.  Underweight - under 18  overweight - 25 to 29 obese - 30 or more Class 1 obesity: BMI of 30.0 to 34 Class 2 obesity: BMI of 35.0 to 39 Class  3 obesity: BMI of 40.0 to 49 Super Morbid Obesity: BMI 50-59 Super-super Morbid Obesity: BMI 60+ Healthy nutrition and physical activity advised as adjunct to other disease management and risk reduction treatments    DVT prophylaxis: *** IV fluids: ***  continuous IV fluids  Nutrition: *** Central lines / other devices: ***  Code Status: *** ACP documentation reviewed: *** none on file in [facility]  TOC needs: *** Medical barriers to dispo: ***. Expected medical readiness for discharge ***.             Discharge Instructions  Allergies as of 11/27/2023   No Known Allergies      Medication List     STOP taking these medications    benzonatate  100 MG capsule Commonly known as: Tessalon  Perles       TAKE these medications    acetaminophen  650 MG CR tablet Commonly known as: TYLENOL  Take 650 mg by mouth every 8 (eight) hours as needed for pain.   aspirin  EC 81 MG tablet Take 81 mg by mouth daily. Swallow whole.   brimonidine  0.2 % ophthalmic solution Commonly known as: ALPHAGAN  Place 1 drop into both eyes 2 (two) times daily.   fluticasone  50 MCG/ACT nasal spray Commonly known as: FLONASE  Place 1 spray into both nostrils daily.   hydrochlorothiazide  25 MG tablet Commonly known as: HYDRODIURIL  TAKE 1 TABLET BY MOUTH ONCE  DAILY   Krill Oil 500 MG Caps Take 1 capsule by mouth daily.   latanoprost 0.005 % ophthalmic solution Commonly known as: XALATAN Place 1 drop into both eyes at bedtime.   loratadine 10 MG tablet Commonly known as: CLARITIN Take 10 mg by mouth daily.   losartan  25 MG tablet Commonly known as: Cozaar  Take 1 tablet (25 mg total) by mouth daily.   meloxicam  15 MG tablet Commonly known as: MOBIC  Take 1 tablet (15 mg total) by mouth daily.   pantoprazole  40 MG tablet Commonly known as: Protonix  Take 1 tablet (40 mg total) by mouth daily.   potassium chloride  SA 20 MEQ tablet Commonly known as: KLOR-CON  M Take 1 tablet (20 mEq total) by mouth daily.   senna-docusate 8.6-50 MG tablet Commonly known as: Senokot-S Take 1 tablet by mouth daily.   timolol  0.5 % ophthalmic solution Commonly known as: TIMOPTIC          Follow-up Information     Fernand Fredy RAMAN, MD Follow up.    Specialty: Internal Medicine Why: hospital follow up Contact information: [address] [place] [address] [phone]                 No Known Allergies   Subjective: ***   Discharge Exam: BP (!) 180/79 (BP Location: Left Arm)   Pulse 80   Temp 98.1 F (36.7 C)   Resp 17   Ht 5' 2 (1.575 m)   Wt 77.1 kg   SpO2 98%   BMI 31.09 kg/m  *** General: Pt is alert, awake, not in acute distress Cardiovascular: RRR, S1/S2 +, no rubs, no gallops Respiratory: CTA bilaterally, no wheezing, no rhonchi Abdominal: Soft, NT, ND, bowel sounds + Extremities: no edema, no cyanosis     The results of significant diagnostics from this hospitalization (including imaging, microbiology, ancillary and laboratory) are listed below for reference.     Microbiology: No results found for this or any previous visit (from the past 240 hours).   Labs: BNP (last 3 results) No results for input(s): BNP in the last 8760 hours.  Basic Metabolic Panel: Recent Labs  Lab 11/25/23 1817  NA 139  K 3.3*  CL 101  CO2 26  GLUCOSE 75  BUN 29*  CREATININE 1.06*  CALCIUM 9.0   Liver Function Tests: Recent Labs  Lab 11/25/23 1817  AST 22  ALT 18  ALKPHOS 69  BILITOT 0.7  PROT 7.2  ALBUMIN 3.4*   No results for input(s): LIPASE, AMYLASE in the last 168 hours. No results for input(s): AMMONIA in the last 168 hours. CBC: Recent Labs  Lab 11/25/23 1817  WBC 5.0  NEUTROABS 2.3  HGB 10.4*  HCT 32.5*  MCV 75.9*  PLT 205   Cardiac Enzymes: No results for input(s): CKTOTAL, CKMB, CKMBINDEX, TROPONINI in the last 168 hours. BNP: Invalid input(s): POCBNP CBG: No results for input(s): GLUCAP in the last 168 hours. D-Dimer No results for input(s): DDIMER in the last 72 hours. Hgb A1c No results for input(s): HGBA1C in the last 72 hours. Lipid Profile Recent Labs    11/26/23 0513  CHOL 135  HDL 42  LDLCALC 82  TRIG 57  CHOLHDL 3.2    Thyroid  function studies Recent Labs    11/26/23 0205  TSH 0.764   Anemia work up No results for input(s): VITAMINB12, FOLATE, FERRITIN, TIBC, IRON, RETICCTPCT in the last 72 hours. Urinalysis    Component Value Date/Time   COLORURINE YELLOW (A) 11/26/2023 0345   APPEARANCEUR HAZY (A) 11/26/2023 0345   LABSPEC 1.016 11/26/2023 0345   PHURINE 7.0 11/26/2023 0345   GLUCOSEU NEGATIVE 11/26/2023 0345   HGBUR NEGATIVE 11/26/2023 0345   BILIRUBINUR NEGATIVE 11/26/2023 0345   KETONESUR NEGATIVE 11/26/2023 0345   PROTEINUR NEGATIVE 11/26/2023 0345   NITRITE NEGATIVE 11/26/2023 0345   LEUKOCYTESUR SMALL (A) 11/26/2023 0345   Sepsis Labs Recent Labs  Lab 11/25/23 1817  WBC 5.0   Microbiology No results found for this or any previous visit (from the past 240 hours). Imaging ECHOCARDIOGRAM COMPLETE Result Date: 11/26/2023    ECHOCARDIOGRAM REPORT   Patient Name:   CATHIE BONNELL Date of Exam: 11/26/2023 Medical Rec #:  [id number]          Height:       62.0 in Accession #:    [id number]         Weight:       170.0 lb Date of Birth:  1936-01-15          BSA:          1.784 m Patient Age:    88 years           BP:           194/88 mmHg Patient Gender: F                  HR:           85 bpm. Exam Location:  [facility] Procedure: 2D Echo, Cardiac Doppler and Color Doppler (Both Spectral and Color            Flow Doppler were utilized during procedure). Indications:     Stroke  History:         Patient has no prior history of Echocardiogram examinations.                  Risk Factors:Hypertension and Dyslipidemia.  Sonographer:     Philomena Daring Referring Phys:  [id number] DELAYNE LULLA SOLIAN Diagnosing Phys: Lonni Hanson MD IMPRESSIONS  1. Left ventricular ejection fraction, by estimation, is 60 to  65%. The left ventricle has normal function. The left ventricle has no regional wall motion abnormalities. There is mild left ventricular hypertrophy. Left ventricular diastolic parameters are  consistent with Grade I diastolic dysfunction (impaired relaxation).  2. Right ventricular systolic function is normal. The right ventricular size is normal. Mildly increased right ventricular wall thickness.  3. The mitral valve is grossly normal. Trivial mitral valve regurgitation.  4. The aortic valve is tricuspid. Aortic valve regurgitation is not visualized. No aortic stenosis is present.  5. The inferior vena cava is normal in size with <50% respiratory variability, suggesting right atrial pressure of 8 mmHg.  6. Agitated saline contrast bubble study was positive with shunting observed within 3-6 cardiac cycles suggestive of interatrial shunt. FINDINGS  Left Ventricle: Left ventricular ejection fraction, by estimation, is 60 to 65%. The left ventricle has normal function. The left ventricle has no regional wall motion abnormalities. The left ventricular internal cavity size was normal in size. There is  mild left ventricular hypertrophy. Left ventricular diastolic parameters are consistent with Grade I diastolic dysfunction (impaired relaxation). Right Ventricle: The right ventricular size is normal. Mildly increased right ventricular wall thickness. Right ventricular systolic function is normal. The tricuspid regurgitant velocity is 1.65 m/s, and with an assumed right atrial pressure of 8 mmHg, the estimated right ventricular systolic pressure is 18.9 mmHg. Left Atrium: Left atrial size was normal in size. Right Atrium: Right atrial size was normal in size. Pericardium: There is no evidence of pericardial effusion. Mitral Valve: The mitral valve is grossly normal. Trivial mitral valve regurgitation. Tricuspid Valve: The tricuspid valve is not well visualized. Tricuspid valve regurgitation is trivial. Aortic Valve: The aortic valve is tricuspid. Aortic valve regurgitation is not visualized. No aortic stenosis is present. Aortic valve mean gradient measures 3.4 mmHg. Aortic valve peak gradient measures 6.6  mmHg. Aortic valve area, by VTI measures 2.45 cm. Pulmonic Valve: The pulmonic valve was not well visualized. Pulmonic valve regurgitation is trivial. No evidence of pulmonic stenosis. Aorta: The aortic root and ascending aorta are structurally normal, with no evidence of dilitation. Pulmonary Artery: The pulmonary artery is of normal size. Venous: The inferior vena cava is normal in size with less than 50% respiratory variability, suggesting right atrial pressure of 8 mmHg. IAS/Shunts: The interatrial septum was not well visualized. Agitated saline contrast was given intravenously to evaluate for intracardiac shunting. Agitated saline contrast bubble study was positive with shunting observed within 3-6 cardiac cycles suggestive of interatrial shunt.  LEFT VENTRICLE PLAX 2D LVIDd:         4.50 cm   Diastology LVIDs:         3.00 cm   LV e' medial:    7.94 cm/s LV PW:         1.10 cm   LV E/e' medial:  8.3 LV IVS:        1.00 cm   LV e' lateral:   7.94 cm/s LVOT diam:     2.00 cm   LV E/e' lateral: 8.3 LV SV:         56 LV SV Index:   32 LVOT Area:     3.14 cm  RIGHT VENTRICLE             IVC RV S prime:     10.30 cm/s  IVC diam: 1.80 cm TAPSE (M-mode): 2.0 cm LEFT ATRIUM           Index        RIGHT  ATRIUM           Index LA diam:      3.70 cm 2.07 cm/m   RA Area:     10.10 cm LA Vol (A4C): 32.3 ml 18.10 ml/m  RA Volume:   15.30 ml  8.58 ml/m  AORTIC VALVE AV Area (Vmax):    2.33 cm AV Area (Vmean):   2.18 cm AV Area (VTI):     2.45 cm AV Vmax:           128.33 cm/s AV Vmean:          89.469 cm/s AV VTI:            0.229 m AV Peak Grad:      6.6 mmHg AV Mean Grad:      3.4 mmHg LVOT Vmax:         95.30 cm/s LVOT Vmean:        62.200 cm/s LVOT VTI:          0.179 m LVOT/AV VTI ratio: 0.78  AORTA Ao Root diam: 2.60 cm MITRAL VALVE                TRICUSPID VALVE MV Area (PHT): 4.29 cm     TR Peak grad:   10.9 mmHg MV Decel Time: 177 msec     TR Vmax:        165.00 cm/s MV E velocity: 66.10 cm/s MV A velocity:  108.00 cm/s  SHUNTS MV E/A ratio:  0.61         Systemic VTI:  0.18 m                             Systemic Diam: 2.00 cm Lonni Hanson MD Electronically signed by Lonni Hanson MD Signature Date/Time: 11/26/2023/5:50:29 PM    Final    MR BRAIN WO CONTRAST Result Date: 11/26/2023 EXAM: MRI BRAIN WITHOUT CONTRAST 11/26/2023 12:50:00 AM TECHNIQUE: Multiplanar multisequence MRI of the head/brain was performed without the administration of intravenous contrast. COMPARISON: None available. CLINICAL HISTORY: Neuro deficit, acute, stroke suspected. 88 y.o. female with a history of hypertension who presents after a mechanical fall. Patient reports she tripped over an object at home, fell and hit her head. She has no neck pain, no back pain, no chest pain, no abdominal pain no extremity injuries. FINDINGS: BRAIN AND VENTRICLES: No acute infarct. No mass. No midline shift. No hydrocephalus. Numerous chronic micro hemorrhages in a peripheral distribution most typical of cerebral amyloid angiopathy. Multifocal hyperintense T2-weighted signal within the cerebral white matter, most commonly due to chronic small vessel disease. The sella is unremarkable. Normal flow voids. ORBITS: No acute abnormality. SINUSES AND MASTOIDS: No acute abnormality. BONES AND SOFT TISSUES: Normal marrow signal. No acute soft tissue abnormality. IMPRESSION: 1. No acute intracranial abnormality. 2. Numerous chronic microhemorrhages in a peripheral distribution, most typical of cerebral amyloid angiopathy. 3. Multifocal hyperintense T2-weighted signal within the cerebral white matter, most commonly due to chronic small vessel disease. Electronically signed by: Franky Stanford MD 11/26/2023 01:13 AM EDT RP Workstation: [id number]      Time coordinating discharge: over 30 minutes  SIGNED:  Dorean Daniello DO [facility]

## 2023-11-27 NOTE — Plan of Care (Signed)
  Problem: Education: Goal: Knowledge of disease or condition will improve Outcome: Progressing Goal: Knowledge of secondary prevention will improve (MUST DOCUMENT ALL) Outcome: Progressing   Problem: Ischemic Stroke/TIA Tissue Perfusion: Goal: Complications of ischemic stroke/TIA will be minimized Outcome: Progressing   Problem: Coping: Goal: Will identify appropriate support needs Outcome: Progressing   Problem: Nutrition: Goal: Risk of aspiration will decrease Outcome: Progressing   Problem: Clinical Measurements: Goal: Will remain free from infection Outcome: Progressing

## 2023-11-27 NOTE — Plan of Care (Signed)

## 2023-11-27 NOTE — Progress Notes (Signed)
 Occupational Therapy Treatment Patient Details Name: Tammy Hendricks MRN: [id number] DOB: [birth year] Today's Date: 11/27/2023   History of present illness Pt is an 88 yo female that presented to the ED for vague symptoms including dizziness, weakness, vision changes, speech changes, and seen recently in ED for a fall. PMH of HTN.   OT comments  Pt seen for OT treatment on this date. Upon arrival to room pt sitting up in bed finishing up her lunch, agreeable to tx. Pt completed bed mobility with MODI and CGA for STS from the EOB with verbal/tactile cues for technique. Pt amb into BR with RW + CGA MINA for safety awareness with IV pole and DME management. Pt completed pericare in sitting/lateral leans with supervision. Slightly impulsive during session, with delayed response to questions. Pt retired in [employer] with all needs in reach. Pt making good progress toward goals, will continue to follow POC. Discharge recommendation remains appropriate.        If plan is discharge home, recommend the following:  A little help with walking and/or transfers;A little help with bathing/dressing/bathroom;Assistance with cooking/housework;Assist for transportation;Help with stairs or ramp for entrance;Supervision due to cognitive status;Direct supervision/assist for medications management;Direct supervision/assist for financial management   Equipment Recommendations  None recommended by OT    Recommendations for Other Services      Precautions / Restrictions Precautions Precautions: Fall Recall of Precautions/Restrictions: Impaired Restrictions Weight Bearing Restrictions Per Provider Order: No       Mobility Bed Mobility Overal bed mobility: Modified Independent             General bed mobility comments: No assistance to complete, verbal cues to initiate task    Transfers Overall transfer level: Needs assistance Equipment used: Rolling walker (2 wheels) Transfers: Sit to/from  Stand Sit to Stand: Contact guard assist           General transfer comment: Verbal cues for hand placement on RW and bedside for lift off     Balance Overall balance assessment: Needs assistance Sitting-balance support: Feet supported Sitting balance-Leahy Scale: Good     Standing balance support: Single extremity supported, Bilateral upper extremity supported, During functional activity, No upper extremity supported, Reliant on assistive device for balance Standing balance-Leahy Scale: Fair Standing balance comment: PRN use of RW                           ADL either performed or assessed with clinical judgement   ADL Overall ADL's : Needs assistance/impaired Eating/Feeding: Set up;Sitting   Grooming: Wash/dry hands;Sitting;Contact guard assist Grooming Details (indicate cue type and reason): Sink level             Lower Body Dressing: Maximal assistance Lower Body Dressing Details (indicate cue type and reason): Donning socks while seated on the EOB, pt declines attempting herself Toilet Transfer: Ambulation;Rolling walker (2 wheels);Grab bars;Regular Toilet;Contact guard assist           Functional mobility during ADLs: Contact guard assist;Rolling walker (2 wheels);Cueing for safety General ADL Comments: MAXA don bilateral socks prior to amb, CGA during toilet transfer with use of RW, supervision for pericare in sitting.     Communication Communication Communication: Impaired Factors Affecting Communication: Difficulty expressing self   Cognition Arousal: Alert Behavior During Therapy: WFL for tasks assessed/performed Cognition: No family/caregiver present to determine baseline             OT - Cognition Comments: Delayed responding, simple short  questions are best for pt to respone                 Following commands: Intact        Cueing   Cueing Techniques: Verbal cues  Exercises Exercises: Other exercises Other  Exercises Other Exercises: Edu: Benefits of sitting up in recliner and OOB mobility                 Pertinent Vitals/ Pain       Pain Assessment Pain Assessment: No/denies pain                                                          Frequency  Min 2X/week        Progress Toward Goals  OT Goals(current goals can now be found in the care plan section)  Progress towards OT goals: Progressing toward goals  Acute Rehab OT Goals OT Goal Formulation: With patient Time For Goal Achievement: 12/10/23 Potential to Achieve Goals: Good ADL Goals Pt Will Perform Lower Body Dressing: with supervision;sit to/from stand Pt Will Transfer to Toilet: ambulating;with supervision Pt Will Perform Toileting - Clothing Manipulation and hygiene: sit to/from stand;with supervision Additional ADL Goal #1: Pt will complete grooming/ADL tasks at the sink wiht supervision for safety, demonstrating good safety awareness.   AM-PAC OT 6 Clicks Daily Activity     Outcome Measure   Help from another person eating meals?: None Help from another person taking care of personal grooming?: None Help from another person toileting, which includes using toliet, bedpan, or urinal?: A Little Help from another person bathing (including washing, rinsing, drying)?: A Little Help from another person to put on and taking off regular upper body clothing?: A Little Help from another person to put on and taking off regular lower body clothing?: A Little 6 Click Score: 20    End of Session Equipment Utilized During Treatment: Rolling walker (2 wheels)  OT Visit Diagnosis: Other abnormalities of gait and mobility (R26.89);Muscle weakness (generalized) (M62.81)   Activity Tolerance Patient tolerated treatment well   Patient Left in chair;with call bell/phone within reach;with chair alarm set   Nurse Communication Mobility status        Time: 8651-8597 OT Time Calculation (min): 14  min  Charges: OT General Charges $OT Visit: 1 Visit OT Treatments $Self Care/Home Management : 8-22 mins  Larraine Colas M.S. OTR/L  11/27/23, 2:55 PM

## 2023-11-27 NOTE — Progress Notes (Signed)
 PROGRESS NOTE    Tammy Hendricks   FMW:[id number] DOB: 1935/11/22  DOA: 11/25/2023 Date of Service: 11/27/23 which is hospital day 0  PCP: Fernand Fredy RAMAN, MD    Hospital course / significant events:   HPI: Tammy Hendricks is a 88 y.o. female with medical history significant of hypertension presents to the emergency department 08/04 from home with her daughter for evaluation of weakness x 1 day. Of note had also been seen 08/02 in ED after fall at home w/ head trauma, no LOC. Was reportedly back to her baseline, until yesterday when patient felt funny.  She was unable to specify what fell off.  Her daughter present at bedside states her speech was a little slow but no facial droop or slurred speech was noticed. Daughter also notes increase in falls recently, was seen on 7/18 by PCP for evaluation of dizziness and right leg weakness which had resolved.   08/05: hypertensive up to 203/80.  MRI without acute intracranial abnormality, however numerous chronic microhemorrhages and chronic small vessel disease was noted.  Patient denies urinary complaints, UA with esterase and many bacteria. Admitted to hospitalist w/ neurology consult. MRI brain neg for acute CVA, did show concern for cerebral amyloid angiopathy and chronic small vessel disease. Echo concern for small ASD but no clots 08/06: CTA H/N pending. Per cardiology, ASA sufficient and can follow outpatient re: echo findings. I reviewed hospital course and findings / plan with patient and with her daughter over the phone. If CTA H/N normal can discharge - may need to wait until report is back tomorrow      Consultants:  Neurology  Informal discussion w/ cardiology but no formal consult   Procedures/Surgeries: none      ASSESSMENT & PLAN:   Altered mental status, resolved. Likely d/t hypertensive urgency Multifocal hyperintense T2-weighted signal within the cerebral white matter on MRI brain, clinically significant and most  commonly due to chronic small vessel disease.  Chronic microhemorrhages in a peripheral distribution on MRI brain, clinically significant and most typical of cerebral amyloid angiopathy.  without delirium/encephalopathy and without acute stroke per neurology on this hospitalization  CTA head/neck pending Continue ASA BP management per standard protocol. Goal of 120/80. Of note, amyloid angiopathy and HTN are both risk factors for possible development of gross intracranial hemorrhage.  Outpatient follow up with neurology for dementia evaluation.   TTE bubble study was positive with shunting observed within 3-6 cardiac cycles suggestive of interatrial shunt.  Outpatient cardiology follow up  Aspirin     HTN  Question hypertensive urgency contributing to AMS Goals BP 120/80 resume home antihypertensives hydrochlorothiazide  and added losartan  Close outpatient follow up .    Class 1 obesity based on BMI: Body mass index is 31.09 kg/m.SABRA Significantly low or high BMI is associated with higher medical risk.  Underweight - under 18  overweight - 25 to 29 obese - 30 or more Class 1 obesity: BMI of 30.0 to 34 Class 2 obesity: BMI of 35.0 to 39 Class 3 obesity: BMI of 40.0 to 49 Super Morbid Obesity: BMI 50-59 Super-super Morbid Obesity: BMI 60+ Healthy nutrition and physical activity advised as adjunct to other disease management and risk reduction treatments    DVT prophylaxis: lovenox  IV fluids: no continuous IV fluids  Nutrition: regular diet  Central lines / other devices: none  Code Status: FULL CODE ACP documentation reviewed:  none on file in [facility]  [facility] needs: Home health Medical barriers to dispo: CTA H/N results.  Expected medical readiness for discharge once those are back / if normal can discharge and if abnormal will need vascular surgery or neurosurgery consult .              Subjective / Brief ROS:  Patient reports feeling fine today Denies CP/SOB.  Pain  controlled.  Denies new weakness.  Tolerating diet.  Reports no concerns w/ urination/defecation.   Family Communication: sopke on phone w/ daughter     Objective Findings:  Vitals:   11/27/23 0437 11/27/23 0728 11/27/23 1150 11/27/23 1611  BP: (!) 165/87 (!) 144/96 (!) 146/80 (!) 180/79  Pulse: 99 100 89 80  Resp:  18 17   Temp: (!) 97.4 F (36.3 C) 98.1 F (36.7 C) 97.8 F (36.6 C) 98.1 F (36.7 C)  TempSrc: Oral     SpO2: 95% 95% 96% 98%  Weight:      Height:        Intake/Output Summary (Last 24 hours) at 11/27/2023 1716 Last data filed at 11/26/2023 2035 Gross per 24 hour  Intake 120 ml  Output --  Net 120 ml   Filed Weights   11/25/23 1813 11/26/23 0629  Weight: 79 kg 77.1 kg    Examination:  Physical Exam Constitutional:      General: She is not in acute distress. Cardiovascular:     Rate and Rhythm: Normal rate and regular rhythm.  Pulmonary:     Effort: Pulmonary effort is normal.     Breath sounds: Normal breath sounds.  Abdominal:     Palpations: Abdomen is soft.  Skin:    General: Skin is warm and dry.  Neurological:     General: No focal deficit present.     Mental Status: She is alert and oriented to person, place, and time. Mental status is at baseline.  Psychiatric:        Mood and Affect: Mood normal.        Behavior: Behavior normal.          Scheduled Medications:   aspirin  EC  81 mg Oral Daily   enoxaparin  (LOVENOX ) injection  40 mg Subcutaneous Q24H   hydrochlorothiazide   25 mg Oral Daily   [START ON 11/28/2023] losartan   25 mg Oral Daily   pantoprazole   40 mg Oral Daily   potassium chloride  SA  20 mEq Oral Daily   senna-docusate  1 tablet Oral Daily    Continuous Infusions:  cefTRIAXone  (ROCEPHIN )  IV 1 g (11/27/23 1348)    PRN Medications:  acetaminophen  **OR** acetaminophen  (TYLENOL ) oral liquid 160 mg/5 mL **OR** acetaminophen   Antimicrobials from admission:  Anti-infectives (From admission, onward)    Start      Dose/Rate Route Frequency Ordered Stop   11/26/23 1200  cefTRIAXone  (ROCEPHIN ) 1 g in sodium chloride  0.9 % 100 mL IVPB        1 g 200 mL/hr over 30 Minutes Intravenous Every 24 hours 11/26/23 1020             Data Reviewed:  I have personally reviewed the following...  CBC: Recent Labs  Lab 11/25/23 1817  WBC 5.0  NEUTROABS 2.3  HGB 10.4*  HCT 32.5*  MCV 75.9*  PLT 205   Basic Metabolic Panel: Recent Labs  Lab 11/25/23 1817  NA 139  K 3.3*  CL 101  CO2 26  GLUCOSE 75  BUN 29*  CREATININE 1.06*  CALCIUM 9.0   GFR: Estimated Creatinine Clearance: 36.6 mL/min (A) (by C-G formula based on SCr  of 1.06 mg/dL (H)). Liver Function Tests: Recent Labs  Lab 11/25/23 1817  AST 22  ALT 18  ALKPHOS 69  BILITOT 0.7  PROT 7.2  ALBUMIN 3.4*   No results for input(s): LIPASE, AMYLASE in the last 168 hours. No results for input(s): AMMONIA in the last 168 hours. Coagulation Profile: Recent Labs  Lab 11/25/23 1817  INR 1.1   Cardiac Enzymes: No results for input(s): CKTOTAL, CKMB, CKMBINDEX, TROPONINI in the last 168 hours. BNP (last 3 results) No results for input(s): PROBNP in the last 8760 hours. HbA1C: No results for input(s): HGBA1C in the last 72 hours. CBG: No results for input(s): GLUCAP in the last 168 hours. Lipid Profile: Recent Labs    11/26/23 0513  CHOL 135  HDL 42  LDLCALC 82  TRIG 57  CHOLHDL 3.2   Thyroid  Function Tests: Recent Labs    11/26/23 0205  TSH 0.764   Anemia Panel: No results for input(s): VITAMINB12, FOLATE, FERRITIN, TIBC, IRON, RETICCTPCT in the last 72 hours. Most Recent Urinalysis On File:     Component Value Date/Time   COLORURINE YELLOW (A) 11/26/2023 0345   APPEARANCEUR HAZY (A) 11/26/2023 0345   LABSPEC 1.016 11/26/2023 0345   PHURINE 7.0 11/26/2023 0345   GLUCOSEU NEGATIVE 11/26/2023 0345   HGBUR NEGATIVE 11/26/2023 0345   BILIRUBINUR NEGATIVE 11/26/2023 0345   KETONESUR  NEGATIVE 11/26/2023 0345   PROTEINUR NEGATIVE 11/26/2023 0345   NITRITE NEGATIVE 11/26/2023 0345   LEUKOCYTESUR SMALL (A) 11/26/2023 0345   Sepsis Labs: @LABRCNTIP (procalcitonin:4,lacticidven:4) Microbiology: No results found for this or any previous visit (from the past 240 hours).    Radiology Studies last 3 days: ECHOCARDIOGRAM COMPLETE Result Date: 11/26/2023    ECHOCARDIOGRAM REPORT   Patient Name:   Tammy Hendricks Date of Exam: 11/26/2023 Medical Rec #:  [id number]          Height:       62.0 in Accession #:    [id number]         Weight:       170.0 lb Date of Birth:  07-06-1935          BSA:          1.784 m Patient Age:    88 years           BP:           194/88 mmHg Patient Gender: F                  HR:           85 bpm. Exam Location:  [facility] Procedure: 2D Echo, Cardiac Doppler and Color Doppler (Both Spectral and Color            Flow Doppler were utilized during procedure). Indications:     Stroke  History:         Patient has no prior history of Echocardiogram examinations.                  Risk Factors:Hypertension and Dyslipidemia.  Sonographer:     Philomena Daring Referring Phys:  [id number] DELAYNE LULLA SOLIAN Diagnosing Phys: Lonni Hanson MD IMPRESSIONS  1. Left ventricular ejection fraction, by estimation, is 60 to 65%. The left ventricle has normal function. The left ventricle has no regional wall motion abnormalities. There is mild left ventricular hypertrophy. Left ventricular diastolic parameters are consistent with Grade I diastolic dysfunction (impaired relaxation).  2. Right ventricular systolic function is normal. The right ventricular  size is normal. Mildly increased right ventricular wall thickness.  3. The mitral valve is grossly normal. Trivial mitral valve regurgitation.  4. The aortic valve is tricuspid. Aortic valve regurgitation is not visualized. No aortic stenosis is present.  5. The inferior vena cava is normal in size with <50% respiratory variability, suggesting right  atrial pressure of 8 mmHg.  6. Agitated saline contrast bubble study was positive with shunting observed within 3-6 cardiac cycles suggestive of interatrial shunt. FINDINGS  Left Ventricle: Left ventricular ejection fraction, by estimation, is 60 to 65%. The left ventricle has normal function. The left ventricle has no regional wall motion abnormalities. The left ventricular internal cavity size was normal in size. There is  mild left ventricular hypertrophy. Left ventricular diastolic parameters are consistent with Grade I diastolic dysfunction (impaired relaxation). Right Ventricle: The right ventricular size is normal. Mildly increased right ventricular wall thickness. Right ventricular systolic function is normal. The tricuspid regurgitant velocity is 1.65 m/s, and with an assumed right atrial pressure of 8 mmHg, the estimated right ventricular systolic pressure is 18.9 mmHg. Left Atrium: Left atrial size was normal in size. Right Atrium: Right atrial size was normal in size. Pericardium: There is no evidence of pericardial effusion. Mitral Valve: The mitral valve is grossly normal. Trivial mitral valve regurgitation. Tricuspid Valve: The tricuspid valve is not well visualized. Tricuspid valve regurgitation is trivial. Aortic Valve: The aortic valve is tricuspid. Aortic valve regurgitation is not visualized. No aortic stenosis is present. Aortic valve mean gradient measures 3.4 mmHg. Aortic valve peak gradient measures 6.6 mmHg. Aortic valve area, by VTI measures 2.45 cm. Pulmonic Valve: The pulmonic valve was not well visualized. Pulmonic valve regurgitation is trivial. No evidence of pulmonic stenosis. Aorta: The aortic root and ascending aorta are structurally normal, with no evidence of dilitation. Pulmonary Artery: The pulmonary artery is of normal size. Venous: The inferior vena cava is normal in size with less than 50% respiratory variability, suggesting right atrial pressure of 8 mmHg. IAS/Shunts: The  interatrial septum was not well visualized. Agitated saline contrast was given intravenously to evaluate for intracardiac shunting. Agitated saline contrast bubble study was positive with shunting observed within 3-6 cardiac cycles suggestive of interatrial shunt.  LEFT VENTRICLE PLAX 2D LVIDd:         4.50 cm   Diastology LVIDs:         3.00 cm   LV e' medial:    7.94 cm/s LV PW:         1.10 cm   LV E/e' medial:  8.3 LV IVS:        1.00 cm   LV e' lateral:   7.94 cm/s LVOT diam:     2.00 cm   LV E/e' lateral: 8.3 LV SV:         56 LV SV Index:   32 LVOT Area:     3.14 cm  RIGHT VENTRICLE             IVC RV S prime:     10.30 cm/s  IVC diam: 1.80 cm TAPSE (M-mode): 2.0 cm LEFT ATRIUM           Index        RIGHT ATRIUM           Index LA diam:      3.70 cm 2.07 cm/m   RA Area:     10.10 cm LA Vol (A4C): 32.3 ml 18.10 ml/m  RA Volume:   15.30  ml  8.58 ml/m  AORTIC VALVE AV Area (Vmax):    2.33 cm AV Area (Vmean):   2.18 cm AV Area (VTI):     2.45 cm AV Vmax:           128.33 cm/s AV Vmean:          89.469 cm/s AV VTI:            0.229 m AV Peak Grad:      6.6 mmHg AV Mean Grad:      3.4 mmHg LVOT Vmax:         95.30 cm/s LVOT Vmean:        62.200 cm/s LVOT VTI:          0.179 m LVOT/AV VTI ratio: 0.78  AORTA Ao Root diam: 2.60 cm MITRAL VALVE                TRICUSPID VALVE MV Area (PHT): 4.29 cm     TR Peak grad:   10.9 mmHg MV Decel Time: 177 msec     TR Vmax:        165.00 cm/s MV E velocity: 66.10 cm/s MV A velocity: 108.00 cm/s  SHUNTS MV E/A ratio:  0.61         Systemic VTI:  0.18 m                             Systemic Diam: 2.00 cm Lonni Hanson MD Electronically signed by Lonni Hanson MD Signature Date/Time: 11/26/2023/5:50:29 PM    Final    MR BRAIN WO CONTRAST Result Date: 11/26/2023 EXAM: MRI BRAIN WITHOUT CONTRAST 11/26/2023 12:50:00 AM TECHNIQUE: Multiplanar multisequence MRI of the head/brain was performed without the administration of intravenous contrast. COMPARISON: None available.  CLINICAL HISTORY: Neuro deficit, acute, stroke suspected. 88 y.o. female with a history of hypertension who presents after a mechanical fall. Patient reports she tripped over an object at home, fell and hit her head. She has no neck pain, no back pain, no chest pain, no abdominal pain no extremity injuries. FINDINGS: BRAIN AND VENTRICLES: No acute infarct. No mass. No midline shift. No hydrocephalus. Numerous chronic micro hemorrhages in a peripheral distribution most typical of cerebral amyloid angiopathy. Multifocal hyperintense T2-weighted signal within the cerebral white matter, most commonly due to chronic small vessel disease. The sella is unremarkable. Normal flow voids. ORBITS: No acute abnormality. SINUSES AND MASTOIDS: No acute abnormality. BONES AND SOFT TISSUES: Normal marrow signal. No acute soft tissue abnormality. IMPRESSION: 1. No acute intracranial abnormality. 2. Numerous chronic microhemorrhages in a peripheral distribution, most typical of cerebral amyloid angiopathy. 3. Multifocal hyperintense T2-weighted signal within the cerebral white matter, most commonly due to chronic small vessel disease. Electronically signed by: Franky Stanford MD 11/26/2023 01:13 AM EDT RP Workstation: [id number]   CT HEAD WO CONTRAST Result Date: 11/25/2023 EXAM: CT HEAD WITHOUT 11/25/2023 06:39:20 PM TECHNIQUE: CT of the head was performed without the administration of intravenous contrast. Automated exposure control, iterative reconstruction, and/or weight based adjustment of the mA/kV was utilized to reduce the radiation dose to as low as reasonably achievable. COMPARISON: 11/24/23 CLINICAL HISTORY: Neuro deficit, acute, stroke suspected. Patient to ED via POV for left leg weakness and slow speech per daughter, BARI 0030 when patient went to sleep. No slurred speech noted or aphasia. Pt able to hold up left leg for 10 sec without dropping but states it feels weak in comparison to right. Daughter  reports fall on 8/2 and  was seen in ED for same. FINDINGS: BRAIN AND VENTRICLES: No acute intracranial hemorrhage. No mass effect or midline shift. No extra-axial fluid collection. Gray-white differentiation is maintained. No hydrocephalus. Chronic microvascular ischemia and generalized atrophy. ORBITS: No acute abnormality. SINUSES AND MASTOIDS: No acute abnormality. SOFT TISSUES AND SKULL: No acute skull fracture. No acute soft tissue abnormality. IMPRESSION: 1. No acute intracranial abnormality. Electronically signed by: Norman Gatlin MD 11/25/2023 07:36 PM EDT RP Workstation: [id number]   CT Head Wo Contrast Result Date: 11/24/2023 EXAM: CT HEAD WITHOUT 11/24/2023 12:21:14 AM TECHNIQUE: CT of the head was performed without the administration of intravenous contrast. Automated exposure control, iterative reconstruction, and/or weight based adjustment of the mA/kV was utilized to reduce the radiation dose to as low as reasonably achievable. COMPARISON: None available. CLINICAL HISTORY: Head trauma, minor (Age >= 65y). Triage note: Pt to ed from home via [facility] for a mechanical fall. Pt tripped over some shoes and fell and struck the left side of her head. Denies LOC. Denies blood thinners. Requested transport. No pertinent medical HX. Per EMS she had some runs of trigeminy so they obtained an ECG which shows 1st degree block. FINDINGS: BRAIN AND VENTRICLES: No acute intracranial hemorrhage. No mass effect or midline shift. No extra-axial fluid collection. Gray-white differentiation is maintained. No hydrocephalus. Mild chronic ischemic white matter changes. ORBITS: No acute abnormality. SINUSES AND MASTOIDS: No acute abnormality. SOFT TISSUES AND SKULL: No acute skull fracture. No acute soft tissue abnormality. IMPRESSION: 1. No acute intracranial abnormality related to the reported head trauma. 2. Mild chronic ischemic white matter changes. Electronically signed by: Franky Stanford MD 11/24/2023 12:29 AM EDT RP Workstation: [id number]            Laneta Blunt, DO [facility] 11/27/2023, 5:16 PM    Dictation software may have been used to generate the above note. Typos may occur and escape review in typed/dictated notes. Please contact Dr Blunt directly for clarity if needed.  Staff may message me via secure chat in Epic  but this may not receive an immediate response,  please page me for urgent matters!  If 7PM-7AM, please contact night coverage [email or web address]

## 2023-11-28 ENCOUNTER — Observation Stay

## 2023-11-28 DIAGNOSIS — G9589 Other specified diseases of spinal cord: Secondary | ICD-10-CM | POA: Diagnosis not present

## 2023-11-28 DIAGNOSIS — G959 Disease of spinal cord, unspecified: Secondary | ICD-10-CM | POA: Diagnosis not present

## 2023-11-28 DIAGNOSIS — M4802 Spinal stenosis, cervical region: Secondary | ICD-10-CM | POA: Diagnosis not present

## 2023-11-28 DIAGNOSIS — M5021 Other cervical disc displacement,  high cervical region: Secondary | ICD-10-CM | POA: Diagnosis not present

## 2023-11-28 DIAGNOSIS — R29818 Other symptoms and signs involving the nervous system: Secondary | ICD-10-CM | POA: Diagnosis not present

## 2023-11-28 DIAGNOSIS — M47812 Spondylosis without myelopathy or radiculopathy, cervical region: Secondary | ICD-10-CM | POA: Diagnosis not present

## 2023-11-28 LAB — URINE CULTURE: Culture: >=100000 — AB

## 2023-11-28 NOTE — Plan of Care (Signed)
  Problem: Ischemic Stroke/TIA Tissue Perfusion: Goal: Complications of ischemic stroke/TIA will be minimized Outcome: Progressing   Problem: Coping: Goal: Will verbalize positive feelings about self Outcome: Progressing Goal: Will identify appropriate support needs Outcome: Progressing   Problem: Health Behavior/Discharge Planning: Goal: Ability to manage health-related needs will improve Outcome: Progressing   Problem: Self-Care: Goal: Ability to participate in self-care as condition permits will improve Outcome: Progressing Goal: Verbalization of feelings and concerns over difficulty with self-care will improve Outcome: Progressing Goal: Ability to communicate needs accurately will improve Outcome: Progressing   Problem: Nutrition: Goal: Risk of aspiration will decrease Outcome: Progressing Goal: Dietary intake will improve Outcome: Progressing   Problem: Clinical Measurements: Goal: Cardiovascular complication will be avoided Outcome: Progressing   Problem: Activity: Goal: Risk for activity intolerance will decrease Outcome: Progressing   Problem: Elimination: Goal: Will not experience complications related to bowel motility Outcome: Progressing Goal: Will not experience complications related to urinary retention Outcome: Progressing   Problem: Safety: Goal: Ability to remain free from injury will improve Outcome: Progressing

## 2023-11-28 NOTE — TOC Progression Note (Signed)
 Transition of Care [facility]) - Progression Note    Patient Details  Name: WARRENE KAPFER MRN: [id number] Date of Birth: [birth year]  [facility] of [facility]) CM/SW Contact  Dalia GORMAN Fuse, RN Phone Number: 11/28/2023, 10:55 AM  Clinical Narrative:     Patient remains inpatient. 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 Radiology recommending MRI of spine to evaluate for possible cord compression. Plan is for the patient to return to home w/ [facility] PT/OT and DME RW. TOC outreached to the Hospitalitis to request orders for DME and HH.  TOC will continue to follow.                     Expected Discharge Plan and Services                                               Social Drivers of Health (SDOH) Interventions SDOH Screenings   Food Insecurity: No Food Insecurity (11/26/2023)  Housing: Low Risk  (11/26/2023)  Transportation Needs: No Transportation Needs (11/26/2023)  Utilities: Not At Risk (11/26/2023)  Alcohol Screen: Low Risk  (04/02/2023)  Depression (PHQ2-9): Low Risk  (04/02/2023)  Financial Resource Strain: Low Risk  (04/02/2023)  Social Connections: Unknown (11/26/2023)  Tobacco Use: Low Risk  (11/25/2023)    Readmission Risk Interventions     No data to display

## 2023-11-28 NOTE — Plan of Care (Addendum)
 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   This is most likely the etiology for her presenting complaints of BLE weakness.   Neurosurgery has been consulted for possible spinal decompression procedure.   Electronically signed: Dr. Akeila Lana

## 2023-11-28 NOTE — Consult Note (Signed)
 Consult requested by:  Dr. Marsa   Consult requested for:  Cervical stenosis  Primary Physician:  Fernand Fredy RAMAN, MD  History of Present Illness: 11/28/2023 Ms. Daney Moor is here today with a chief complaint of difficulty walking.  She has had to use a walker most recently and has had some falls.  She denies dropping items.  She denies changes in her sensation or dexterity.  She was brought in for evaluation of possible TIA.  She was severely hypertensive when she arrived.  She is now doing better.  She was not felt to have a TIA.  I was consulted for management recommendations.   TRISTIN GLADMAN has symptoms of cervical myelopathy.  The symptoms are causing a significant impact on the patient's life.   I have utilized the care everywhere function in epic to review the outside records available from external health systems.  Review of Systems:  A 10 point review of systems is negative, except for the pertinent positives and negatives detailed in the HPI.  Past Medical History: Past Medical History:  Diagnosis Date   Arthritis    right knee   Hypertension    Pre-diabetes    Sciatica    left side - occasional   Vertigo    sporadic   Wears dentures    partial upper, full lower    Past Surgical History: Past Surgical History:  Procedure Laterality Date   ABDOMINAL HYSTERECTOMY     APPENDECTOMY     CATARACT EXTRACTION W/PHACO Right 11/09/2015   Procedure: CATARACT EXTRACTION PHACO AND INTRAOCULAR LENS PLACEMENT (IOC) right eye;  Surgeon: Dene Etienne, MD;  Location: [facility];  Service: Ophthalmology;  Laterality: Right;   CATARACT EXTRACTION W/PHACO Left 12/03/2017   Procedure: CATARACT EXTRACTION PHACO AND INTRAOCULAR LENS PLACEMENT (IOC) LEFT EYE;  Surgeon: Etienne Dene, MD;  Location: [facility];  Service: Ophthalmology;  Laterality: Left;   CHOLECYSTECTOMY     COLONOSCOPY     OOPHORECTOMY     TONSILLECTOMY       Allergies: Allergies as of 11/25/2023   (No Known Allergies)    Medications: Current Meds  Medication Sig   losartan  (COZAAR ) 25 MG tablet Take 1 tablet (25 mg total) by mouth daily.    Social History: Social History   Tobacco Use   Smoking status: Never   Smokeless tobacco: Never  Vaping Use   Vaping status: Never Used  Substance Use Topics   Alcohol use: No    Family Medical History: Family History  Problem Relation Age of Onset   Dementia Mother    Hypertension Father     Physical Examination: Vitals:   11/28/23 0737 11/28/23 1555  BP: (!) 149/76 (!) 165/93  Pulse: 92 97  Resp: 17 16  Temp: 98.4 F (36.9 C) 98.1 F (36.7 C)  SpO2: 97% 98%    General: Patient is in no apparent distress. Attention to examination is appropriate.  Neck:   Supple.  Full range of motion.  Respiratory: Patient is breathing without any difficulty.   NEUROLOGICAL:     Awake, alert, oriented to person, place, and time.  Speech is clear and fluent.  Cranial Nerves: Pupils equal round and reactive to light.  Facial tone is symmetric.  Facial sensation is symmetric. Shoulder shrug is symmetric. Tongue protrusion is midline.  There is no pronator drift.  Strength: Side Biceps Triceps Deltoid Interossei Grip Wrist Ext. Wrist Flex.  R 5 5 5  4+ 4+ 5 5  L 5 5 5 5 5 5 5    Side Iliopsoas Quads Hamstring PF DF EHL  R 5 5 5 5 5 5   L 4+ 5 5 5 5 5    Reflexes are 2+ and symmetric at the biceps, triceps, brachioradialis, patella and achilles.   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   Bilateral upper and lower extremity sensation is intact to light touch.    No evidence of dysmetria noted.  Gait is untested but she now requires a walker.     Medical Decision Making  Imaging: MRI C spine 11/28/2023 IMPRESSION: 1. 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 Severe bilateral neural foraminal narrowing at this level. 2. Moderate spinal canal stenosis at C3-4  with severe right and moderate left neural foraminal narrowing. 3. Moderate spinal canal stenosis at C5-6 with severe left and moderate right neural foraminal narrowing.   Electronically signed by: Ryan Chess MD 11/28/2023 03:07 PM EDT RP Workstation: [id number]  I have personally reviewed the images and agree with the above interpretation.  Assessment and Plan: Ms. Friebel is a pleasant 88 y.o. female with symptoms of cervical myelopathy.  She has had increasing trouble with walking and balance.  Her cervical spine MRI scan shows severe stenosis of C3-C5 and moderate stenosis at C5-6 with myelomalacia.  We discussed that she likely has cervical myelopathy.  There is no conservative management for myelopathy.  We discussed the possibility of cervical decompression with a laminoplasty.  She would like to discuss this with her family.  If she is safe for discharge, I am happy to follow her up as an outpatient to discuss in the clinic.    I have communicated my recommendations to the requesting physician and coordinated care to facilitate these recommendations.     Jazalyn Mondor K. Clois MD, [facility]

## 2023-11-28 NOTE — Progress Notes (Signed)
 PROGRESS NOTE    Tammy Hendricks   FMW:[id number] DOB: 20-Jul-1935  DOA: 11/25/2023 Date of Service: 11/28/23 which is hospital day 0  PCP: Fernand Fredy RAMAN, MD    Hospital course / significant events:   HPI: Tammy Hendricks is a 88 y.o. female with medical history significant of hypertension presents to the emergency department 08/04 from home with her daughter for evaluation of weakness x 1 day. Of note had also been seen 08/02 in ED after fall at home w/ head trauma, no LOC. Was reportedly back to her baseline, until yesterday when patient felt funny.  She was unable to specify what fell off.  Her daughter present at bedside states her speech was a little slow but no facial droop or slurred speech was noticed. Daughter also notes increase in falls recently, was seen on 7/18 by PCP for evaluation of dizziness and right leg weakness which had resolved.   08/05: hypertensive up to 203/80.  MRI without acute intracranial abnormality, however numerous chronic microhemorrhages and chronic small vessel disease was noted.  Patient denies urinary complaints, UA with esterase and many bacteria. Admitted to hospitalist w/ neurology consult. MRI brain neg for acute CVA, did show concern for cerebral amyloid angiopathy and chronic small vessel disease. Echo concern for small ASD but no clots 08/06: CTA H/N pending. Per cardiology, ASA sufficient and can follow outpatient re: echo findings.  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 Per neuro recs: no statin due to possibly increased risk of ICH with statins in patients with CAA (cerebral amyloid angiopathy), can continue ASA monotherapy. Incidental findings of degenerative changes in C-spine concerning for canal stenosis, MRI C-spine confirms severe stenosis and neurosurgery consulted as neurology believes stenosis explains her presenting symptoms       Consultants:  Neurology  Informal discussion w/ cardiology but no  formal consult Neurosurgery   Procedures/Surgeries: none      ASSESSMENT & PLAN:   Altered mental status, resolved. Likely d/t hypertensive urgency Multifocal hyperintense T2-weighted signal within the cerebral white matter on MRI brain, clinically significant and most commonly due to chronic small vessel disease.  Chronic microhemorrhages in a peripheral distribution on MRI brain, clinically significant and most typical of cerebral amyloid angiopathy.  CTA H/N (+)moderate basilar artery stenosis  without delirium/encephalopathy and without acute stroke per neurology on this hospitalization  Manage medically  Continue ASA Per neuro recs: no statin due to possibly increased risk of ICH with statins in patients with CAA (cerebral amyloid angiopathy), can continue ASA monotherapy BP management per standard protocol. Goal of 120/80. Of note, amyloid angiopathy and HTN are both risk factors for possible development of gross intracranial hemorrhage.  Outpatient follow up with neurology for dementia evaluation.   CTA of head and neck incidental findings uncertain clinical significance, (+)degenerative changes in C-spine concerning for canal stenosis MRI C-spine confirms severe stenosis, appreciate neurosurgery recs   TTE bubble study was positive with shunting observed within 3-6 cardiac cycles suggestive of interatrial shunt.  Outpatient cardiology follow up  Aspirin     HTN  Question hypertensive urgency contributing to AMS Goals BP 120/80 resume home antihypertensives hydrochlorothiazide  and added losartan  Close outpatient follow up .    Class 1 obesity based on BMI: Body mass index is 31.09 kg/m.SABRA Significantly low or high BMI is associated with higher medical risk.  Underweight - under 18  overweight - 25 to 29 obese - 30 or more Class 1 obesity: BMI of 30.0  to 34 Class 2 obesity: BMI of 35.0 to 39 Class 3 obesity: BMI of 40.0 to 49 Super Morbid Obesity: BMI  50-59 Super-super Morbid Obesity: BMI 60+ Healthy nutrition and physical activity advised as adjunct to other disease management and risk reduction treatments    DVT prophylaxis: lovenox  IV fluids: no continuous IV fluids  Nutrition: regular diet  Central lines / other devices: none  Code Status: FULL CODE ACP documentation reviewed:  none on file in [facility]  [facility] needs: Home health Medical barriers to dispo: neurosurgery likely to recommend for surgical intervention on Cspine stenosis. Expected medical readiness for discharge pending surgery               Subjective / Brief ROS:  Patient reports feeling fine today Denies CP/SOB.  Pain controlled.  Denies new weakness.  Tolerating diet.  Reports no concerns w/ urination/defecation.   Family Communication: son is at bedside on rounds this afternoon when I went in to review MRI results and inform pt to anticipate neurosurgery to see her later today     Objective Findings:  Vitals:   11/28/23 0051 11/28/23 0345 11/28/23 0737 11/28/23 1555  BP: (!) 164/80 (!) 159/82 (!) 149/76 (!) 165/93  Pulse: 89 95 92 97  Resp: 16 16 17 16   Temp: 98.7 F (37.1 C) 98.3 F (36.8 C) 98.4 F (36.9 C) 98.1 F (36.7 C)  TempSrc:  Oral    SpO2: 100% 97% 97% 98%  Weight:      Height:        Intake/Output Summary (Last 24 hours) at 11/28/2023 1713 Last data filed at 11/28/2023 1300 Gross per 24 hour  Intake 0 ml  Output --  Net 0 ml   Filed Weights   11/25/23 1813 11/26/23 0629  Weight: 79 kg 77.1 kg    Examination:  Physical Exam Constitutional:      General: She is not in acute distress. Cardiovascular:     Rate and Rhythm: Normal rate and regular rhythm.  Pulmonary:     Effort: Pulmonary effort is normal.     Breath sounds: Normal breath sounds.  Abdominal:     Palpations: Abdomen is soft.  Skin:    General: Skin is warm and dry.  Neurological:     Mental Status: She is alert and oriented to person, place, and  time. Mental status is at baseline.     Comments: Still has some weakness LLE and LUE compared to R side   Psychiatric:        Mood and Affect: Mood normal.        Behavior: Behavior normal.          Scheduled Medications:   aspirin  EC  81 mg Oral Daily   enoxaparin  (LOVENOX ) injection  40 mg Subcutaneous Q24H   hydrochlorothiazide   25 mg Oral Daily   losartan   25 mg Oral Daily   pantoprazole   40 mg Oral Daily   potassium chloride  SA  20 mEq Oral Daily   senna-docusate  1 tablet Oral Daily    Continuous Infusions:    PRN Medications:  acetaminophen  **OR** acetaminophen  (TYLENOL ) oral liquid 160 mg/5 mL **OR** acetaminophen   Antimicrobials from admission:  Anti-infectives (From admission, onward)    Start     Dose/Rate Route Frequency Ordered Stop   11/26/23 1200  cefTRIAXone  (ROCEPHIN ) 1 g in sodium chloride  0.9 % 100 mL IVPB  Status:  Discontinued        1 g 200 mL/hr over 30  Minutes Intravenous Every 24 hours 11/26/23 1020 11/28/23 0745           Data Reviewed:  I have personally reviewed the following...  CBC: Recent Labs  Lab 11/25/23 1817  WBC 5.0  NEUTROABS 2.3  HGB 10.4*  HCT 32.5*  MCV 75.9*  PLT 205   Basic Metabolic Panel: Recent Labs  Lab 11/25/23 1817  NA 139  K 3.3*  CL 101  CO2 26  GLUCOSE 75  BUN 29*  CREATININE 1.06*  CALCIUM 9.0   GFR: Estimated Creatinine Clearance: 36.6 mL/min (A) (by C-G formula based on SCr of 1.06 mg/dL (H)). Liver Function Tests: Recent Labs  Lab 11/25/23 1817  AST 22  ALT 18  ALKPHOS 69  BILITOT 0.7  PROT 7.2  ALBUMIN 3.4*   No results for input(s): LIPASE, AMYLASE in the last 168 hours. No results for input(s): AMMONIA in the last 168 hours. Coagulation Profile: Recent Labs  Lab 11/25/23 1817  INR 1.1   Cardiac Enzymes: No results for input(s): CKTOTAL, CKMB, CKMBINDEX, TROPONINI in the last 168 hours. BNP (last 3 results) No results for input(s): PROBNP in the  last 8760 hours. HbA1C: No results for input(s): HGBA1C in the last 72 hours. CBG: No results for input(s): GLUCAP in the last 168 hours. Lipid Profile: Recent Labs    11/26/23 0513  CHOL 135  HDL 42  LDLCALC 82  TRIG 57  CHOLHDL 3.2   Thyroid  Function Tests: Recent Labs    11/26/23 0205  TSH 0.764   Anemia Panel: No results for input(s): VITAMINB12, FOLATE, FERRITIN, TIBC, IRON, RETICCTPCT in the last 72 hours. Most Recent Urinalysis On File:     Component Value Date/Time   COLORURINE YELLOW (A) 11/26/2023 0345   APPEARANCEUR HAZY (A) 11/26/2023 0345   LABSPEC 1.016 11/26/2023 0345   PHURINE 7.0 11/26/2023 0345   GLUCOSEU NEGATIVE 11/26/2023 0345   HGBUR NEGATIVE 11/26/2023 0345   BILIRUBINUR NEGATIVE 11/26/2023 0345   KETONESUR NEGATIVE 11/26/2023 0345   PROTEINUR NEGATIVE 11/26/2023 0345   NITRITE NEGATIVE 11/26/2023 0345   LEUKOCYTESUR SMALL (A) 11/26/2023 0345   Sepsis Labs: @LABRCNTIP (procalcitonin:4,lacticidven:4) Microbiology: Recent Results (from the past 240 hours)  Urine Culture (for pregnant, neutropenic or urologic patients or patients with an indwelling urinary catheter)     Status: Abnormal   Collection Time: 11/26/23  2:05 AM   Specimen: Urine, Clean Catch  Result Value Ref Range Status   Specimen Description   Final    URINE, CLEAN CATCH Performed at [facility], [address] [place], [place] [address]    Special Requests   Final    NONE Performed at [facility], [address] [place], [place] [address]    Culture >=100,000 COLONIES/mL KLEBSIELLA PNEUMONIAE (A)  Final   Report Status 11/28/2023 FINAL  Final   Organism ID, Bacteria KLEBSIELLA PNEUMONIAE (A)  Final      Susceptibility   Klebsiella pneumoniae - MIC*    AMPICILLIN RESISTANT Resistant     CEFAZOLIN <=4 SENSITIVE Sensitive     CEFEPIME <=0.12 SENSITIVE Sensitive     CEFTRIAXONE  <=0.25 SENSITIVE Sensitive     CIPROFLOXACIN <=0.25  SENSITIVE Sensitive     GENTAMICIN <=1 SENSITIVE Sensitive     IMIPENEM <=0.25 SENSITIVE Sensitive     NITROFURANTOIN 64 INTERMEDIATE Intermediate     TRIMETH/SULFA <=20 SENSITIVE Sensitive     AMPICILLIN/SULBACTAM 4 SENSITIVE Sensitive     PIP/TAZO <=4 SENSITIVE Sensitive ug/mL    * >=100,000  COLONIES/mL KLEBSIELLA PNEUMONIAE      Radiology Studies last 3 days: MR CERVICAL SPINE WO CONTRAST Result Date: 11/28/2023 EXAM: MRI CERVICAL SPINE WITHOUT CONTRAST 11/28/2023 02:10:11 PM TECHNIQUE: Multiplanar multisequence MRI of the cervical spine was performed without the administration of intravenous contrast. COMPARISON: CTA head and neck dated 11/27/2023. CLINICAL HISTORY: Ataxia, nontraumatic, cervical pathology suspected; cervical spinal stenosis on CT. FINDINGS: BONES AND ALIGNMENT: Normal alignment. Normal vertebral body heights. Multilevel degenerative endplate marrow signal changes. SPINAL CORD: T2 hyperintensity and volume loss within the spinal cord centered around the C4-5 level, favored to reflect myelomalacia. SOFT TISSUES: No paraspinal mass. C2-C3: Moderate right-sided facet arthropathy. C3-C4: Severe disc height loss, disc bulge and ligamentum flavum buckling contribute to moderate spinal canal stenosis, severe right and moderate left neural foraminal narrowing. C4-C5: Central disc osteophyte complex with ligamentum flavum buckling result in severe spinal canal stenosis with associated myelomalacia. Facet arthropathy and uncovertebral joint spurring contribute to severe bilateral neural foraminal narrowing. C5-C6: Disc bulge results in moderate spinal canal stenosis. Left greater than right facet arthropathy and uncovertebral joint spurring result in severe left and moderate right neural foraminal narrowing. C6-C7: Disc bulge results in no more than mild spinal canal stenosis. No significant neural foraminal narrowing. Mild bilateral facet arthropathy. C7-T1: Small right central disc  protrusion. IMPRESSION: 1. 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 Severe bilateral neural foraminal narrowing at this level. 2. Moderate spinal canal stenosis at C3-4 with severe right and moderate left neural foraminal narrowing. 3. Moderate spinal canal stenosis at C5-6 with severe left and moderate right neural foraminal narrowing. Electronically signed by: Ryan Chess MD 11/28/2023 03:07 PM EDT RP Workstation: [id number]   CT ANGIO HEAD NECK W WO CM Result Date: 11/27/2023 CLINICAL DATA:  Neuro deficit, acute, stroke suspected EXAM: CT ANGIOGRAPHY HEAD AND NECK WITH AND WITHOUT CONTRAST TECHNIQUE: Multidetector CT imaging of the head and neck was performed using the standard protocol during bolus administration of intravenous contrast. Multiplanar CT image reconstructions and MIPs were obtained to evaluate the vascular anatomy. Carotid stenosis measurements (when applicable) are obtained utilizing NASCET criteria, using the distal internal carotid diameter as the denominator. RADIATION DOSE REDUCTION: This exam was performed according to the departmental dose-optimization program which includes automated exposure control, adjustment of the mA and/or kV according to patient size and/or use of iterative reconstruction technique. CONTRAST:  75mL OMNIPAQUE  IOHEXOL  350 MG/ML SOLN COMPARISON:  None Available. FINDINGS: CT HEAD FINDINGS Brain: No evidence of acute infarction, hemorrhage, hydrocephalus, extra-axial collection or mass lesion/mass effect. Patchy white matter hypodensities, nonspecific but compatible with chronic microvascular ischemic disease. Vascular: See below. Skull: No acute fracture. 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 Otherwise, clear sinuses. Other: No mastoid effusions. Review of the MIP images confirms the above findings CTA NECK FINDINGS Motion limited study. Aortic arch: Great vessel origins are patent  without significant stenosis. Aortic atherosclerosis. Right carotid system: No evidence of dissection, stenosis (50% or greater), or occlusion. Left carotid system: No evidence of dissection, stenosis (50% or greater), or occlusion. Vertebral arteries: Codominant. No evidence of dissection, stenosis (50% or greater), or occlusion. Skeleton: Multilevel degenerative change including posterior disc osteophyte complex at C4-C5 which results in at least moderate and potentially severe canal stenosis. Other neck: No evidence of acute abnormality on limited assessment. Upper chest: Visualized lung apices are clear. Review of the MIP images confirms the above findings CTA HEAD FINDINGS Anterior circulation: Bilateral intracranial ICAs, MCAs, and ACAs are  patent without proximal hemodynamically significant stenosis. No aneurysm identified. Posterior circulation: Bilateral intradural vertebral arteries are patent without significant stenosis. The basilar artery is patent with moderate stenosis along its midportion. The posterior cerebral arteries are patent without proximal hemodynamically significant stenosis. Venous sinuses: As permitted by contrast timing, patent. Review of the MIP images confirms the above findings IMPRESSION: 1. No emergent large vessel occlusion. 2. Moderate basilar artery stenosis. 3. At least moderate and potentially severe canal stenosis at C4-C5. MRI of the cervical spine could better assess if clinically warranted. Electronically Signed   By: Gilmore GORMAN Molt M.D.   On: 11/27/2023 23:11   ECHOCARDIOGRAM COMPLETE Result Date: 11/26/2023    ECHOCARDIOGRAM REPORT   Patient Name:   Tammy Hendricks Date of Exam: 11/26/2023 Medical Rec #:  [id number]          Height:       62.0 in Accession #:    [id number]         Weight:       170.0 lb Date of Birth:  1935-11-24          BSA:          1.784 m Patient Age:    88 years           BP:           194/88 mmHg Patient Gender: F                  HR:            85 bpm. Exam Location:  [facility] Procedure: 2D Echo, Cardiac Doppler and Color Doppler (Both Spectral and Color            Flow Doppler were utilized during procedure). Indications:     Stroke  History:         Patient has no prior history of Echocardiogram examinations.                  Risk Factors:Hypertension and Dyslipidemia.  Sonographer:     Philomena Daring Referring Phys:  [id number] DELAYNE LULLA SOLIAN Diagnosing Phys: Lonni Hanson MD IMPRESSIONS  1. Left ventricular ejection fraction, by estimation, is 60 to 65%. The left ventricle has normal function. The left ventricle has no regional wall motion abnormalities. There is mild left ventricular hypertrophy. Left ventricular diastolic parameters are consistent with Grade I diastolic dysfunction (impaired relaxation).  2. Right ventricular systolic function is normal. The right ventricular size is normal. Mildly increased right ventricular wall thickness.  3. The mitral valve is grossly normal. Trivial mitral valve regurgitation.  4. The aortic valve is tricuspid. Aortic valve regurgitation is not visualized. No aortic stenosis is present.  5. The inferior vena cava is normal in size with <50% respiratory variability, suggesting right atrial pressure of 8 mmHg.  6. Agitated saline contrast bubble study was positive with shunting observed within 3-6 cardiac cycles suggestive of interatrial shunt. FINDINGS  Left Ventricle: Left ventricular ejection fraction, by estimation, is 60 to 65%. The left ventricle has normal function. The left ventricle has no regional wall motion abnormalities. The left ventricular internal cavity size was normal in size. There is  mild left ventricular hypertrophy. Left ventricular diastolic parameters are consistent with Grade I diastolic dysfunction (impaired relaxation). Right Ventricle: The right ventricular size is normal. Mildly increased right ventricular wall thickness. Right ventricular systolic function is normal. The tricuspid  regurgitant velocity is 1.65 m/s, and with an assumed right atrial pressure of 8  mmHg, the estimated right ventricular systolic pressure is 18.9 mmHg. Left Atrium: Left atrial size was normal in size. Right Atrium: Right atrial size was normal in size. Pericardium: There is no evidence of pericardial effusion. Mitral Valve: The mitral valve is grossly normal. Trivial mitral valve regurgitation. Tricuspid Valve: The tricuspid valve is not well visualized. Tricuspid valve regurgitation is trivial. Aortic Valve: The aortic valve is tricuspid. Aortic valve regurgitation is not visualized. No aortic stenosis is present. Aortic valve mean gradient measures 3.4 mmHg. Aortic valve peak gradient measures 6.6 mmHg. Aortic valve area, by VTI measures 2.45 cm. Pulmonic Valve: The pulmonic valve was not well visualized. Pulmonic valve regurgitation is trivial. No evidence of pulmonic stenosis. Aorta: The aortic root and ascending aorta are structurally normal, with no evidence of dilitation. Pulmonary Artery: The pulmonary artery is of normal size. Venous: The inferior vena cava is normal in size with less than 50% respiratory variability, suggesting right atrial pressure of 8 mmHg. IAS/Shunts: The interatrial septum was not well visualized. Agitated saline contrast was given intravenously to evaluate for intracardiac shunting. Agitated saline contrast bubble study was positive with shunting observed within 3-6 cardiac cycles suggestive of interatrial shunt.  LEFT VENTRICLE PLAX 2D LVIDd:         4.50 cm   Diastology LVIDs:         3.00 cm   LV e' medial:    7.94 cm/s LV PW:         1.10 cm   LV E/e' medial:  8.3 LV IVS:        1.00 cm   LV e' lateral:   7.94 cm/s LVOT diam:     2.00 cm   LV E/e' lateral: 8.3 LV SV:         56 LV SV Index:   32 LVOT Area:     3.14 cm  RIGHT VENTRICLE             IVC RV S prime:     10.30 cm/s  IVC diam: 1.80 cm TAPSE (M-mode): 2.0 cm LEFT ATRIUM           Index        RIGHT ATRIUM            Index LA diam:      3.70 cm 2.07 cm/m   RA Area:     10.10 cm LA Vol (A4C): 32.3 ml 18.10 ml/m  RA Volume:   15.30 ml  8.58 ml/m  AORTIC VALVE AV Area (Vmax):    2.33 cm AV Area (Vmean):   2.18 cm AV Area (VTI):     2.45 cm AV Vmax:           128.33 cm/s AV Vmean:          89.469 cm/s AV VTI:            0.229 m AV Peak Grad:      6.6 mmHg AV Mean Grad:      3.4 mmHg LVOT Vmax:         95.30 cm/s LVOT Vmean:        62.200 cm/s LVOT VTI:          0.179 m LVOT/AV VTI ratio: 0.78  AORTA Ao Root diam: 2.60 cm MITRAL VALVE                TRICUSPID VALVE MV Area (PHT): 4.29 cm     TR Peak grad:   10.9 mmHg MV  Decel Time: 177 msec     TR Vmax:        165.00 cm/s MV E velocity: 66.10 cm/s MV A velocity: 108.00 cm/s  SHUNTS MV E/A ratio:  0.61         Systemic VTI:  0.18 m                             Systemic Diam: 2.00 cm Lonni Hanson MD Electronically signed by Lonni Hanson MD Signature Date/Time: 11/26/2023/5:50:29 PM    Final    MR BRAIN WO CONTRAST Result Date: 11/26/2023 EXAM: MRI BRAIN WITHOUT CONTRAST 11/26/2023 12:50:00 AM TECHNIQUE: Multiplanar multisequence MRI of the head/brain was performed without the administration of intravenous contrast. COMPARISON: None available. CLINICAL HISTORY: Neuro deficit, acute, stroke suspected. 88 y.o. female with a history of hypertension who presents after a mechanical fall. Patient reports she tripped over an object at home, fell and hit her head. She has no neck pain, no back pain, no chest pain, no abdominal pain no extremity injuries. FINDINGS: BRAIN AND VENTRICLES: No acute infarct. No mass. No midline shift. No hydrocephalus. Numerous chronic micro hemorrhages in a peripheral distribution most typical of cerebral amyloid angiopathy. Multifocal hyperintense T2-weighted signal within the cerebral white matter, most commonly due to chronic small vessel disease. The sella is unremarkable. Normal flow voids. ORBITS: No acute abnormality. SINUSES AND MASTOIDS: No  acute abnormality. BONES AND SOFT TISSUES: Normal marrow signal. No acute soft tissue abnormality. IMPRESSION: 1. No acute intracranial abnormality. 2. Numerous chronic microhemorrhages in a peripheral distribution, most typical of cerebral amyloid angiopathy. 3. Multifocal hyperintense T2-weighted signal within the cerebral white matter, most commonly due to chronic small vessel disease. Electronically signed by: Franky Stanford MD 11/26/2023 01:13 AM EDT RP Workstation: [id number]   CT HEAD WO CONTRAST Result Date: 11/25/2023 EXAM: CT HEAD WITHOUT 11/25/2023 06:39:20 PM TECHNIQUE: CT of the head was performed without the administration of intravenous contrast. Automated exposure control, iterative reconstruction, and/or weight based adjustment of the mA/kV was utilized to reduce the radiation dose to as low as reasonably achievable. COMPARISON: 11/24/23 CLINICAL HISTORY: Neuro deficit, acute, stroke suspected. Patient to ED via POV for left leg weakness and slow speech per daughter, BARI 0030 when patient went to sleep. No slurred speech noted or aphasia. Pt able to hold up left leg for 10 sec without dropping but states it feels weak in comparison to right. Daughter reports fall on 8/2 and was seen in ED for same. FINDINGS: BRAIN AND VENTRICLES: No acute intracranial hemorrhage. No mass effect or midline shift. No extra-axial fluid collection. Gray-white differentiation is maintained. No hydrocephalus. Chronic microvascular ischemia and generalized atrophy. ORBITS: No acute abnormality. SINUSES AND MASTOIDS: No acute abnormality. SOFT TISSUES AND SKULL: No acute skull fracture. No acute soft tissue abnormality. IMPRESSION: 1. No acute intracranial abnormality. Electronically signed by: Norman Gatlin MD 11/25/2023 07:36 PM EDT RP Workstation: [id number]           Laneta Blunt, DO [facility] 11/28/2023, 5:13 PM    Dictation software may have been used to generate the above note. Typos may  occur and escape review in typed/dictated notes. Please contact Dr Blunt directly for clarity if needed.  Staff may message me via secure chat in Epic  but this may not receive an immediate response,  please page me for urgent matters!  If 7PM-7AM, please contact night coverage [email or web address]

## 2023-11-28 NOTE — TOC Progression Note (Signed)
 Transition of Care [facility]) - Progression Note    Patient Details  Name: Tammy Hendricks MRN: [id number] Date of Birth: [birth year]  Transition of Care [facility] CM/SW Contact  Dalia GORMAN Fuse, RN Phone Number: 11/28/2023, 1:50 PM  Clinical Narrative:    Referral for [facility] PT/OT sent to [facility], Shaun with [facility] is verifying whether or not the payor is INN and will f/u with TOC. Referral for RW sent to [facility] with [employer], they will deliver the RW to the patient's room.                      Expected Discharge Plan and Services                                               Social Drivers of Health (SDOH) Interventions SDOH Screenings   Food Insecurity: No Food Insecurity (11/26/2023)  Housing: Low Risk  (11/26/2023)  Transportation Needs: No Transportation Needs (11/26/2023)  Utilities: Not At Risk (11/26/2023)  Alcohol Screen: Low Risk  (04/02/2023)  Depression (PHQ2-9): Low Risk  (04/02/2023)  Financial Resource Strain: Low Risk  (04/02/2023)  Social Connections: Unknown (11/26/2023)  Tobacco Use: Low Risk  (11/25/2023)    Readmission Risk Interventions     No data to display

## 2023-11-28 NOTE — Plan of Care (Signed)
  Problem: Education: Goal: Knowledge of disease or condition will improve Outcome: Progressing Goal: Knowledge of secondary prevention will improve (MUST DOCUMENT ALL) Outcome: Progressing Goal: Knowledge of patient specific risk factors will improve (DELETE if not current risk factor) Outcome: Progressing   Problem: Ischemic Stroke/TIA Tissue Perfusion: Goal: Complications of ischemic stroke/TIA will be minimized Outcome: Progressing   Problem: Coping: Goal: Will verbalize positive feelings about self Outcome: Progressing Goal: Will identify appropriate support needs Outcome: Progressing   Problem: Health Behavior/Discharge Planning: Goal: Ability to manage health-related needs will improve Outcome: Progressing Goal: Goals will be collaboratively established with patient/family Outcome: Progressing   Problem: Self-Care: Goal: Ability to participate in self-care as condition permits will improve Outcome: Progressing Goal: Verbalization of feelings and concerns over difficulty with self-care will improve Outcome: Progressing

## 2023-11-28 NOTE — Plan of Care (Signed)
 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 Would hold off on initiation of a statin due to possibly increased risk of ICH with statins in patients with CAA (cerebral amyloid angiopathy). Continue ASA monotherapy.   The incidentally imaged cervical spine on the CTA shows significant stenosis due to degenerative changes (personally reviewed). I agree with the radiology recommendation to obtain an MRI of her cervical spine to assess for possible cord compression.   Electronically signed: Dr. Krzysztof Reichelt

## 2023-11-28 NOTE — Progress Notes (Signed)
 Physical Therapy Treatment Patient Details Name: Tammy Hendricks MRN: [id number] DOB: 1935-11-28 Today's Date: 11/28/2023   History of Present Illness Pt is an 88 yo female that presented to the ED for vague symptoms including dizziness, weakness, vision changes, speech changes, and seen recently in ED for a fall. PMH of HTN.    PT Comments  Pt was asleep in recliner upon arrival. She easily awakes and agrees to session. Does have flat affect but able to follow commands throughout. She demonstrated safe abilities to stand and ambulate ~ 80 ft without LOB. Pt uses walking stick at baseline per pt but is encouraged to use RW at DC. DC recs remain appropriate to maximize her independence and safety with all ADLs.    If plan is discharge home, recommend the following: A little help with walking and/or transfers;A little help with bathing/dressing/bathroom     Equipment Recommendations  Rolling walker (2 wheels)       Precautions / Restrictions Precautions Precautions: Fall Recall of Precautions/Restrictions: Impaired Restrictions Weight Bearing Restrictions Per Provider Order: No     Mobility  Bed Mobility  General bed mobility comments: in recliner pre/post session    Transfers Overall transfer level: Needs assistance Equipment used: Rolling walker (2 wheels) Transfers: Sit to/from Stand Sit to Stand: Contact guard assist  General transfer comment: CGA for safety    Ambulation/Gait Ambulation/Gait assistance: Contact guard assist Gait Distance (Feet): 80 Feet Assistive device: Rolling walker (2 wheels) Gait Pattern/deviations: Step-through pattern Gait velocity: decreased  General Gait Details: Pt was easily and safely able to ambulate ~ 80 ft with RW. MRI pending however pt seemed to tolerate ambulation and OOB well. Vitals were stable throughout.   Balance Overall balance assessment: Needs assistance Sitting-balance support: Feet supported Sitting balance-Leahy Scale:  Good  Standing balance support: Bilateral upper extremity supported, During functional activity Standing balance-Leahy Scale: Good Standing balance comment: no LOB with use of RW.     Communication Communication Communication: Other (comment);No apparent difficulties (Just has flat affect) Factors Affecting Communication: Difficulty expressing self  Cognition Arousal: Alert Behavior During Therapy: Flat affect   PT - Cognitive impairments: No family/caregiver present to determine baseline      PT - Cognition Comments: Pt was asleep in  recliner upon arrival. Easy awakes and agrees to session. Remains with flat affect throughout session but able to perform all desired task Following commands: Intact      Cueing Cueing Techniques: Verbal cues         Pertinent Vitals/Pain Pain Assessment Pain Assessment: No/denies pain     PT Goals (current goals can now be found in the care plan section) Acute Rehab PT Goals Patient Stated Goal: none stated Progress towards PT goals: Progressing toward goals    Frequency    Min 2X/week       AM-PAC PT 6 Clicks Mobility   Outcome Measure  Help needed turning from your back to your side while in a flat bed without using bedrails?: None Help needed moving from lying on your back to sitting on the side of a flat bed without using bedrails?: None Help needed moving to and from a bed to a chair (including a wheelchair)?: None Help needed standing up from a chair using your arms (e.g., wheelchair or bedside chair)?: None Help needed to walk in hospital room?: A Little Help needed climbing 3-5 steps with a railing? : A Little 6 Click Score: 22    End of Session   Activity  Tolerance: Patient tolerated treatment well Patient left: in chair;with call bell/phone within reach;with chair alarm set Nurse Communication: Mobility status PT Visit Diagnosis: Other abnormalities of gait and mobility (R26.89);Difficulty in walking, not elsewhere  classified (R26.2);Muscle weakness (generalized) (M62.81)     Time: 8844-8787 PT Time Calculation (min) (ACUTE ONLY): 17 min  Charges:    $Gait Training: 8-22 mins PT General Charges $$ ACUTE PT VISIT: 1 Visit                    Rankin Essex PTA 11/28/23, 1:14 PM

## 2023-11-29 ENCOUNTER — Telehealth: Payer: Self-pay | Admitting: Neurosurgery

## 2023-11-29 DIAGNOSIS — R29818 Other symptoms and signs involving the nervous system: Secondary | ICD-10-CM | POA: Diagnosis not present

## 2023-11-29 LAB — CBC
HCT: 34.6 % — ABNORMAL LOW (ref 36.0–46.0)
Hemoglobin: 11.2 g/dL — ABNORMAL LOW (ref 12.0–15.0)
MCH: 24.4 pg — ABNORMAL LOW (ref 26.0–34.0)
MCHC: 32.4 g/dL (ref 30.0–36.0)
MCV: 75.4 fL — ABNORMAL LOW (ref 80.0–100.0)
Platelets: 202 K/uL (ref 150–400)
RBC: 4.59 MIL/uL (ref 3.87–5.11)
RDW: 14.1 % (ref 11.5–15.5)
WBC: 4.9 K/uL (ref 4.0–10.5)
nRBC: 0 % (ref 0.0–0.2)

## 2023-11-29 LAB — BASIC METABOLIC PANEL WITH GFR
Anion gap: 10 (ref 5–15)
BUN: 16 mg/dL (ref 8–23)
CO2: 28 mmol/L (ref 22–32)
Calcium: 9.3 mg/dL (ref 8.9–10.3)
Chloride: 103 mmol/L (ref 98–111)
Creatinine, Ser: 0.86 mg/dL (ref 0.44–1.00)
GFR, Estimated: >60 mL/min (ref >60)
Glucose, Bld: 100 mg/dL — ABNORMAL HIGH (ref 70–99)
Potassium: 3.2 mmol/L — ABNORMAL LOW (ref 3.5–5.1)
Sodium: 141 mmol/L (ref 135–145)

## 2023-11-29 NOTE — Telephone Encounter (Signed)
 He said a return appointment with him in about 2 weeks. Thanks

## 2023-11-29 NOTE — Telephone Encounter (Signed)
 Patient is being discharged and needs a follow up for cervical myelopathy. Please advise how soon she should be seen?

## 2023-11-29 NOTE — Discharge Summary (Signed)
 Physician Discharge Summary   Patient: Tammy Hendricks MRN: [id number]  DOB: December 30, 1935   Admit:     Date of Admission: 11/25/2023 Admitted from: home   Discharge: Date of discharge: 11/29/23 Disposition: Home Condition at discharge: good  CODE STATUS: FULL CODE     Discharge Physician: Laneta Blunt, DO [facility]     PCP: Fernand Fredy RAMAN, MD  Recommendations for Outpatient Follow-up:  Follow up with PCP Fernand Fredy RAMAN, MD in 1-2 weeks Follow up with neurosurgery to discuss C-spine intervention, stenosis was most likely reason for presenting symptoms / weakness as well as HTN Follow up with cardiology re: small atrial septal defect incidental finding on echocardiogram  Follow up with neurology for dementia evaluation    Discharge Instructions     Diet - low sodium heart healthy   Complete by: As directed    Discharge instructions   Complete by: As directed    [email or web address]   Increase activity slowly   Complete by: As directed          Discharge Diagnoses: Principal Problem:   Subacute neurologic deficit Active Problems:   Essential hypertension, benign   Benign paroxysmal 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 female with medical history significant of hypertension presents to the emergency department 08/04 from home with her daughter for evaluation of weakness x 1 day. Of note had also been seen 08/02 in ED after fall at home w/ head trauma, no LOC. Was reportedly back to her baseline, until yesterday when patient felt funny.  She was unable to specify what fell off.  Her daughter present at bedside states her speech was a little slow but no facial droop or slurred speech was noticed. Daughter  also notes increase in falls recently, was seen on 7/18 by PCP for evaluation of dizziness and right leg weakness which had resolved.   08/05: hypertensive up to 203/80.  MRI without acute intracranial abnormality, however numerous chronic microhemorrhages and chronic small vessel disease was noted.  Patient denies urinary complaints, UA with esterase and many bacteria. Admitted to hospitalist w/ neurology consult. MRI brain neg for acute CVA, did show concern for cerebral amyloid angiopathy and chronic small vessel disease. Echo concern for small ASD but no clots 08/06: CTA H/N pending. Per cardiology, ASA sufficient and can follow outpatient re: echo findings.  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 Per neuro recs: no statin due to possibly increased risk of ICH with statins in patients with CAA (cerebral amyloid angiopathy), can continue ASA monotherapy. Incidental findings of degenerative changes in C-spine concerning for canal stenosis, MRI C-spine confirms severe stenosis and neurosurgery consulted as neurology believes stenosis explains her presenting symptoms. Surgical intervention offered but pt would like to defer and follow outpatient  08/08: still sure of no surgery, stable for discharge home      Consultants:  Neurology  Informal discussion w/ cardiology but no formal consult Neurosurgery   Procedures/Surgeries: none      ASSESSMENT & PLAN:   #Altered mental status, resolved. Likely d/t hypertensive urgency. Weakness likely d/t effects cervical spinal stenosis  #Multifocal hyperintense T2-weighted signal within the cerebral white matter on MRI brain, clinically significant and most commonly due to chronic small vessel disease.  #  Chronic microhemorrhages in a peripheral distribution on MRI brain, clinically significant and most typical of cerebral amyloid angiopathy.  #CTA H/N (+)moderate basilar artery stenosis  #Cervical spinal stenosis with  myelopathy without delirium/encephalopathy and without acute stroke per neurology on this hospitalization  Manage medically  Continue ASA Per neuro recs: no statin due to possibly increased risk of ICH with statins in patients with CAA (cerebral amyloid angiopathy), can continue ASA monotherapy BP management per standard protocol. Goal of 120/80. Of note, amyloid angiopathy and HTN are both risk factors for possible development of gross intracranial hemorrhage.  Outpatient follow up with neurology for dementia evaluation.   #Severe Cervical spinal stenosis Likely explains presenting weakness  Outpatient follow up with neurosurgery   #TTE bubble study was positive with shunting observed within 3-6 cardiac cycles suggestive of interatrial shunt.  Outpatient cardiology follow up  Aspirin     #HTN  #Question hypertensive urgency contributing to AMS Goals BP 120/80 resume home antihypertensives hydrochlorothiazide  and added losartan  Close outpatient follow up .    #Class 1 obesity based on BMI: Body mass index is 31.09 kg/m.SABRA Significantly low or high BMI is associated with higher medical risk.  Underweight - under 18  overweight - 25 to 29 obese - 30 or more Class 1 obesity: BMI of 30.0 to 34 Class 2 obesity: BMI of 35.0 to 39 Class 3 obesity: BMI of 40.0 to 49 Super Morbid Obesity: BMI 50-59 Super-super Morbid Obesity: BMI 60+ Healthy nutrition and physical activity advised as adjunct to other disease management and risk reduction treatments             Discharge Instructions  Allergies as of 11/29/2023   No Known Allergies      Medication List     STOP taking these medications    benzonatate  100 MG capsule Commonly known as: Tessalon  Perles       TAKE these medications    acetaminophen  650 MG CR tablet Commonly known as: TYLENOL  Take 650 mg by mouth every 8 (eight) hours as needed for pain.   aspirin  EC 81 MG tablet Take 81 mg by mouth daily. Swallow  whole.   brimonidine  0.2 % ophthalmic solution Commonly known as: ALPHAGAN  Place 1 drop into both eyes 2 (two) times daily.   fluticasone  50 MCG/ACT nasal spray Commonly known as: FLONASE  Place 1 spray into both nostrils daily.   hydrochlorothiazide  25 MG tablet Commonly known as: HYDRODIURIL  TAKE 1 TABLET BY MOUTH ONCE  DAILY   Krill Oil 500 MG Caps Take 1 capsule by mouth daily.   latanoprost 0.005 % ophthalmic solution Commonly known as: XALATAN Place 1 drop into both eyes at bedtime.   loratadine 10 MG tablet Commonly known as: CLARITIN Take 10 mg by mouth daily.   losartan  25 MG tablet Commonly known as: Cozaar  Take 1 tablet (25 mg total) by mouth daily.   meloxicam  15 MG tablet Commonly known as: MOBIC  Take 1 tablet (15 mg total) by mouth daily.   pantoprazole  40 MG tablet Commonly known as: Protonix  Take 1 tablet (40 mg total) by mouth daily.   potassium chloride  SA 20 MEQ tablet Commonly known as: KLOR-CON  M Take 1 tablet (20 mEq total) by mouth daily.   senna-docusate 8.6-50 MG tablet Commonly known as: Senokot-S Take 1 tablet by mouth daily.   timolol  0.5 % ophthalmic solution Commonly known as: TIMOPTIC                Durable Medical Equipment  (From admission, onward)  Start     Ordered   11/28/23 1127  For home use only DME Walker rolling  Once       Question Answer Comment  Walker: With 5 Inch Wheels   Patient needs a walker to treat with the following condition Debility      11/28/23 1127             Follow-up Information     Fernand Fredy RAMAN, MD Follow up.   Specialty: Internal Medicine Why: hospital follow up Contact information: [address] [place] [address] [phone]         Lane Arthea BRAVO, MD. Schedule an appointment as soon as possible for a visit in 1 month(s).   Specialty: Neurology Why: Dr Lane or one of his colleagues - hospital follow up and outpatietn dementia  evaluation Contact information: [address] [place] [facility] [place] [address] [phone]         Perla Evalene PARAS, MD. Schedule an appointment as soon as possible for a visit.   Specialty: Cardiology Why: hosptial follow up - abnormal echocardiogram Contact information: [address] [place] [address] [phone]         [facility] Follow up.   Why: [employer] will deliver the Rolling Walker to the patient's room. Contact information: [address] [place] [address] [phone]         [facility]  Follow up.   Why: The agency will call to schedule the first PT/OT appointment. Contact information: [address] [place] [address] [phone]                 No Known Allergies   Subjective: pt feeling well this morning, ambulating steadily with walker, tolerating diet, no CP/SOB, no new weakness   Discharge Exam: BP (!) 116/53 (BP Location: Right Arm)   Pulse 88   Temp 98.5 F (36.9 C)   Resp 17   Ht 5' 2 (1.575 m)   Wt 77.1 kg   SpO2 98%   BMI 31.09 kg/m  General: Pt is alert, awake, not in acute distress Cardiovascular: RRR, S1/S2 +, no rubs, no gallops Respiratory: CTA bilaterally, no wheezing, no rhonchi Abdominal: Soft, NT, ND, bowel sounds + Extremities: no edema, no cyanosis     The results of significant diagnostics from this hospitalization (including imaging, microbiology, ancillary and laboratory) are listed below for reference.     Microbiology: Recent Results (from the past 240 hours)  Urine Culture (for pregnant, neutropenic or urologic patients or patients with an indwelling urinary catheter)     Status: Abnormal   Collection Time: 11/26/23  2:05 AM   Specimen: Urine, Clean Catch  Result Value Ref Range Status   Specimen Description   Final    URINE, CLEAN CATCH Performed at [facility], [address] [place], [place] [address]    Special Requests   Final    NONE Performed at [facility], [address] [place], [place] [address]    Culture >=100,000 COLONIES/mL KLEBSIELLA PNEUMONIAE (A)  Final   Report Status 11/28/2023 FINAL  Final   Organism ID, Bacteria KLEBSIELLA PNEUMONIAE (A)  Final      Susceptibility   Klebsiella pneumoniae - MIC*    AMPICILLIN RESISTANT Resistant     CEFAZOLIN <=4 SENSITIVE Sensitive     CEFEPIME <=0.12 SENSITIVE Sensitive     CEFTRIAXONE  <=0.25 SENSITIVE Sensitive  CIPROFLOXACIN <=0.25 SENSITIVE Sensitive     GENTAMICIN <=1 SENSITIVE Sensitive     IMIPENEM <=0.25 SENSITIVE Sensitive     NITROFURANTOIN 64 INTERMEDIATE Intermediate     TRIMETH/SULFA <=20 SENSITIVE Sensitive     AMPICILLIN/SULBACTAM 4 SENSITIVE Sensitive     PIP/TAZO <=4 SENSITIVE Sensitive ug/mL    * >=100,000 COLONIES/mL KLEBSIELLA PNEUMONIAE     Labs: BNP (last 3 results) No results for input(s): BNP in the last 8760 hours. Basic Metabolic Panel: Recent Labs  Lab 11/25/23 1817 11/29/23 0333  NA 139 141  K 3.3* 3.2*  CL 101 103  CO2 26 28  GLUCOSE 75 100*  BUN 29* 16  CREATININE 1.06* 0.86  CALCIUM 9.0 9.3   Liver Function Tests: Recent Labs  Lab 11/25/23 1817  AST 22  ALT 18  ALKPHOS 69  BILITOT 0.7  PROT 7.2  ALBUMIN 3.4*   No results for input(s): LIPASE, AMYLASE in the last 168 hours. No results for input(s): AMMONIA in the last 168 hours. CBC: Recent Labs  Lab 11/25/23 1817 11/29/23 0333  WBC 5.0 4.9  NEUTROABS 2.3  --   HGB 10.4* 11.2*  HCT 32.5* 34.6*  MCV 75.9* 75.4*  PLT 205 202   Cardiac Enzymes: No results for input(s): CKTOTAL, CKMB, CKMBINDEX, TROPONINI in the last 168 hours. BNP: Invalid input(s): POCBNP CBG: No results for input(s): GLUCAP in the last 168 hours. D-Dimer No results for input(s): DDIMER in the last 72 hours. Hgb A1c No results for input(s): HGBA1C in the last 72 hours. Lipid  Profile No results for input(s): CHOL, HDL, LDLCALC, TRIG, CHOLHDL, LDLDIRECT in the last 72 hours. Thyroid  function studies No results for input(s): TSH, T4TOTAL, T3FREE, THYROIDAB in the last 72 hours.  Invalid input(s): FREET3 Anemia work up No results for input(s): VITAMINB12, FOLATE, FERRITIN, TIBC, IRON, RETICCTPCT in the last 72 hours. Urinalysis    Component Value Date/Time   COLORURINE YELLOW (A) 11/26/2023 0345   APPEARANCEUR HAZY (A) 11/26/2023 0345   LABSPEC 1.016 11/26/2023 0345   PHURINE 7.0 11/26/2023 0345   GLUCOSEU NEGATIVE 11/26/2023 0345   HGBUR NEGATIVE 11/26/2023 0345   BILIRUBINUR NEGATIVE 11/26/2023 0345   KETONESUR NEGATIVE 11/26/2023 0345   PROTEINUR NEGATIVE 11/26/2023 0345   NITRITE NEGATIVE 11/26/2023 0345   LEUKOCYTESUR SMALL (A) 11/26/2023 0345   Sepsis Labs Recent Labs  Lab 11/25/23 1817 11/29/23 0333  WBC 5.0 4.9   Microbiology Recent Results (from the past 240 hours)  Urine Culture (for pregnant, neutropenic or urologic patients or patients with an indwelling urinary catheter)     Status: Abnormal   Collection Time: 11/26/23  2:05 AM   Specimen: Urine, Clean Catch  Result Value Ref Range Status   Specimen Description   Final    URINE, CLEAN CATCH Performed at [facility], [address] [place], [place] [address]    Special Requests   Final    NONE Performed at [facility], [address], [place], [place] [address]    Culture >=100,000 COLONIES/mL KLEBSIELLA PNEUMONIAE (A)  Final   Report Status 11/28/2023 FINAL  Final   Organism ID, Bacteria KLEBSIELLA PNEUMONIAE (A)  Final      Susceptibility   Klebsiella pneumoniae - MIC*    AMPICILLIN RESISTANT Resistant     CEFAZOLIN <=4 SENSITIVE Sensitive     CEFEPIME <=0.12 SENSITIVE Sensitive     CEFTRIAXONE  <=0.25 SENSITIVE Sensitive     CIPROFLOXACIN <=0.25 SENSITIVE Sensitive     GENTAMICIN <=1 SENSITIVE  Sensitive     IMIPENEM  <=0.25 SENSITIVE Sensitive     NITROFURANTOIN 64 INTERMEDIATE Intermediate     TRIMETH/SULFA <=20 SENSITIVE Sensitive     AMPICILLIN/SULBACTAM 4 SENSITIVE Sensitive     PIP/TAZO <=4 SENSITIVE Sensitive ug/mL    * >=100,000 COLONIES/mL KLEBSIELLA PNEUMONIAE   Imaging CT ANGIO HEAD NECK W WO CM Result Date: 11/27/2023 CLINICAL DATA:  Neuro deficit, acute, stroke suspected EXAM: CT ANGIOGRAPHY HEAD AND NECK WITH AND WITHOUT CONTRAST TECHNIQUE: Multidetector CT imaging of the head and neck was performed using the standard protocol during bolus administration of intravenous contrast. Multiplanar CT image reconstructions and MIPs were obtained to evaluate the vascular anatomy. Carotid stenosis measurements (when applicable) are obtained utilizing NASCET criteria, using the distal internal carotid diameter as the denominator. RADIATION DOSE REDUCTION: This exam was performed according to the departmental dose-optimization program which includes automated exposure control, adjustment of the mA and/or kV according to patient size and/or use of iterative reconstruction technique. CONTRAST:  75mL OMNIPAQUE  IOHEXOL  350 MG/ML SOLN COMPARISON:  None Available. FINDINGS: CT HEAD FINDINGS Brain: No evidence of acute infarction, hemorrhage, hydrocephalus, extra-axial collection or mass lesion/mass effect. Patchy white matter hypodensities, nonspecific but compatible with chronic microvascular ischemic disease. Vascular: See below. Skull: No acute fracture. 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 Otherwise, clear sinuses. Other: No mastoid effusions. Review of the MIP images confirms the above findings CTA NECK FINDINGS Motion limited study. Aortic arch: Great vessel origins are patent without significant stenosis. Aortic atherosclerosis. Right carotid system: No evidence of dissection, stenosis (50% or greater), or occlusion. Left carotid system: No evidence of dissection,  stenosis (50% or greater), or occlusion. Vertebral arteries: Codominant. No evidence of dissection, stenosis (50% or greater), or occlusion. Skeleton: Multilevel degenerative change including posterior disc osteophyte complex at C4-C5 which results in at least moderate and potentially severe canal stenosis. Other neck: No evidence of acute abnormality on limited assessment. Upper chest: Visualized lung apices are clear. Review of the MIP images confirms the above findings CTA HEAD FINDINGS Anterior circulation: Bilateral intracranial ICAs, MCAs, and ACAs are patent without proximal hemodynamically significant stenosis. No aneurysm identified. Posterior circulation: Bilateral intradural vertebral arteries are patent without significant stenosis. The basilar artery is patent with moderate stenosis along its midportion. The posterior cerebral arteries are patent without proximal hemodynamically significant stenosis. Venous sinuses: As permitted by contrast timing, patent. Review of the MIP images confirms the above findings IMPRESSION: 1. No emergent large vessel occlusion. 2. Moderate basilar artery stenosis. 3. At least moderate and potentially severe canal stenosis at C4-C5. MRI of the cervical spine could better assess if clinically warranted. Electronically Signed   By: Gilmore GORMAN Molt M.D.   On: 11/27/2023 23:11   ECHOCARDIOGRAM COMPLETE Result Date: 11/26/2023    ECHOCARDIOGRAM REPORT   Patient Name:   CHAI VERDEJO Date of Exam: 11/26/2023 Medical Rec #:  [id number]          Height:       62.0 in Accession #:    [id number]         Weight:       170.0 lb Date of Birth:  10-29-1935          BSA:          1.784 m Patient Age:    88 years           BP:           194/88 mmHg Patient Gender:  F                  HR:           85 bpm. Exam Location:  [facility] Procedure: 2D Echo, Cardiac Doppler and Color Doppler (Both Spectral and Color            Flow Doppler were utilized during procedure). Indications:     Stroke   History:         Patient has no prior history of Echocardiogram examinations.                  Risk Factors:Hypertension and Dyslipidemia.  Sonographer:     Philomena Daring Referring Phys:  [id number] DELAYNE LULLA SOLIAN Diagnosing Phys: Lonni Hanson MD IMPRESSIONS  1. Left ventricular ejection fraction, by estimation, is 60 to 65%. The left ventricle has normal function. The left ventricle has no regional wall motion abnormalities. There is mild left ventricular hypertrophy. Left ventricular diastolic parameters are consistent with Grade I diastolic dysfunction (impaired relaxation).  2. Right ventricular systolic function is normal. The right ventricular size is normal. Mildly increased right ventricular wall thickness.  3. The mitral valve is grossly normal. Trivial mitral valve regurgitation.  4. The aortic valve is tricuspid. Aortic valve regurgitation is not visualized. No aortic stenosis is present.  5. The inferior vena cava is normal in size with <50% respiratory variability, suggesting right atrial pressure of 8 mmHg.  6. Agitated saline contrast bubble study was positive with shunting observed within 3-6 cardiac cycles suggestive of interatrial shunt. FINDINGS  Left Ventricle: Left ventricular ejection fraction, by estimation, is 60 to 65%. The left ventricle has normal function. The left ventricle has no regional wall motion abnormalities. The left ventricular internal cavity size was normal in size. There is  mild left ventricular hypertrophy. Left ventricular diastolic parameters are consistent with Grade I diastolic dysfunction (impaired relaxation). Right Ventricle: The right ventricular size is normal. Mildly increased right ventricular wall thickness. Right ventricular systolic function is normal. The tricuspid regurgitant velocity is 1.65 m/s, and with an assumed right atrial pressure of 8 mmHg, the estimated right ventricular systolic pressure is 18.9 mmHg. Left Atrium: Left atrial size was normal in  size. Right Atrium: Right atrial size was normal in size. Pericardium: There is no evidence of pericardial effusion. Mitral Valve: The mitral valve is grossly normal. Trivial mitral valve regurgitation. Tricuspid Valve: The tricuspid valve is not well visualized. Tricuspid valve regurgitation is trivial. Aortic Valve: The aortic valve is tricuspid. Aortic valve regurgitation is not visualized. No aortic stenosis is present. Aortic valve mean gradient measures 3.4 mmHg. Aortic valve peak gradient measures 6.6 mmHg. Aortic valve area, by VTI measures 2.45 cm. Pulmonic Valve: The pulmonic valve was not well visualized. Pulmonic valve regurgitation is trivial. No evidence of pulmonic stenosis. Aorta: The aortic root and ascending aorta are structurally normal, with no evidence of dilitation. Pulmonary Artery: The pulmonary artery is of normal size. Venous: The inferior vena cava is normal in size with less than 50% respiratory variability, suggesting right atrial pressure of 8 mmHg. IAS/Shunts: The interatrial septum was not well visualized. Agitated saline contrast was given intravenously to evaluate for intracardiac shunting. Agitated saline contrast bubble study was positive with shunting observed within 3-6 cardiac cycles suggestive of interatrial shunt.  LEFT VENTRICLE PLAX 2D LVIDd:         4.50 cm   Diastology LVIDs:         3.00 cm  LV e' medial:    7.94 cm/s LV PW:         1.10 cm   LV E/e' medial:  8.3 LV IVS:        1.00 cm   LV e' lateral:   7.94 cm/s LVOT diam:     2.00 cm   LV E/e' lateral: 8.3 LV SV:         56 LV SV Index:   32 LVOT Area:     3.14 cm  RIGHT VENTRICLE             IVC RV S prime:     10.30 cm/s  IVC diam: 1.80 cm TAPSE (M-mode): 2.0 cm LEFT ATRIUM           Index        RIGHT ATRIUM           Index LA diam:      3.70 cm 2.07 cm/m   RA Area:     10.10 cm LA Vol (A4C): 32.3 ml 18.10 ml/m  RA Volume:   15.30 ml  8.58 ml/m  AORTIC VALVE AV Area (Vmax):    2.33 cm AV Area (Vmean):    2.18 cm AV Area (VTI):     2.45 cm AV Vmax:           128.33 cm/s AV Vmean:          89.469 cm/s AV VTI:            0.229 m AV Peak Grad:      6.6 mmHg AV Mean Grad:      3.4 mmHg LVOT Vmax:         95.30 cm/s LVOT Vmean:        62.200 cm/s LVOT VTI:          0.179 m LVOT/AV VTI ratio: 0.78  AORTA Ao Root diam: 2.60 cm MITRAL VALVE                TRICUSPID VALVE MV Area (PHT): 4.29 cm     TR Peak grad:   10.9 mmHg MV Decel Time: 177 msec     TR Vmax:        165.00 cm/s MV E velocity: 66.10 cm/s MV A velocity: 108.00 cm/s  SHUNTS MV E/A ratio:  0.61         Systemic VTI:  0.18 m                             Systemic Diam: 2.00 cm Lonni Hanson MD Electronically signed by Lonni Hanson MD Signature Date/Time: 11/26/2023/5:50:29 PM    Final    MR BRAIN WO CONTRAST Result Date: 11/26/2023 EXAM: MRI BRAIN WITHOUT CONTRAST 11/26/2023 12:50:00 AM TECHNIQUE: Multiplanar multisequence MRI of the head/brain was performed without the administration of intravenous contrast. COMPARISON: None available. CLINICAL HISTORY: Neuro deficit, acute, stroke suspected. 88 y.o. female with a history of hypertension who presents after a mechanical fall. Patient reports she tripped over an object at home, fell and hit her head. She has no neck pain, no back pain, no chest pain, no abdominal pain no extremity injuries. FINDINGS: BRAIN AND VENTRICLES: No acute infarct. No mass. No midline shift. No hydrocephalus. Numerous chronic micro hemorrhages in a peripheral distribution most typical of cerebral amyloid angiopathy. Multifocal hyperintense T2-weighted signal within the cerebral white matter, most commonly due to chronic small vessel disease. The sella is unremarkable. Normal  flow voids. ORBITS: No acute abnormality. SINUSES AND MASTOIDS: No acute abnormality. BONES AND SOFT TISSUES: Normal marrow signal. No acute soft tissue abnormality. IMPRESSION: 1. No acute intracranial abnormality. 2. Numerous chronic microhemorrhages in a  peripheral distribution, most typical of cerebral amyloid angiopathy. 3. Multifocal hyperintense T2-weighted signal within the cerebral white matter, most commonly due to chronic small vessel disease. Electronically signed by: Franky Stanford MD 11/26/2023 01:13 AM EDT RP Workstation: [id number]      Time coordinating discharge: over 30 minutes  SIGNED:  Scharlene Catalina DO [facility]

## 2023-11-29 NOTE — Telephone Encounter (Signed)
 Patient is scheduled on 12/19/23 @ 11:45.

## 2023-11-29 NOTE — Progress Notes (Signed)
 Physical Therapy Treatment Patient Details Name: Tammy Hendricks MRN: [id number] DOB: 1936-04-17 Today's Date: 11/29/2023   History of Present Illness Pt is an 88 yo female that presented to the ED for vague symptoms including dizziness, weakness, vision changes, speech changes, and seen recently in ED for a fall. PMH of HTN.    PT Comments  Pt was seated EOB upon arrival. She has DC orders in place. Pt demonstrated safe abilities to exit bed, stand, and ambulate without LOB or safety concern. Author called pt's family post session to discuss post acute care needs. She is cleared for safe DC home from an acute PT standpoint.     If plan is discharge home, recommend the following: A little help with walking and/or transfers;A little help with bathing/dressing/bathroom     Equipment Recommendations   (POt has personal DME in room already)       Precautions / Restrictions Precautions Precautions: Fall Recall of Precautions/Restrictions: Impaired Restrictions Weight Bearing Restrictions Per Provider Order: No     Mobility  Bed Mobility    General bed mobility comments: pt was seated EOB upon arrival. in recliner post session    Transfers Overall transfer level: Needs assistance Equipment used: Rolling walker (2 wheels) Transfers: Sit to/from Stand Sit to Stand: Supervision   Ambulation/Gait Ambulation/Gait assistance: Supervision Gait Distance (Feet): 120 Feet Assistive device: Rolling walker (2 wheels) Gait Pattern/deviations: Step-through pattern Gait velocity: decreased  General Gait Details: pt demonstrated safe steady gait kinematics without LOB or safety concern. did call pt's family post session.    Balance Overall balance assessment: Needs assistance Sitting-balance support: Feet supported Sitting balance-Leahy Scale: Good     Standing balance support: Bilateral upper extremity supported, During functional activity, Reliant on assistive device for  balance Standing balance-Leahy Scale: Good          Cognition Arousal: Alert Behavior During Therapy: WFL for tasks assessed/performed   PT - Cognitive impairments: No family/caregiver present to determine baseline    Following commands: Intact      Cueing Cueing Techniques: Verbal cues     General Comments General comments (skin integrity, edema, etc.): Called pt's family post session to discuss post acute care needs and expectations. Pt is to follow up with neurosurgery after DC      Pertinent Vitals/Pain Pain Assessment Pain Assessment: No/denies pain     PT Goals (current goals can now be found in the care plan section) Acute Rehab PT Goals Patient Stated Goal: none stated Progress towards PT goals: Progressing toward goals    Frequency    Min 2X/week       AM-PAC PT 6 Clicks Mobility   Outcome Measure  Help needed turning from your back to your side while in a flat bed without using bedrails?: None Help needed moving from lying on your back to sitting on the side of a flat bed without using bedrails?: None Help needed moving to and from a bed to a chair (including a wheelchair)?: None Help needed standing up from a chair using your arms (e.g., wheelchair or bedside chair)?: None Help needed to walk in hospital room?: A Little Help needed climbing 3-5 steps with a railing? : A Little 6 Click Score: 22    End of Session   Activity Tolerance: Patient tolerated treatment well Patient left: in chair;with call bell/phone within reach;with chair alarm set Nurse Communication: Mobility status PT Visit Diagnosis: Other abnormalities of gait and mobility (R26.89);Difficulty in walking, not elsewhere classified (R26.2);Muscle weakness (  generalized) (M62.81)     Time: 1001-1017 PT Time Calculation (min) (ACUTE ONLY): 16 min  Charges:    $Gait Training: 8-22 mins PT General Charges $$ ACUTE PT VISIT: 1 Visit                    Rankin Essex  PTA 11/29/23, 10:26 AM

## 2023-11-29 NOTE — Plan of Care (Signed)

## 2023-12-03 ENCOUNTER — Ambulatory Visit: Admitting: Cardiology

## 2023-12-04 DIAGNOSIS — M179 Osteoarthritis of knee, unspecified: Secondary | ICD-10-CM | POA: Diagnosis not present

## 2023-12-04 DIAGNOSIS — Z791 Long term (current) use of non-steroidal anti-inflammatories (NSAID): Secondary | ICD-10-CM | POA: Diagnosis not present

## 2023-12-04 DIAGNOSIS — D649 Anemia, unspecified: Secondary | ICD-10-CM | POA: Diagnosis not present

## 2023-12-04 DIAGNOSIS — I651 Occlusion and stenosis of basilar artery: Secondary | ICD-10-CM | POA: Diagnosis not present

## 2023-12-04 DIAGNOSIS — Z7982 Long term (current) use of aspirin: Secondary | ICD-10-CM | POA: Diagnosis not present

## 2023-12-04 DIAGNOSIS — Z556 Problems related to health literacy: Secondary | ICD-10-CM | POA: Diagnosis not present

## 2023-12-04 DIAGNOSIS — Z79899 Other long term (current) drug therapy: Secondary | ICD-10-CM | POA: Diagnosis not present

## 2023-12-04 DIAGNOSIS — H8113 Benign paroxysmal vertigo, bilateral: Secondary | ICD-10-CM | POA: Diagnosis not present

## 2023-12-04 DIAGNOSIS — I10 Essential (primary) hypertension: Secondary | ICD-10-CM | POA: Diagnosis not present

## 2023-12-04 DIAGNOSIS — G9589 Other specified diseases of spinal cord: Secondary | ICD-10-CM | POA: Diagnosis not present

## 2023-12-04 DIAGNOSIS — E854 Organ-limited amyloidosis: Secondary | ICD-10-CM | POA: Diagnosis not present

## 2023-12-04 DIAGNOSIS — K5900 Constipation, unspecified: Secondary | ICD-10-CM | POA: Diagnosis not present

## 2023-12-04 DIAGNOSIS — Q211 Atrial septal defect, unspecified: Secondary | ICD-10-CM | POA: Diagnosis not present

## 2023-12-04 DIAGNOSIS — S2690XD Unspecified injury of heart, unspecified with or without hemopericardium, subsequent encounter: Secondary | ICD-10-CM | POA: Diagnosis not present

## 2023-12-04 DIAGNOSIS — Z9181 History of falling: Secondary | ICD-10-CM | POA: Diagnosis not present

## 2023-12-06 ENCOUNTER — Ambulatory Visit (INDEPENDENT_AMBULATORY_CARE_PROVIDER_SITE_OTHER): Admitting: Cardiology

## 2023-12-06 ENCOUNTER — Encounter: Payer: Self-pay | Admitting: Cardiology

## 2023-12-06 VITALS — BP 132/60 | HR 72 | Ht 62.0 in | Wt 169.4 lb

## 2023-12-06 DIAGNOSIS — Q211 Atrial septal defect, unspecified: Secondary | ICD-10-CM

## 2023-12-06 DIAGNOSIS — G959 Disease of spinal cord, unspecified: Secondary | ICD-10-CM | POA: Diagnosis not present

## 2023-12-06 DIAGNOSIS — R7303 Prediabetes: Secondary | ICD-10-CM

## 2023-12-06 DIAGNOSIS — M4802 Spinal stenosis, cervical region: Secondary | ICD-10-CM | POA: Diagnosis not present

## 2023-12-06 DIAGNOSIS — Z0130 Encounter for examination of blood pressure without abnormal findings: Secondary | ICD-10-CM

## 2023-12-06 NOTE — Progress Notes (Signed)
 Established Patient Office Visit  Subjective:  Patient ID: Tammy Hendricks, female    DOB: 1936-03-27  Age: 88 y.o. MRN: [id number]  Chief Complaint  Patient presents with   Follow-up    Hospital follow up    Patient in office for hospital follow up. Patient reports feeling better. Patient went to ED on 11/25/23 with reports of stroke like symptoms. Patient will be receiving PT at her home.  Patient found to have an ASD on bubble study, recommended to follow up with cardiology, will place a referral today.  MRI C-spine confirmed severe stenosis and neurosurgery consulted as neurology believes stenosis explains her presenting symptoms. Surgical intervention offered but pt would like to defer and follow outpatient. Patient scheduled to see neurosurgery as an outpatient, patient requesting a second opinion with a different provider, preferably at [facility] Referral sent.  Blood pressure well controlled today. Continue same medications.     No other concerns at this time.   Past Medical History:  Diagnosis Date   Arthritis    right knee   Hypertension    Pre-diabetes    Sciatica    left side - occasional   Vertigo    sporadic   Wears dentures    partial upper, full lower    Past Surgical History:  Procedure Laterality Date   ABDOMINAL HYSTERECTOMY     APPENDECTOMY     CATARACT EXTRACTION W/PHACO Right 11/09/2015   Procedure: CATARACT EXTRACTION PHACO AND INTRAOCULAR LENS PLACEMENT (IOC) right eye;  Surgeon: Dene Etienne, MD;  Location: [facility];  Service: Ophthalmology;  Laterality: Right;   CATARACT EXTRACTION W/PHACO Left 12/03/2017   Procedure: CATARACT EXTRACTION PHACO AND INTRAOCULAR LENS PLACEMENT (IOC) LEFT EYE;  Surgeon: Etienne Dene, MD;  Location: [facility];  Service: Ophthalmology;  Laterality: Left;   CHOLECYSTECTOMY     COLONOSCOPY     OOPHORECTOMY     TONSILLECTOMY      Social History   Socioeconomic History   Marital  status: Widowed    Spouse name: Not on file   Number of children: Not on file   Years of education: Not on file   Highest education level: Not on file  Occupational History   Not on file  Tobacco Use   Smoking status: Never   Smokeless tobacco: Never  Vaping Use   Vaping status: Never Used  Substance and Sexual Activity   Alcohol use: No   Drug use: Not on file   Sexual activity: Not on file  Other Topics Concern   Not on file  Social History Narrative   Not on file   Social Drivers of Health   Financial Resource Strain: Low Risk  (04/02/2023)   Overall Financial Resource Strain (CARDIA)    Difficulty of Paying Living Expenses: Not very hard  Food Insecurity: No Food Insecurity (11/26/2023)   Hunger Vital Sign    Worried About Running Out of Food in the Last Year: Never true    Ran Out of Food in the Last Year: Never true  Transportation Needs: No Transportation Needs (11/26/2023)   PRAPARE - Administrator, Civil Service (Medical): No    Lack of Transportation (Non-Medical): No  Physical Activity: Not on file  Stress: Not on file  Social Connections: Unknown (11/26/2023)   Social Connection and Isolation Panel    Frequency of Communication with Friends and Family: Twice a week    Frequency of Social Gatherings with Friends and Family: Patient declined  Attends Religious Services: Patient declined    Active Member of Clubs or Organizations: Patient declined    Attends Banker Meetings: Patient declined    Marital Status: Widowed  Intimate Partner Violence: Not At Risk (11/27/2023)   Humiliation, Afraid, Rape, and Kick questionnaire    Fear of Current or Ex-Partner: No    Emotionally Abused: No    Physically Abused: No    Sexually Abused: No    Family History  Problem Relation Age of Onset   Dementia Mother    Hypertension Father     No Known Allergies  Outpatient Medications Prior to Visit  Medication Sig   acetaminophen  (TYLENOL ) 650 MG  CR tablet Take 650 mg by mouth every 8 (eight) hours as needed for pain.   aspirin  EC 81 MG tablet Take 81 mg by mouth daily. Swallow whole.   brimonidine  (ALPHAGAN ) 0.2 % ophthalmic solution Place 1 drop into both eyes 2 (two) times daily.   fluticasone  (FLONASE ) 50 MCG/ACT nasal spray Place 1 spray into both nostrils daily.   hydrochlorothiazide  (HYDRODIURIL ) 25 MG tablet TAKE 1 TABLET BY MOUTH ONCE  DAILY   Krill Oil 500 MG CAPS Take 1 capsule by mouth daily.   latanoprost (XALATAN) 0.005 % ophthalmic solution Place 1 drop into both eyes at bedtime.   loratadine (CLARITIN) 10 MG tablet Take 10 mg by mouth daily.   losartan  (COZAAR ) 25 MG tablet Take 1 tablet (25 mg total) by mouth daily.   meloxicam  (MOBIC ) 15 MG tablet Take 1 tablet (15 mg total) by mouth daily.   pantoprazole  (PROTONIX ) 40 MG tablet Take 1 tablet (40 mg total) by mouth daily.   potassium chloride  SA (KLOR-CON  M) 20 MEQ tablet Take 1 tablet (20 mEq total) by mouth daily.   senna-docusate (SENOKOT-S) 8.6-50 MG tablet Take 1 tablet by mouth daily.   timolol  (TIMOPTIC ) 0.5 % ophthalmic solution    No facility-administered medications prior to visit.    Review of Systems  Constitutional: Negative.   HENT: Negative.    Eyes: Negative.   Respiratory: Negative.  Negative for shortness of breath.   Cardiovascular: Negative.  Negative for chest pain.  Gastrointestinal: Negative.  Negative for abdominal pain, constipation and diarrhea.  Genitourinary: Negative.   Musculoskeletal:  Negative for joint pain and myalgias.  Skin: Negative.   Neurological: Negative.  Negative for dizziness and headaches.  Endo/Heme/Allergies: Negative.   All other systems reviewed and are negative.      Objective:   BP 132/60   Pulse 72   Ht 5' 2 (1.575 m)   Wt 169 lb 6.4 oz (76.8 kg)   SpO2 95%   BMI 30.98 kg/m   Vitals:   12/06/23 1116  BP: 132/60  Pulse: 72  Height: 5' 2 (1.575 m)  Weight: 169 lb 6.4 oz (76.8 kg)  SpO2:  95%  BMI (Calculated): 30.98    Physical Exam Vitals and nursing note reviewed.  Constitutional:      Appearance: Normal appearance. She is normal weight.  HENT:     Head: Normocephalic and atraumatic.     Nose: Nose normal.     Mouth/Throat:     Mouth: Mucous membranes are moist.  Eyes:     Extraocular Movements: Extraocular movements intact.     Conjunctiva/sclera: Conjunctivae normal.     Pupils: Pupils are equal, round, and reactive to light.  Cardiovascular:     Rate and Rhythm: Normal rate and regular rhythm.     Pulses:  Normal pulses.     Heart sounds: Normal heart sounds.  Pulmonary:     Effort: Pulmonary effort is normal.     Breath sounds: Normal breath sounds.  Abdominal:     General: Abdomen is flat. Bowel sounds are normal.     Palpations: Abdomen is soft.  Musculoskeletal:        General: Normal range of motion.     Cervical back: Normal range of motion.  Skin:    General: Skin is warm and dry.  Neurological:     General: No focal deficit present.     Mental Status: She is alert and oriented to person, place, and time.  Psychiatric:        Mood and Affect: Mood normal.        Behavior: Behavior normal.        Thought Content: Thought content normal.        Judgment: Judgment normal.      No results found for any visits on 12/06/23.  Recent Results (from the past 2160 hours)  Protime-INR     Status: None   Collection Time: 11/25/23  6:17 PM  Result Value Ref Range   Prothrombin Time 14.9 11.4 - 15.2 seconds   INR 1.1 0.8 - 1.2    Comment: (NOTE) INR goal varies based on device and disease states. Performed at [facility], [address] [place], [place] [address]   APTT     Status: None   Collection Time: 11/25/23  6:17 PM  Result Value Ref Range   aPTT 27 24 - 36 seconds    Comment: Performed at [facility], [address] [place], [place] [address]  Comprehensive metabolic panel     Status: Abnormal   Collection  Time: 11/25/23  6:17 PM  Result Value Ref Range   Sodium 139 135 - 145 mmol/L   Potassium 3.3 (L) 3.5 - 5.1 mmol/L   Chloride 101 98 - 111 mmol/L   CO2 26 22 - 32 mmol/L   Glucose, Bld 75 70 - 99 mg/dL    Comment: Glucose reference range applies only to samples taken after fasting for at least 8 hours.   BUN 29 (H) 8 - 23 mg/dL   Creatinine, Ser 8.93 (H) 0.44 - 1.00 mg/dL   Calcium 9.0 8.9 - 89.6 mg/dL   Total Protein 7.2 6.5 - 8.1 g/dL   Albumin 3.4 (L) 3.5 - 5.0 g/dL   AST 22 15 - 41 U/L   ALT 18 0 - 44 U/L   Alkaline Phosphatase 69 38 - 126 U/L   Total Bilirubin 0.7 0.0 - 1.2 mg/dL   GFR, Estimated 51 (L) >60 mL/min    Comment: (NOTE) Calculated using the CKD-EPI Creatinine Equation (2021)    Anion gap 12 5 - 15    Comment: Performed at [facility], [address] [place], [place] [address]  Ethanol     Status: None   Collection Time: 11/25/23  6:17 PM  Result Value Ref Range   Alcohol, Ethyl (B) <15 <15 mg/dL    Comment: (NOTE) For medical purposes only. Performed at [facility], [address] [place], [place] [address]   CBC with Differential/Platelet     Status: Abnormal   Collection Time: 11/25/23  6:17 PM  Result Value Ref Range   WBC 5.0 4.0 - 10.5 K/uL   RBC 4.28 3.87 - 5.11 MIL/uL   Hemoglobin 10.4 (L) 12.0 - 15.0 g/dL  HCT 32.5 (L) 36.0 - 46.0 %   MCV 75.9 (L) 80.0 - 100.0 fL   MCH 24.3 (L) 26.0 - 34.0 pg   MCHC 32.0 30.0 - 36.0 g/dL   RDW 85.3 88.4 - 84.4 %   Platelets 205 150 - 400 K/uL   nRBC 0.0 0.0 - 0.2 %   Neutrophils Relative % 48 %   Neutro Abs 2.3 1.7 - 7.7 K/uL   Lymphocytes Relative 37 %   Lymphs Abs 1.9 0.7 - 4.0 K/uL   Monocytes Relative 13 %   Monocytes Absolute 0.6 0.1 - 1.0 K/uL   Eosinophils Relative 2 %   Eosinophils Absolute 0.1 0.0 - 0.5 K/uL   Basophils Relative 0 %   Basophils Absolute 0.0 0.0 - 0.1 K/uL   Immature Granulocytes 0 %   Abs Immature Granulocytes 0.01 0.00 - 0.07 K/uL    Comment:  Performed at [facility] [address], [place], [place] [address]  Troponin I (High Sensitivity)     Status: None   Collection Time: 11/26/23  2:05 AM  Result Value Ref Range   Troponin I (High Sensitivity) 4 <18 ng/L    Comment: (NOTE) Elevated high sensitivity troponin I (hsTnI) values and significant  changes across serial measurements may suggest ACS but many other  chronic and acute conditions are known to elevate hsTnI results.  Refer to the Links section for chest pain algorithms and additional  guidance. Performed at [facility], [address] [place], [place] [address]   TSH     Status: None   Collection Time: 11/26/23  2:05 AM  Result Value Ref Range   TSH 0.764 0.350 - 4.500 uIU/mL    Comment: Performed by a 3rd Generation assay with a functional sensitivity of <=0.01 uIU/mL. Performed at [facility], [address] [place], [place] [address]   Urine Culture (for pregnant, neutropenic or urologic patients or patients with an indwelling urinary catheter)     Status: Abnormal   Collection Time: 11/26/23  2:05 AM   Specimen: Urine, Clean Catch  Result Value Ref Range   Specimen Description      URINE, CLEAN CATCH Performed at [facility], [address], [place], [place] [address]    Special Requests      NONE Performed at [facility], [address] [place], [place] [address]    Culture >=100,000 COLONIES/mL KLEBSIELLA PNEUMONIAE (A)    Report Status 11/28/2023 FINAL    Organism ID, Bacteria KLEBSIELLA PNEUMONIAE (A)       Susceptibility   Klebsiella pneumoniae - MIC*    AMPICILLIN RESISTANT Resistant     CEFAZOLIN <=4 SENSITIVE Sensitive     CEFEPIME <=0.12 SENSITIVE Sensitive     CEFTRIAXONE  <=0.25 SENSITIVE Sensitive     CIPROFLOXACIN <=0.25 SENSITIVE Sensitive     GENTAMICIN <=1 SENSITIVE Sensitive     IMIPENEM <=0.25 SENSITIVE Sensitive     NITROFURANTOIN 64 INTERMEDIATE Intermediate      TRIMETH/SULFA <=20 SENSITIVE Sensitive     AMPICILLIN/SULBACTAM 4 SENSITIVE Sensitive     PIP/TAZO <=4 SENSITIVE Sensitive ug/mL    * >=100,000 COLONIES/mL KLEBSIELLA PNEUMONIAE  Urinalysis, Routine w reflex microscopic -Urine, Clean Catch     Status: Abnormal   Collection Time: 11/26/23  3:45 AM  Result Value Ref Range   Color, Urine YELLOW (A) YELLOW   APPearance HAZY (A) CLEAR   Specific Gravity, Urine 1.016 1.005 - 1.030   pH 7.0 5.0 - 8.0  Glucose, UA NEGATIVE NEGATIVE mg/dL   Hgb urine dipstick NEGATIVE NEGATIVE   Bilirubin Urine NEGATIVE NEGATIVE   Ketones, ur NEGATIVE NEGATIVE mg/dL   Protein, ur NEGATIVE NEGATIVE mg/dL   Nitrite NEGATIVE NEGATIVE   Leukocytes,Ua SMALL (A) NEGATIVE   RBC / HPF 0-5 0 - 5 RBC/hpf   WBC, UA 6-10 0 - 5 WBC/hpf   Bacteria, UA MANY (A) NONE SEEN   Squamous Epithelial / HPF 0-5 0 - 5 /HPF   Mucus PRESENT     Comment: Performed at [facility], [address] [place], [place] [address]  Troponin I (High Sensitivity)     Status: None   Collection Time: 11/26/23  5:13 AM  Result Value Ref Range   Troponin I (High Sensitivity) 4 <18 ng/L    Comment: (NOTE) Elevated high sensitivity troponin I (hsTnI) values and significant  changes across serial measurements may suggest ACS but many other  chronic and acute conditions are known to elevate hsTnI results.  Refer to the Links section for chest pain algorithms and additional  guidance. Performed at [facility], [address] [place], [place] [address]   Lipid panel     Status: None   Collection Time: 11/26/23  5:13 AM  Result Value Ref Range   Cholesterol 135 0 - 200 mg/dL   Triglycerides 57 <849 mg/dL   HDL 42 >59 mg/dL   Total CHOL/HDL Ratio 3.2 RATIO   VLDL 11 0 - 40 mg/dL   LDL Cholesterol 82 0 - 99 mg/dL    Comment:        Total Cholesterol/HDL:CHD Risk Coronary Heart Disease Risk Table                     Men   Women  1/2 Average Risk   3.4   3.3  Average  Risk       5.0   4.4  2 X Average Risk   9.6   7.1  3 X Average Risk  23.4   11.0        Use the calculated Patient Ratio above and the CHD Risk Table to determine the patient's CHD Risk.        ATP III CLASSIFICATION (LDL):  <100     mg/dL   Optimal  899-870  mg/dL   Near or Above                    Optimal  130-159  mg/dL   Borderline  839-810  mg/dL   High  >809     mg/dL   Very High Performed at [facility], [address] [place], [place] [address]   ECHOCARDIOGRAM COMPLETE     Status: None   Collection Time: 11/26/23  1:59 PM  Result Value Ref Range   Weight 2,720 oz   Height 62 in   BP 194/88 mmHg   S' Lateral 3.00 cm   Area-P 1/2 4.29 cm2   AV Area VTI 2.45 cm2   AR max vel 2.33 cm2   AV Area mean vel 2.18 cm2   Est EF 60 - 65%    Ao pk vel 1.28 m/s   AV Peak grad 6.6 mmHg   AV Mean grad 3.4 mmHg  CBC     Status: Abnormal   Collection Time: 11/29/23  3:33 AM  Result Value Ref Range   WBC 4.9 4.0 - 10.5 K/uL   RBC 4.59 3.87 - 5.11  MIL/uL   Hemoglobin 11.2 (L) 12.0 - 15.0 g/dL   HCT 65.3 (L) 63.9 - 53.9 %   MCV 75.4 (L) 80.0 - 100.0 fL   MCH 24.4 (L) 26.0 - 34.0 pg   MCHC 32.4 30.0 - 36.0 g/dL   RDW 85.8 88.4 - 84.4 %   Platelets 202 150 - 400 K/uL   nRBC 0.0 0.0 - 0.2 %    Comment: Performed at [facility], [address] [place], [place] [address]  Basic metabolic panel with GFR     Status: Abnormal   Collection Time: 11/29/23  3:33 AM  Result Value Ref Range   Sodium 141 135 - 145 mmol/L   Potassium 3.2 (L) 3.5 - 5.1 mmol/L   Chloride 103 98 - 111 mmol/L   CO2 28 22 - 32 mmol/L   Glucose, Bld 100 (H) 70 - 99 mg/dL    Comment: Glucose reference range applies only to samples taken after fasting for at least 8 hours.   BUN 16 8 - 23 mg/dL   Creatinine, Ser 9.13 0.44 - 1.00 mg/dL   Calcium 9.3 8.9 - 89.6 mg/dL   GFR, Estimated >39 >39 mL/min    Comment: (NOTE) Calculated using the CKD-EPI Creatinine Equation (2021)    Anion  gap 10 5 - 15    Comment: Performed at [facility], [address], [place], [place] [address]      Assessment & Plan:  Referral sent to neurosurgery for second opinion Referral sent to cardiology Continue same medications  Problem List Items Addressed This Visit       Nervous and Auditory   Cervical myelopathy (HCC) - Primary   Relevant Orders   Ambulatory referral to Neurosurgery     Other   Spinal stenosis in cervical region   Relevant Orders   Ambulatory referral to Neurosurgery   Other Visit Diagnoses       ASD (atrial septal defect)       Relevant Orders   Ambulatory referral to Cardiology       Return if symptoms worsen or fail to improve, for as scheduled with NK.   Total time spent: 25 minutes  [employer], NP  12/06/2023   This document may have been prepared by Dragon Voice Recognition software and as such may include unintentional dictation errors.

## 2023-12-07 ENCOUNTER — Other Ambulatory Visit: Payer: Self-pay

## 2023-12-07 ENCOUNTER — Emergency Department
Admission: EM | Admit: 2023-12-07 | Discharge: 2023-12-08 | Disposition: A | Discharge status: Home / Self Care | Attending: Emergency Medicine | Admitting: Emergency Medicine

## 2023-12-07 DIAGNOSIS — R58 Hemorrhage, not elsewhere classified: Secondary | ICD-10-CM | POA: Diagnosis not present

## 2023-12-07 DIAGNOSIS — I10 Essential (primary) hypertension: Secondary | ICD-10-CM | POA: Diagnosis not present

## 2023-12-07 DIAGNOSIS — K5900 Constipation, unspecified: Secondary | ICD-10-CM | POA: Diagnosis not present

## 2023-12-07 DIAGNOSIS — K625 Hemorrhage of anus and rectum: Secondary | ICD-10-CM | POA: Diagnosis not present

## 2023-12-07 NOTE — ED Provider Notes (Signed)
 [facility] Provider Note   Event Date/Time   First MD Initiated Contact with Patient 12/07/23 2307     (approximate) History  Rectal Bleeding  HPI BRIYANNA Hendricks is a 88 y.o. female with a past medical history of hypertension, hyperlipidemia, prediabetes who presents complaining of of bright red blood per rectum after a bowel movement this evening.  EMS states that they witnessed this bowel movements and saw small amount of bright red blood on top of the stool.  Of note, patient was significantly constipated prior to using a suppository earlier today and having a large bowel movement when she noticed this blood.  Patient denies any blood thinner use.  EMS note patient was hypertensive throughout their transport ROS: Patient currently denies any vision changes, tinnitus, difficulty speaking, facial droop, sore throat, chest pain, shortness of breath, abdominal pain, nausea/vomiting/diarrhea, dysuria, or weakness/numbness/paresthesias in any extremity   Physical Exam  Triage Vital Signs: ED Triage Vitals  Encounter Vitals Group     BP      Girls Systolic BP Percentile      Girls Diastolic BP Percentile      Boys Systolic BP Percentile      Boys Diastolic BP Percentile      Pulse      Resp      Temp      Temp src      SpO2      Weight      Height      Head Circumference      Peak Flow      Pain Score      Pain Loc      Pain Education      Exclude from Growth Chart    Most recent vital signs: Vitals:   12/07/23 2307  BP: (!) 196/97  Pulse: 95  Resp: 17  Temp: 98 F (36.7 C)  SpO2: 100%   General: Awake, oriented x4. CV:  Good peripheral perfusion. Resp:  Normal effort. Abd:  No distention. Other:  Elderly overweight African-American female resting comfortably in no acute distress ED Results / Procedures / Treatments  Labs (all labs ordered are listed, but only abnormal results are displayed) Labs Reviewed  COMPREHENSIVE METABOLIC PANEL  WITH GFR - Abnormal; Notable for the following components:      Result Value   Sodium 130 (*)    Potassium 3.4 (*)    Chloride 90 (*)    Glucose, Bld 112 (*)    BUN 30 (*)    Total Protein 8.2 (*)    All other components within normal limits  CBC WITH DIFFERENTIAL/PLATELET - Abnormal; Notable for the following components:   Hemoglobin 11.5 (*)    HCT 34.5 (*)    MCV 75.0 (*)    MCH 25.0 (*)    All other components within normal limits  PROTIME-INR  TYPE AND SCREEN   PROCEDURES: Critical Care performed: No Procedures MEDICATIONS ORDERED IN ED: Medications - No data to display IMPRESSION / MDM / ASSESSMENT AND PLAN / ED COURSE  I reviewed the triage vital signs and the nursing notes.                             The patient is on the cardiac monitor to evaluate for evidence of arrhythmia and/or significant heart rate changes. Patient's presentation is most consistent with acute presentation with potential threat to life or bodily function. Given  history and exam patient's presentation most consistent with Lower GI bleed possibly secondary to hemorrhoid or other nonemergent cause of bleeding. I have low suspicion for Aortoenteric fistula, Upper GI Bleed, IBD, Mesenteric Ischemia, Rectal foreign body or ulcer.  Workup: CBC, BMP, PT/INR, Type and Screen  Disposition: Discharge. Hemodynamically stable with no gross blood on rectal exam. SRP and prompt PCP follow up.   FINAL CLINICAL IMPRESSION(S) / ED DIAGNOSES   Final diagnoses:  Rectal bleeding   Rx / DC Orders   ED Discharge Orders     None      Note:  This document was prepared using Dragon voice recognition software and may include unintentional dictation errors.   Devlin Mcveigh K, MD 12/08/23 [phone]

## 2023-12-07 NOTE — ED Triage Notes (Signed)
 Pt BIB EMS from home with complaints of rectal bleeding after a bowel movement. Pt states she had been constipated and final was able to have a stool. Per EMS the was a small amount of bright red blood on top of stool. Pt denies blood thinners. Pt denies pain.

## 2023-12-08 LAB — CBC WITH DIFFERENTIAL/PLATELET
Abs Immature Granulocytes: 0.02 K/uL (ref 0.00–0.07)
Basophils Absolute: 0 K/uL (ref 0.0–0.1)
Basophils Relative: 0 %
Eosinophils Absolute: 0.1 K/uL (ref 0.0–0.5)
Eosinophils Relative: 2 %
HCT: 34.5 % — ABNORMAL LOW (ref 36.0–46.0)
Hemoglobin: 11.5 g/dL — ABNORMAL LOW (ref 12.0–15.0)
Immature Granulocytes: 0 %
Lymphocytes Relative: 25 %
Lymphs Abs: 1.7 K/uL (ref 0.7–4.0)
MCH: 25 pg — ABNORMAL LOW (ref 26.0–34.0)
MCHC: 33.3 g/dL (ref 30.0–36.0)
MCV: 75 fL — ABNORMAL LOW (ref 80.0–100.0)
Monocytes Absolute: 0.6 K/uL (ref 0.1–1.0)
Monocytes Relative: 8 %
Neutro Abs: 4.4 K/uL (ref 1.7–7.7)
Neutrophils Relative %: 65 %
Platelets: 198 K/uL (ref 150–400)
RBC: 4.6 MIL/uL (ref 3.87–5.11)
RDW: 13.6 % (ref 11.5–15.5)
WBC: 6.8 K/uL (ref 4.0–10.5)
nRBC: 0 % (ref 0.0–0.2)

## 2023-12-08 LAB — COMPREHENSIVE METABOLIC PANEL WITH GFR
ALT: 22 U/L (ref 0–44)
AST: 27 U/L (ref 15–41)
Albumin: 3.9 g/dL (ref 3.5–5.0)
Alkaline Phosphatase: 71 U/L (ref 38–126)
Anion gap: 12 (ref 5–15)
BUN: 30 mg/dL — ABNORMAL HIGH (ref 8–23)
CO2: 28 mmol/L (ref 22–32)
Calcium: 9.2 mg/dL (ref 8.9–10.3)
Chloride: 90 mmol/L — ABNORMAL LOW (ref 98–111)
Creatinine, Ser: 0.82 mg/dL (ref 0.44–1.00)
GFR, Estimated: >60 mL/min (ref >60)
Glucose, Bld: 112 mg/dL — ABNORMAL HIGH (ref 70–99)
Potassium: 3.4 mmol/L — ABNORMAL LOW (ref 3.5–5.1)
Sodium: 130 mmol/L — ABNORMAL LOW (ref 135–145)
Total Bilirubin: 1.1 mg/dL (ref 0.0–1.2)
Total Protein: 8.2 g/dL — ABNORMAL HIGH (ref 6.5–8.1)

## 2023-12-08 LAB — PROTIME-INR
INR: 1.1 (ref 0.8–1.2)
Prothrombin Time: 14.5 s (ref 11.4–15.2)

## 2023-12-08 LAB — TYPE AND SCREEN
ABO/RH(D): O NEG
Antibody Screen: NEGATIVE

## 2023-12-08 NOTE — ED Notes (Signed)
 Pt given DC instructions. Pt verbalized understanding of follow up care. Pt taken from ED in wheelchair by this RN. Pt in NAD at departure.

## 2023-12-16 DIAGNOSIS — H8113 Benign paroxysmal vertigo, bilateral: Secondary | ICD-10-CM | POA: Diagnosis not present

## 2023-12-16 DIAGNOSIS — M4802 Spinal stenosis, cervical region: Secondary | ICD-10-CM | POA: Diagnosis not present

## 2023-12-16 DIAGNOSIS — I651 Occlusion and stenosis of basilar artery: Secondary | ICD-10-CM | POA: Diagnosis not present

## 2023-12-16 DIAGNOSIS — G9589 Other specified diseases of spinal cord: Secondary | ICD-10-CM | POA: Diagnosis not present

## 2023-12-16 DIAGNOSIS — Z7982 Long term (current) use of aspirin: Secondary | ICD-10-CM | POA: Diagnosis not present

## 2023-12-16 DIAGNOSIS — Z791 Long term (current) use of non-steroidal anti-inflammatories (NSAID): Secondary | ICD-10-CM | POA: Diagnosis not present

## 2023-12-16 DIAGNOSIS — K5900 Constipation, unspecified: Secondary | ICD-10-CM | POA: Diagnosis not present

## 2023-12-16 DIAGNOSIS — D649 Anemia, unspecified: Secondary | ICD-10-CM | POA: Diagnosis not present

## 2023-12-16 DIAGNOSIS — S2690XD Unspecified injury of heart, unspecified with or without hemopericardium, subsequent encounter: Secondary | ICD-10-CM | POA: Diagnosis not present

## 2023-12-16 DIAGNOSIS — Z79899 Other long term (current) drug therapy: Secondary | ICD-10-CM | POA: Diagnosis not present

## 2023-12-16 DIAGNOSIS — I10 Essential (primary) hypertension: Secondary | ICD-10-CM | POA: Diagnosis not present

## 2023-12-16 DIAGNOSIS — Q211 Atrial septal defect, unspecified: Secondary | ICD-10-CM | POA: Diagnosis not present

## 2023-12-16 DIAGNOSIS — E854 Organ-limited amyloidosis: Secondary | ICD-10-CM | POA: Diagnosis not present

## 2023-12-16 DIAGNOSIS — Z556 Problems related to health literacy: Secondary | ICD-10-CM | POA: Diagnosis not present

## 2023-12-16 DIAGNOSIS — Z9181 History of falling: Secondary | ICD-10-CM | POA: Diagnosis not present

## 2023-12-18 DIAGNOSIS — D649 Anemia, unspecified: Secondary | ICD-10-CM | POA: Diagnosis not present

## 2023-12-19 ENCOUNTER — Ambulatory Visit: Admitting: Neurosurgery

## 2023-12-23 DIAGNOSIS — Q211 Atrial septal defect, unspecified: Secondary | ICD-10-CM | POA: Diagnosis not present

## 2023-12-23 DIAGNOSIS — Z7982 Long term (current) use of aspirin: Secondary | ICD-10-CM | POA: Diagnosis not present

## 2023-12-23 DIAGNOSIS — Z791 Long term (current) use of non-steroidal anti-inflammatories (NSAID): Secondary | ICD-10-CM | POA: Diagnosis not present

## 2023-12-23 DIAGNOSIS — I10 Essential (primary) hypertension: Secondary | ICD-10-CM | POA: Diagnosis not present

## 2023-12-23 DIAGNOSIS — D649 Anemia, unspecified: Secondary | ICD-10-CM | POA: Diagnosis not present

## 2023-12-23 DIAGNOSIS — Z9181 History of falling: Secondary | ICD-10-CM | POA: Diagnosis not present

## 2023-12-23 DIAGNOSIS — E854 Organ-limited amyloidosis: Secondary | ICD-10-CM | POA: Diagnosis not present

## 2023-12-23 DIAGNOSIS — K5900 Constipation, unspecified: Secondary | ICD-10-CM | POA: Diagnosis not present

## 2023-12-23 DIAGNOSIS — G9589 Other specified diseases of spinal cord: Secondary | ICD-10-CM | POA: Diagnosis not present

## 2023-12-23 DIAGNOSIS — H8113 Benign paroxysmal vertigo, bilateral: Secondary | ICD-10-CM | POA: Diagnosis not present

## 2023-12-23 DIAGNOSIS — Z79899 Other long term (current) drug therapy: Secondary | ICD-10-CM | POA: Diagnosis not present

## 2023-12-23 DIAGNOSIS — S2690XD Unspecified injury of heart, unspecified with or without hemopericardium, subsequent encounter: Secondary | ICD-10-CM | POA: Diagnosis not present

## 2023-12-23 DIAGNOSIS — M4802 Spinal stenosis, cervical region: Secondary | ICD-10-CM | POA: Diagnosis not present

## 2023-12-23 DIAGNOSIS — Z556 Problems related to health literacy: Secondary | ICD-10-CM | POA: Diagnosis not present

## 2023-12-23 DIAGNOSIS — M179 Osteoarthritis of knee, unspecified: Secondary | ICD-10-CM | POA: Diagnosis not present

## 2023-12-23 DIAGNOSIS — I651 Occlusion and stenosis of basilar artery: Secondary | ICD-10-CM | POA: Diagnosis not present

## 2023-12-24 ENCOUNTER — Other Ambulatory Visit: Payer: Self-pay | Admitting: Internal Medicine

## 2023-12-24 ENCOUNTER — Telehealth: Payer: Self-pay

## 2023-12-24 ENCOUNTER — Other Ambulatory Visit: Payer: Self-pay

## 2023-12-24 DIAGNOSIS — Z77120 Contact with and (suspected) exposure to mold (toxic): Secondary | ICD-10-CM

## 2023-12-24 MED ORDER — LOSARTAN POTASSIUM 25 MG PO TABS: 25.0000 mg | ORAL_TABLET | Freq: Every day | ORAL | 1 refills | Status: DC

## 2023-12-24 NOTE — Telephone Encounter (Signed)
 Patient niece called stating that she think the patient has mold in her home since she keeps getting sick, is there a lab test that we can draw to check to see if she's being exposed to it

## 2023-12-24 NOTE — Telephone Encounter (Signed)
Patient niece informed.

## 2023-12-26 ENCOUNTER — Other Ambulatory Visit

## 2023-12-26 ENCOUNTER — Encounter: Payer: Self-pay | Admitting: Neurosurgery

## 2023-12-26 DIAGNOSIS — Z77120 Contact with and (suspected) exposure to mold (toxic): Secondary | ICD-10-CM | POA: Diagnosis not present

## 2023-12-29 LAB — ALLERGEN PROFILE, MOLD
Alternaria Alternata IgE: <0.1 kU/L
Aspergillus Fumigatus IgE: <0.1 kU/L
Aureobasidi Pullulans IgE: <0.1 kU/L
Candida Albicans IgE: <0.1 kU/L
Cladosporium Herbarum IgE: <0.1 kU/L
M009-IgE Fusarium proliferatum: <0.1 kU/L
M014-IgE Epicoccum purpur: <0.1 kU/L
Mucor Racemosus IgE: <0.1 kU/L
Penicillium Chrysogen IgE: <0.1 kU/L
Phoma Betae IgE: <0.1 kU/L
Setomelanomma Rostrat: <0.1 kU/L
Stemphylium Herbarum IgE: <0.1 kU/L

## 2023-12-30 ENCOUNTER — Ambulatory Visit: Payer: Self-pay | Admitting: Internal Medicine

## 2023-12-31 NOTE — Progress Notes (Signed)
Pt informed

## 2024-01-01 DIAGNOSIS — M179 Osteoarthritis of knee, unspecified: Secondary | ICD-10-CM | POA: Diagnosis not present

## 2024-01-01 DIAGNOSIS — Z9181 History of falling: Secondary | ICD-10-CM | POA: Diagnosis not present

## 2024-01-01 DIAGNOSIS — E854 Organ-limited amyloidosis: Secondary | ICD-10-CM | POA: Diagnosis not present

## 2024-01-01 DIAGNOSIS — H8113 Benign paroxysmal vertigo, bilateral: Secondary | ICD-10-CM | POA: Diagnosis not present

## 2024-01-01 DIAGNOSIS — K5900 Constipation, unspecified: Secondary | ICD-10-CM | POA: Diagnosis not present

## 2024-01-01 DIAGNOSIS — I10 Essential (primary) hypertension: Secondary | ICD-10-CM | POA: Diagnosis not present

## 2024-01-01 DIAGNOSIS — Z7982 Long term (current) use of aspirin: Secondary | ICD-10-CM | POA: Diagnosis not present

## 2024-01-01 DIAGNOSIS — M4802 Spinal stenosis, cervical region: Secondary | ICD-10-CM | POA: Diagnosis not present

## 2024-01-01 DIAGNOSIS — S2690XD Unspecified injury of heart, unspecified with or without hemopericardium, subsequent encounter: Secondary | ICD-10-CM | POA: Diagnosis not present

## 2024-01-01 DIAGNOSIS — I651 Occlusion and stenosis of basilar artery: Secondary | ICD-10-CM | POA: Diagnosis not present

## 2024-01-01 DIAGNOSIS — Z791 Long term (current) use of non-steroidal anti-inflammatories (NSAID): Secondary | ICD-10-CM | POA: Diagnosis not present

## 2024-01-01 DIAGNOSIS — Z79899 Other long term (current) drug therapy: Secondary | ICD-10-CM | POA: Diagnosis not present

## 2024-01-01 DIAGNOSIS — Q211 Atrial septal defect, unspecified: Secondary | ICD-10-CM | POA: Diagnosis not present

## 2024-01-01 DIAGNOSIS — D649 Anemia, unspecified: Secondary | ICD-10-CM | POA: Diagnosis not present

## 2024-01-01 DIAGNOSIS — Z556 Problems related to health literacy: Secondary | ICD-10-CM | POA: Diagnosis not present

## 2024-01-01 DIAGNOSIS — G9589 Other specified diseases of spinal cord: Secondary | ICD-10-CM | POA: Diagnosis not present

## 2024-01-03 ENCOUNTER — Ambulatory Visit: Admitting: Cardiovascular Disease

## 2024-01-03 ENCOUNTER — Encounter: Payer: Self-pay | Admitting: Cardiovascular Disease

## 2024-01-03 VITALS — BP 148/76 | HR 74 | Ht 62.0 in | Wt 172.2 lb

## 2024-01-03 DIAGNOSIS — E782 Mixed hyperlipidemia: Secondary | ICD-10-CM | POA: Diagnosis not present

## 2024-01-03 DIAGNOSIS — I10 Essential (primary) hypertension: Secondary | ICD-10-CM

## 2024-01-03 DIAGNOSIS — R29818 Other symptoms and signs involving the nervous system: Secondary | ICD-10-CM

## 2024-01-03 DIAGNOSIS — I6312 Cerebral infarction due to embolism of basilar artery: Secondary | ICD-10-CM | POA: Diagnosis not present

## 2024-01-03 DIAGNOSIS — M4802 Spinal stenosis, cervical region: Secondary | ICD-10-CM

## 2024-01-03 DIAGNOSIS — Q211 Atrial septal defect, unspecified: Secondary | ICD-10-CM | POA: Diagnosis not present

## 2024-01-03 MED ORDER — LOSARTAN POTASSIUM 50 MG PO TABS: 50.0000 mg | ORAL_TABLET | Freq: Two times a day (BID) | ORAL | 11 refills | Status: DC

## 2024-01-03 NOTE — Progress Notes (Signed)
 Cardiology Office Note   Date:  01/03/2024   ID:  Tammy, Hendricks 1935-06-04, MRN [id number]  PCP:  Fernand Fredy RAMAN, MD  Cardiologist:  Denyse Fernand, MD      History of Present Illness: Tammy Hendricks is a 88 y.o. female who presents for  Chief Complaint  Patient presents with   Consult    consult    57 YOF says had a stroke a month ago.  Reviewing the records from the hospital discharge summary, patient's MRI showed cerebral amyloid angiopathy with small vessel disease and microhemorrhages.  Patient also had a echocardiogram which showed LVEF was normal at 60 to 65% with bubble study being positive showing with 6 beats contrast going from right to left atrium.  Thus has intra atrial septal defect.  However neurologist recommended only aspirin  and statins were also not recommended because of amyloid angiopathy.  Patient presented to the hospital with facial droop slurred speech and right leg weakness.  However patient had severe stenosis of the spinal vertebrae requiring surgery but wants a second opinion.  Weakness of the legs may have been related to spinal stenosis.  I was asked to evaluate the patient as to further management for ASD and whether to start the patient on statin or possible anticoagulants.      Past Medical History:  Diagnosis Date   Arthritis    right knee   Hypertension    Pre-diabetes    Sciatica    left side - occasional   Vertigo    sporadic   Wears dentures    partial upper, full lower     Past Surgical History:  Procedure Laterality Date   ABDOMINAL HYSTERECTOMY     APPENDECTOMY     CATARACT EXTRACTION W/PHACO Right 11/09/2015   Procedure: CATARACT EXTRACTION PHACO AND INTRAOCULAR LENS PLACEMENT (IOC) right eye;  Surgeon: Dene Etienne, MD;  Location: [facility];  Service: Ophthalmology;  Laterality: Right;   CATARACT EXTRACTION W/PHACO Left 12/03/2017   Procedure: CATARACT EXTRACTION PHACO AND INTRAOCULAR LENS  PLACEMENT (IOC) LEFT EYE;  Surgeon: Etienne Dene, MD;  Location: [facility];  Service: Ophthalmology;  Laterality: Left;   CHOLECYSTECTOMY     COLONOSCOPY     OOPHORECTOMY     TONSILLECTOMY       Current Outpatient Medications  Medication Sig Dispense Refill   acetaminophen  (TYLENOL ) 650 MG CR tablet Take 650 mg by mouth every 8 (eight) hours as needed for pain.     aspirin  EC 81 MG tablet Take 81 mg by mouth daily. Swallow whole.     brimonidine  (ALPHAGAN ) 0.2 % ophthalmic solution Place 1 drop into both eyes 2 (two) times daily.     fluticasone  (FLONASE ) 50 MCG/ACT nasal spray Place 1 spray into both nostrils daily. 15.8 mL 2   hydrochlorothiazide  (HYDRODIURIL ) 25 MG tablet TAKE 1 TABLET BY MOUTH ONCE  DAILY 100 tablet 2   Krill Oil 500 MG CAPS Take 1 capsule by mouth daily.     latanoprost (XALATAN) 0.005 % ophthalmic solution Place 1 drop into both eyes at bedtime.     losartan  (COZAAR ) 50 MG tablet Take 1 tablet (50 mg total) by mouth 2 (two) times daily. 60 tablet 11   meloxicam  (MOBIC ) 15 MG tablet Take 1 tablet (15 mg total) by mouth daily. 30 tablet 3   pantoprazole  (PROTONIX ) 40 MG tablet Take 1 tablet (40 mg total) by mouth daily. 90 tablet 2   polyethylene glycol (MIRALAX /  GLYCOLAX) 17 g packet Take 17 g by mouth as needed for mild constipation.     potassium chloride  SA (KLOR-CON  M) 20 MEQ tablet Take 1 tablet (20 mEq total) by mouth daily. 100 tablet 2   timolol  (TIMOPTIC ) 0.5 % ophthalmic solution      No current facility-administered medications for this visit.    Allergies:   Patient has no known allergies.    Social History:   reports that she has never smoked. She has never used smokeless tobacco. She reports that she does not drink alcohol. No history on file for drug use.   Family History:  family history includes Dementia in her mother; Hypertension in her father.    ROS:     Review of Systems  Constitutional: Negative.   HENT: Negative.     Eyes: Negative.   Respiratory: Negative.    Gastrointestinal: Negative.   Genitourinary: Negative.   Musculoskeletal: Negative.   Skin: Negative.   Neurological: Negative.   Endo/Heme/Allergies: Negative.   Psychiatric/Behavioral: Negative.    All other systems reviewed and are negative.     All other systems are reviewed and negative.    PHYSICAL EXAM: VS:  BP (!) 148/76   Pulse 74   Ht 5' 2 (1.575 m)   Wt 172 lb 3.2 oz (78.1 kg)   SpO2 98%   BMI 31.50 kg/m  , BMI Body mass index is 31.5 kg/m. Last weight:  Wt Readings from Last 3 Encounters:  01/03/24 172 lb 3.2 oz (78.1 kg)  12/07/23 162 lb (73.5 kg)  12/06/23 169 lb 6.4 oz (76.8 kg)     Physical Exam Constitutional:      Appearance: Normal appearance.  Cardiovascular:     Rate and Rhythm: Normal rate and regular rhythm.     Heart sounds: Normal heart sounds.  Pulmonary:     Effort: Pulmonary effort is normal.     Breath sounds: Normal breath sounds.  Musculoskeletal:     Right lower leg: No edema.     Left lower leg: No edema.  Neurological:     Mental Status: She is alert.       EKG:   Recent Labs: 11/26/2023: TSH 0.764 12/07/2023: ALT 22; BUN 30; Creatinine, Ser 0.82; Hemoglobin 11.5; Platelets 198; Potassium 3.4; Sodium 130    Lipid Panel    Component Value Date/Time   CHOL 135 11/26/2023 0513   CHOL 164 07/05/2023 1538   TRIG 57 11/26/2023 0513   HDL 42 11/26/2023 0513   HDL 55 07/05/2023 1538   CHOLHDL 3.2 11/26/2023 0513   VLDL 11 11/26/2023 0513   LDLCALC 82 11/26/2023 0513   LDLCALC 92 07/05/2023 1538      Other studies Reviewed: Additional studies/ records that were reviewed today include:  Review of the above records demonstrates:       No data to display            ASSESSMENT AND PLAN:    ICD-10-CM   1. Essential hypertension, benign  I10 losartan  (COZAAR ) 50 MG tablet    CARDIAC EVENT MONITOR    2. Mixed hyperlipidemia  E78.2 losartan  (COZAAR ) 50 MG tablet     CARDIAC EVENT MONITOR   LDL is around 80 and the rest of the cholesterol panel is unremarkable we will hold off starting the patient on statin as high risk of hemorrhagic stroke.    3. Spinal stenosis in cervical region  M48.02 losartan  (COZAAR ) 50 MG tablet    CARDIAC  EVENT MONITOR    4. Subacute neurologic deficit  R29.818 losartan  (COZAAR ) 50 MG tablet    CARDIAC EVENT MONITOR    5. 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    6. ASD (atrial septal defect)  Q21.10 CARDIAC EVENT MONITOR   Due to cerebral amyloid angiopathy, unable to start Coumadin and would consider observing the patient on aspirin  81 mg once a day.  Will get Holter.       Problem List Items Addressed This Visit       Cardiovascular and Mediastinum   Essential hypertension, benign - Primary   Relevant Medications   losartan  (COZAAR ) 50 MG tablet   Other Relevant Orders   CARDIAC EVENT MONITOR     Other   Mixed hyperlipidemia   Relevant Medications   losartan  (COZAAR ) 50 MG tablet   Other Relevant Orders   CARDIAC EVENT MONITOR   Subacute neurologic deficit   Relevant Medications   losartan  (COZAAR ) 50 MG tablet   Other Relevant Orders   CARDIAC EVENT MONITOR   Spinal stenosis in cervical region   Relevant Medications   losartan  (COZAAR ) 50 MG tablet   Other Relevant Orders   CARDIAC EVENT MONITOR   Other Visit Diagnoses       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   Relevant Medications   losartan  (COZAAR ) 50 MG tablet   Other Relevant Orders   CARDIAC EVENT MONITOR     ASD (atrial septal defect)       Due to cerebral amyloid  angiopathy, unable to start Coumadin and would consider observing the patient on aspirin  81 mg once a day.  Will get Holter.   Relevant Medications   losartan  (COZAAR ) 50 MG tablet   Other Relevant Orders   CARDIAC EVENT MONITOR          Disposition:   Return in about 1 month (around 02/02/2024) for holter and f/u.    Total time spent: 50 minutes  Signed,  Denyse Bathe, MD  01/03/2024 12:29 PM    [facility]

## 2024-01-06 DIAGNOSIS — I651 Occlusion and stenosis of basilar artery: Secondary | ICD-10-CM | POA: Diagnosis not present

## 2024-01-06 DIAGNOSIS — D649 Anemia, unspecified: Secondary | ICD-10-CM | POA: Diagnosis not present

## 2024-01-06 DIAGNOSIS — Z7982 Long term (current) use of aspirin: Secondary | ICD-10-CM | POA: Diagnosis not present

## 2024-01-06 DIAGNOSIS — G9589 Other specified diseases of spinal cord: Secondary | ICD-10-CM | POA: Diagnosis not present

## 2024-01-06 DIAGNOSIS — Q211 Atrial septal defect, unspecified: Secondary | ICD-10-CM | POA: Diagnosis not present

## 2024-01-06 DIAGNOSIS — K5900 Constipation, unspecified: Secondary | ICD-10-CM | POA: Diagnosis not present

## 2024-01-06 DIAGNOSIS — Z791 Long term (current) use of non-steroidal anti-inflammatories (NSAID): Secondary | ICD-10-CM | POA: Diagnosis not present

## 2024-01-06 DIAGNOSIS — S2690XD Unspecified injury of heart, unspecified with or without hemopericardium, subsequent encounter: Secondary | ICD-10-CM | POA: Diagnosis not present

## 2024-01-06 DIAGNOSIS — Z79899 Other long term (current) drug therapy: Secondary | ICD-10-CM | POA: Diagnosis not present

## 2024-01-06 DIAGNOSIS — H8113 Benign paroxysmal vertigo, bilateral: Secondary | ICD-10-CM | POA: Diagnosis not present

## 2024-01-06 DIAGNOSIS — I10 Essential (primary) hypertension: Secondary | ICD-10-CM | POA: Diagnosis not present

## 2024-01-06 DIAGNOSIS — Z556 Problems related to health literacy: Secondary | ICD-10-CM | POA: Diagnosis not present

## 2024-01-06 DIAGNOSIS — M179 Osteoarthritis of knee, unspecified: Secondary | ICD-10-CM | POA: Diagnosis not present

## 2024-01-06 DIAGNOSIS — M4802 Spinal stenosis, cervical region: Secondary | ICD-10-CM | POA: Diagnosis not present

## 2024-01-06 DIAGNOSIS — E854 Organ-limited amyloidosis: Secondary | ICD-10-CM | POA: Diagnosis not present

## 2024-01-06 DIAGNOSIS — Z9181 History of falling: Secondary | ICD-10-CM | POA: Diagnosis not present

## 2024-01-10 ENCOUNTER — Ambulatory Visit: Admitting: Internal Medicine

## 2024-01-10 ENCOUNTER — Encounter: Payer: Self-pay | Admitting: Internal Medicine

## 2024-01-10 ENCOUNTER — Other Ambulatory Visit

## 2024-01-10 VITALS — BP 122/78 | HR 68 | Ht 62.0 in | Wt 172.6 lb

## 2024-01-10 DIAGNOSIS — I10 Essential (primary) hypertension: Secondary | ICD-10-CM

## 2024-01-10 DIAGNOSIS — I6312 Cerebral infarction due to embolism of basilar artery: Secondary | ICD-10-CM

## 2024-01-10 DIAGNOSIS — E782 Mixed hyperlipidemia: Secondary | ICD-10-CM

## 2024-01-10 DIAGNOSIS — R29818 Other symptoms and signs involving the nervous system: Secondary | ICD-10-CM

## 2024-01-10 DIAGNOSIS — I6389 Other cerebral infarction: Secondary | ICD-10-CM | POA: Diagnosis not present

## 2024-01-10 DIAGNOSIS — M4802 Spinal stenosis, cervical region: Secondary | ICD-10-CM

## 2024-01-10 DIAGNOSIS — Q211 Atrial septal defect, unspecified: Secondary | ICD-10-CM

## 2024-01-10 DIAGNOSIS — G959 Disease of spinal cord, unspecified: Secondary | ICD-10-CM

## 2024-01-10 NOTE — Progress Notes (Signed)
 Established Patient Office Visit  Subjective:  Patient ID: Tammy Hendricks, female    DOB: July 14, 1935  Age: 88 y.o. MRN: [id number]  Chief Complaint  Patient presents with   Follow-up    2 month follow up    Patient comes in for her follow-up of recent hospitalization from 11/25/2023-11/29/2023.  Prior to that patient went to ED on 11/23/2023 with a history of fall where she had tripped on a shoe.  But at her second visit she was complaining of weakness and slurred speech.  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  Patient was advised surgery by the neurosurgeon, but she would like to get a second opinion and has been referred to [facility]. Today patient is accompanied by her family member.  She is sitting comfortably and is feeling well with no new complaints.  Her blood pressure is under better control now, started on losartan  50 mg twice a day, it was high during her hospital stay. Patient denies headaches or dizziness, no focal neurological deficits, denies pain or weakness in her upper arms. She was also found to have a small ASD on echocardiogram, will be evaluated by the cardiologist.    No other concerns at this time.   Past Medical History:  Diagnosis Date   Arthritis    right knee   Hypertension    Pre-diabetes    Sciatica    left side - occasional   Vertigo    sporadic   Wears dentures    partial upper, full lower    Past Surgical History:  Procedure Laterality Date   ABDOMINAL HYSTERECTOMY     APPENDECTOMY     CATARACT EXTRACTION W/PHACO Right 11/09/2015   Procedure: CATARACT EXTRACTION PHACO AND INTRAOCULAR LENS PLACEMENT (IOC) right eye;  Surgeon: Dene Etienne, MD;  [facility];  Service: Ophthalmology;  Laterality: Right;   CATARACT EXTRACTION W/PHACO Left 12/03/2017   Procedure: CATARACT EXTRACTION PHACO AND INTRAOCULAR LENS PLACEMENT (IOC)  LEFT EYE;  Surgeon: Etienne Dene, MD;  Location: [facility];  Service: Ophthalmology;  Laterality: Left;   CHOLECYSTECTOMY     COLONOSCOPY     OOPHORECTOMY     TONSILLECTOMY      Social History   Socioeconomic History   Marital status: Widowed    Spouse name: Not on file   Number of children: Not on file   Years of education: Not on file   Highest education level: Not on file  Occupational History   Not on file  Tobacco Use   Smoking status: Never   Smokeless tobacco: Never  Vaping Use   Vaping status: Never Used  Substance and Sexual Activity   Alcohol use: No   Drug use: Not on file   Sexual activity: Not on file  Other Topics Concern   Not on file  Social History Narrative   Not on file   Social Drivers of Health   Financial Resource Strain: Low Risk  (04/02/2023)   Overall Financial Resource Strain (CARDIA)    Difficulty of Paying Living Expenses: Not very hard  Food Insecurity: No Food Insecurity (11/26/2023)   Hunger Vital Sign    Worried About Running Out of Food in the Last Year: Never true    Ran Out of Food in the Last Year: Never true  Transportation Needs: No Transportation Needs (11/26/2023)   PRAPARE - Transportation  Lack of Transportation (Medical): No    Lack of Transportation (Non-Medical): No  Physical Activity: Not on file  Stress: Not on file  Social Connections: Unknown (11/26/2023)   Social Connection and Isolation Panel    Frequency of Communication with Friends and Family: Twice a week    Frequency of Social Gatherings with Friends and Family: Patient declined    Attends Religious Services: Patient declined    Database administrator or Organizations: Patient declined    Attends Banker Meetings: Patient declined    Marital Status: Widowed  Intimate Partner Violence: Not At Risk (11/27/2023)   Humiliation, Afraid, Rape, and Kick questionnaire    Fear of Current or Ex-Partner: No    Emotionally Abused: No     Physically Abused: No    Sexually Abused: No    Family History  Problem Relation Age of Onset   Dementia Mother    Hypertension Father     No Known Allergies  Outpatient Medications Prior to Visit  Medication Sig   acetaminophen  (TYLENOL ) 650 MG CR tablet Take 650 mg by mouth every 8 (eight) hours as needed for pain.   aspirin  EC 81 MG tablet Take 81 mg by mouth daily. Swallow whole.   brimonidine  (ALPHAGAN ) 0.2 % ophthalmic solution Place 1 drop into both eyes 2 (two) times daily.   fluticasone  (FLONASE ) 50 MCG/ACT nasal spray Place 1 spray into both nostrils daily.   hydrochlorothiazide  (HYDRODIURIL ) 25 MG tablet TAKE 1 TABLET BY MOUTH ONCE  DAILY   Krill Oil 500 MG CAPS Take 1 capsule by mouth daily.   latanoprost (XALATAN) 0.005 % ophthalmic solution Place 1 drop into both eyes at bedtime.   losartan  (COZAAR ) 50 MG tablet Take 1 tablet (50 mg total) by mouth 2 (two) times daily.   meloxicam  (MOBIC ) 15 MG tablet Take 1 tablet (15 mg total) by mouth daily.   pantoprazole  (PROTONIX ) 40 MG tablet Take 1 tablet (40 mg total) by mouth daily.   polyethylene glycol (MIRALAX / GLYCOLAX) 17 g packet Take 17 g by mouth as needed for mild constipation.   potassium chloride  SA (KLOR-CON  M) 20 MEQ tablet Take 1 tablet (20 mEq total) by mouth daily.   timolol  (TIMOPTIC ) 0.5 % ophthalmic solution    No facility-administered medications prior to visit.    Review of Systems  Constitutional: Negative.  Negative for chills, fever and malaise/fatigue.  HENT: Negative.  Negative for congestion and sore throat.   Eyes: Negative.  Negative for blurred vision and pain.  Respiratory: Negative.  Negative for cough and shortness of breath.   Cardiovascular: Negative.  Negative for chest pain, palpitations and leg swelling.  Gastrointestinal: Negative.  Negative for abdominal pain, blood in stool, constipation, diarrhea, heartburn, melena, nausea and vomiting.  Genitourinary: Negative.  Negative for  dysuria, flank pain, frequency and urgency.  Musculoskeletal: Negative.  Negative for joint pain and myalgias.  Skin: Negative.   Neurological: Negative.  Negative for dizziness, tingling, sensory change, weakness and headaches.  Endo/Heme/Allergies: Negative.   Psychiatric/Behavioral: Negative.  Negative for depression and suicidal ideas. The patient is not nervous/anxious.        Objective:   BP 122/78   Pulse 68   Ht 5' 2 (1.575 m)   Wt 172 lb 9.6 oz (78.3 kg)   SpO2 96%   BMI 31.57 kg/m   Vitals:   01/10/24 1430  BP: 122/78  Pulse: 68  Height: 5' 2 (1.575 m)  Weight: 172 lb 9.6  oz (78.3 kg)  SpO2: 96%  BMI (Calculated): 31.56    Physical Exam Vitals and nursing note reviewed.  Constitutional:      Appearance: Normal appearance.  HENT:     Head: Normocephalic and atraumatic.     Nose: Nose normal.     Mouth/Throat:     Mouth: Mucous membranes are moist.     Pharynx: Oropharynx is clear.  Eyes:     Conjunctiva/sclera: Conjunctivae normal.     Pupils: Pupils are equal, round, and reactive to light.  Cardiovascular:     Rate and Rhythm: Normal rate and regular rhythm.     Pulses: Normal pulses.     Heart sounds: Normal heart sounds. No murmur heard. Pulmonary:     Effort: Pulmonary effort is normal.     Breath sounds: Normal breath sounds. No wheezing.  Abdominal:     General: Bowel sounds are normal.     Palpations: Abdomen is soft.     Tenderness: There is no abdominal tenderness. There is no right CVA tenderness or left CVA tenderness.  Musculoskeletal:        General: Normal range of motion.     Cervical back: Normal range of motion.     Right lower leg: No edema.     Left lower leg: No edema.  Skin:    General: Skin is warm and dry.  Neurological:     General: No focal deficit present.     Mental Status: She is alert and oriented to person, place, and time.  Psychiatric:        Mood and Affect: Mood normal.        Behavior: Behavior normal.       No results found for any visits on 01/10/24.  Recent Results (from the past 2160 hours)  Protime-INR     Status: None   Collection Time: 11/25/23  6:17 PM  Result Value Ref Range   Prothrombin Time 14.9 11.4 - 15.2 seconds   INR 1.1 0.8 - 1.2    Comment: (NOTE) INR goal varies based on device and disease states. Performed at [facility], [address] [place], [place] [address]   APTT     Status: None   Collection Time: 11/25/23  6:17 PM  Result Value Ref Range   aPTT 27 24 - 36 seconds    Comment: Performed at [facility], [address] [place], [place] [address]  Comprehensive metabolic panel     Status: Abnormal   Collection Time: 11/25/23  6:17 PM  Result Value Ref Range   Sodium 139 135 - 145 mmol/L   Potassium 3.3 (L) 3.5 - 5.1 mmol/L   Chloride 101 98 - 111 mmol/L   CO2 26 22 - 32 mmol/L   Glucose, Bld 75 70 - 99 mg/dL    Comment: Glucose reference range applies only to samples taken after fasting for at least 8 hours.   BUN 29 (H) 8 - 23 mg/dL   Creatinine, Ser 8.93 (H) 0.44 - 1.00 mg/dL   Calcium 9.0 8.9 - 89.6 mg/dL   Total Protein 7.2 6.5 - 8.1 g/dL   Albumin 3.4 (L) 3.5 - 5.0 g/dL   AST 22 15 - 41 U/L   ALT 18 0 - 44 U/L   Alkaline Phosphatase 69 38 - 126 U/L   Total Bilirubin 0.7 0.0 - 1.2 mg/dL   GFR, Estimated 51 (L) >60 mL/min    Comment: (NOTE) Calculated using the CKD-EPI Creatinine  Equation (2021)    Anion gap 12 5 - 15    Comment: Performed at [facility], [address] [place], [place] [address]  Ethanol     Status: None   Collection Time: 11/25/23  6:17 PM  Result Value Ref Range   Alcohol, Ethyl (B) <15 <15 mg/dL    Comment: (NOTE) For medical purposes only. Performed at [facility], [address] [place], [place] [address]   CBC with Differential/Platelet     Status: Abnormal   Collection Time: 11/25/23  6:17 PM  Result Value Ref Range   WBC 5.0 4.0 - 10.5 K/uL   RBC 4.28 3.87  - 5.11 MIL/uL   Hemoglobin 10.4 (L) 12.0 - 15.0 g/dL   HCT 67.4 (L) 63.9 - 53.9 %   MCV 75.9 (L) 80.0 - 100.0 fL   MCH 24.3 (L) 26.0 - 34.0 pg   MCHC 32.0 30.0 - 36.0 g/dL   RDW 85.3 88.4 - 84.4 %   Platelets 205 150 - 400 K/uL   nRBC 0.0 0.0 - 0.2 %   Neutrophils Relative % 48 %   Neutro Abs 2.3 1.7 - 7.7 K/uL   Lymphocytes Relative 37 %   Lymphs Abs 1.9 0.7 - 4.0 K/uL   Monocytes Relative 13 %   Monocytes Absolute 0.6 0.1 - 1.0 K/uL   Eosinophils Relative 2 %   Eosinophils Absolute 0.1 0.0 - 0.5 K/uL   Basophils Relative 0 %   Basophils Absolute 0.0 0.0 - 0.1 K/uL   Immature Granulocytes 0 %   Abs Immature Granulocytes 0.01 0.00 - 0.07 K/uL    Comment: Performed at [facility], [address], [place], [place] [address]  Troponin I (High Sensitivity)     Status: None   Collection Time: 11/26/23  2:05 AM  Result Value Ref Range   Troponin I (High Sensitivity) 4 <18 ng/L    Comment: (NOTE) Elevated high sensitivity troponin I (hsTnI) values and significant  changes across serial measurements may suggest ACS but many other  chronic and acute conditions are known to elevate hsTnI results.  Refer to the Links section for chest pain algorithms and additional  guidance. Performed at [facility], [address] [place], [place] [address]   TSH     Status: None   Collection Time: 11/26/23  2:05 AM  Result Value Ref Range   TSH 0.764 0.350 - 4.500 uIU/mL    Comment: Performed by a 3rd Generation assay with a functional sensitivity of <=0.01 uIU/mL. Performed at [facility], [address], [place], [place] [address]   Urine Culture (for pregnant, neutropenic or urologic patients or patients with an indwelling urinary catheter)     Status: Abnormal   Collection Time: 11/26/23  2:05 AM   Specimen: Urine, Clean Catch  Result Value Ref Range   Specimen Description      URINE, CLEAN CATCH Performed at [facility], [address],  [place], [place] [address]    Special Requests      NONE Performed at [facility], [address], [place], [place] [address]    Culture >=100,000 COLONIES/mL KLEBSIELLA PNEUMONIAE (A)    Report Status 11/28/2023 FINAL    Organism ID, Bacteria KLEBSIELLA PNEUMONIAE (A)       Susceptibility   Klebsiella pneumoniae - MIC*    AMPICILLIN RESISTANT Resistant     CEFAZOLIN <=4 SENSITIVE Sensitive     CEFEPIME <=0.12 SENSITIVE Sensitive  CEFTRIAXONE  <=0.25 SENSITIVE Sensitive     CIPROFLOXACIN <=0.25 SENSITIVE Sensitive     GENTAMICIN <=1 SENSITIVE Sensitive     IMIPENEM <=0.25 SENSITIVE Sensitive     NITROFURANTOIN 64 INTERMEDIATE Intermediate     TRIMETH/SULFA <=20 SENSITIVE Sensitive     AMPICILLIN/SULBACTAM 4 SENSITIVE Sensitive     PIP/TAZO <=4 SENSITIVE Sensitive ug/mL    * >=100,000 COLONIES/mL KLEBSIELLA PNEUMONIAE  Urinalysis, Routine w reflex microscopic -Urine, Clean Catch     Status: Abnormal   Collection Time: 11/26/23  3:45 AM  Result Value Ref Range   Color, Urine YELLOW (A) YELLOW   APPearance HAZY (A) CLEAR   Specific Gravity, Urine 1.016 1.005 - 1.030   pH 7.0 5.0 - 8.0   Glucose, UA NEGATIVE NEGATIVE mg/dL   Hgb urine dipstick NEGATIVE NEGATIVE   Bilirubin Urine NEGATIVE NEGATIVE   Ketones, ur NEGATIVE NEGATIVE mg/dL   Protein, ur NEGATIVE NEGATIVE mg/dL   Nitrite NEGATIVE NEGATIVE   Leukocytes,Ua SMALL (A) NEGATIVE   RBC / HPF 0-5 0 - 5 RBC/hpf   WBC, UA 6-10 0 - 5 WBC/hpf   Bacteria, UA MANY (A) NONE SEEN   Squamous Epithelial / HPF 0-5 0 - 5 /HPF   Mucus PRESENT     Comment: Performed at [facility], [address] [place], [place] [address]  Troponin I (High Sensitivity)     Status: None   Collection Time: 11/26/23  5:13 AM  Result Value Ref Range   Troponin I (High Sensitivity) 4 <18 ng/L    Comment: (NOTE) Elevated high sensitivity troponin I (hsTnI) values and significant  changes across serial measurements may suggest ACS but  many other  chronic and acute conditions are known to elevate hsTnI results.  Refer to the Links section for chest pain algorithms and additional  guidance. Performed at [facility], [address] [place], [place] [address]   Lipid panel     Status: None   Collection Time: 11/26/23  5:13 AM  Result Value Ref Range   Cholesterol 135 0 - 200 mg/dL   Triglycerides 57 <849 mg/dL   HDL 42 >59 mg/dL   Total CHOL/HDL Ratio 3.2 RATIO   VLDL 11 0 - 40 mg/dL   LDL Cholesterol 82 0 - 99 mg/dL    Comment:        Total Cholesterol/HDL:CHD Risk Coronary Heart Disease Risk Table                     Men   Women  1/2 Average Risk   3.4   3.3  Average Risk       5.0   4.4  2 X Average Risk   9.6   7.1  3 X Average Risk  23.4   11.0        Use the calculated Patient Ratio above and the CHD Risk Table to determine the patient's CHD Risk.        ATP III CLASSIFICATION (LDL):  <100     mg/dL   Optimal  899-870  mg/dL   Near or Above                    Optimal  130-159  mg/dL   Borderline  839-810  mg/dL   High  >809     mg/dL   Very High Performed at [facility], [address] [place], [place] [address]   ECHOCARDIOGRAM COMPLETE     Status:  None   Collection Time: 11/26/23  1:59 PM  Result Value Ref Range   Weight 2,720 oz   Height 62 in   BP 194/88 mmHg   S' Lateral 3.00 cm   Area-P 1/2 4.29 cm2   AV Area VTI 2.45 cm2   AR max vel 2.33 cm2   AV Area mean vel 2.18 cm2   Est EF 60 - 65%    Ao pk vel 1.28 m/s   AV Peak grad 6.6 mmHg   AV Mean grad 3.4 mmHg  CBC     Status: Abnormal   Collection Time: 11/29/23  3:33 AM  Result Value Ref Range   WBC 4.9 4.0 - 10.5 K/uL   RBC 4.59 3.87 - 5.11 MIL/uL   Hemoglobin 11.2 (L) 12.0 - 15.0 g/dL   HCT 65.3 (L) 63.9 - 53.9 %   MCV 75.4 (L) 80.0 - 100.0 fL   MCH 24.4 (L) 26.0 - 34.0 pg   MCHC 32.4 30.0 - 36.0 g/dL   RDW 85.8 88.4 - 84.4 %   Platelets 202 150 - 400 K/uL   nRBC 0.0 0.0 - 0.2 %    Comment:  Performed at [facility], [address] [place], [place] [address]  Basic metabolic panel with GFR     Status: Abnormal   Collection Time: 11/29/23  3:33 AM  Result Value Ref Range   Sodium 141 135 - 145 mmol/L   Potassium 3.2 (L) 3.5 - 5.1 mmol/L   Chloride 103 98 - 111 mmol/L   CO2 28 22 - 32 mmol/L   Glucose, Bld 100 (H) 70 - 99 mg/dL    Comment: Glucose reference range applies only to samples taken after fasting for at least 8 hours.   BUN 16 8 - 23 mg/dL   Creatinine, Ser 9.13 0.44 - 1.00 mg/dL   Calcium 9.3 8.9 - 89.6 mg/dL   GFR, Estimated >39 >39 mL/min    Comment: (NOTE) Calculated using the CKD-EPI Creatinine Equation (2021)    Anion gap 10 5 - 15    Comment: Performed at [facility], [address] [place], [place] [address]  Comprehensive metabolic panel     Status: Abnormal   Collection Time: 12/07/23 11:30 PM  Result Value Ref Range   Sodium 130 (L) 135 - 145 mmol/L   Potassium 3.4 (L) 3.5 - 5.1 mmol/L   Chloride 90 (L) 98 - 111 mmol/L   CO2 28 22 - 32 mmol/L   Glucose, Bld 112 (H) 70 - 99 mg/dL    Comment: Glucose reference range applies only to samples taken after fasting for at least 8 hours.   BUN 30 (H) 8 - 23 mg/dL   Creatinine, Ser 9.17 0.44 - 1.00 mg/dL   Calcium 9.2 8.9 - 89.6 mg/dL   Total Protein 8.2 (H) 6.5 - 8.1 g/dL   Albumin 3.9 3.5 - 5.0 g/dL   AST 27 15 - 41 U/L   ALT 22 0 - 44 U/L   Alkaline Phosphatase 71 38 - 126 U/L   Total Bilirubin 1.1 0.0 - 1.2 mg/dL   GFR, Estimated >39 >39 mL/min    Comment: (NOTE) Calculated using the CKD-EPI Creatinine Equation (2021)    Anion gap 12 5 - 15    Comment: Performed at [facility], [address], [place], [place] [address]  CBC with Differential     Status: Abnormal   Collection Time: 12/07/23 11:30 PM  Result Value Ref Range  WBC 6.8 4.0 - 10.5 K/uL   RBC 4.60 3.87 - 5.11 MIL/uL   Hemoglobin 11.5 (L) 12.0 - 15.0 g/dL   HCT 65.4 (L) 63.9 - 53.9 %   MCV 75.0  (L) 80.0 - 100.0 fL   MCH 25.0 (L) 26.0 - 34.0 pg   MCHC 33.3 30.0 - 36.0 g/dL   RDW 86.3 88.4 - 84.4 %   Platelets 198 150 - 400 K/uL   nRBC 0.0 0.0 - 0.2 %   Neutrophils Relative % 65 %   Neutro Abs 4.4 1.7 - 7.7 K/uL   Lymphocytes Relative 25 %   Lymphs Abs 1.7 0.7 - 4.0 K/uL   Monocytes Relative 8 %   Monocytes Absolute 0.6 0.1 - 1.0 K/uL   Eosinophils Relative 2 %   Eosinophils Absolute 0.1 0.0 - 0.5 K/uL   Basophils Relative 0 %   Basophils Absolute 0.0 0.0 - 0.1 K/uL   Immature Granulocytes 0 %   Abs Immature Granulocytes 0.02 0.00 - 0.07 K/uL    Comment: Performed at [facility], [address] [place], [place] [address]  Type and screen [facility]     Status: None   Collection Time: 12/07/23 11:30 PM  Result Value Ref Range   ABO/RH(D) O NEG    Antibody Screen NEG    Sample Expiration      12/10/2023,2359 Performed at [facility], [address] [place], [place] [address]   Protime-INR     Status: None   Collection Time: 12/07/23 11:30 PM  Result Value Ref Range   Prothrombin Time 14.5 11.4 - 15.2 seconds   INR 1.1 0.8 - 1.2    Comment: (NOTE) INR goal varies based on device and disease states. Performed at [facility], [address] [place], [place] [address]   Allergen Profile, Mold     Status: None   Collection Time: 12/26/23 11:29 AM  Result Value Ref Range   Class Description Allergens Comment     Comment:     Levels of Specific IgE       Class  Description of Class     ---------------------------  -----  --------------------                    < 0.10         0         Negative            0.10 -    0.31         0/I       Equivocal/Low            0.32 -    0.55         I         Low            0.56 -    1.40         II        Moderate            1.41 -    3.90         III       High            3.91 -   19.00         IV        Very High           19.01 -  100.00  V         Very High                    >100.00         VI        Very High    Penicillium Chrysogen IgE <0.10 Class 0 kU/L   Cladosporium Herbarum IgE <0.10 Class 0 kU/L   Aspergillus Fumigatus IgE <0.10 Class 0 kU/L   Mucor Racemosus IgE <0.10 Class 0 kU/L   Candida Albicans IgE <0.10 Class 0 kU/L   Alternaria Alternata IgE <0.10 Class 0 kU/L   Setomelanomma Rostrat <0.10 Class 0 kU/L   [id number]-[id number] Fusarium proliferatum <0.10 Class 0 kU/L   Stemphylium Herbarum IgE <0.10 Class 0 kU/L   Aureobasidi Pullulans IgE <0.10 Class 0 kU/L   Phoma Betae IgE <0.10 Class 0 kU/L   M014-[id number] Epicoccum purpur <0.10 Class 0 kU/L      Assessment & Plan:  Continue current medications.  Monitor blood pressure.  Await second opinion with neurosurgery. Problem List Items Addressed This Visit     Mixed hyperlipidemia   Essential hypertension, benign   Cervical myelopathy (HCC) - Primary   Spinal stenosis of cervical region   Other Visit Diagnoses       ASD (atrial septal defect)           Return in about 1 month (around 02/09/2024).   Total time spent: 30 minutes  FERNAND FREDY RAMAN, MD  01/10/2024   This document may have been prepared by [facility] Voice Recognition software and as such may include unintentional dictation errors.

## 2024-01-13 DIAGNOSIS — S2690XD Unspecified injury of heart, unspecified with or without hemopericardium, subsequent encounter: Secondary | ICD-10-CM | POA: Diagnosis not present

## 2024-01-13 DIAGNOSIS — H8113 Benign paroxysmal vertigo, bilateral: Secondary | ICD-10-CM | POA: Diagnosis not present

## 2024-01-13 DIAGNOSIS — G9589 Other specified diseases of spinal cord: Secondary | ICD-10-CM | POA: Diagnosis not present

## 2024-01-13 DIAGNOSIS — Z556 Problems related to health literacy: Secondary | ICD-10-CM | POA: Diagnosis not present

## 2024-01-13 DIAGNOSIS — K5900 Constipation, unspecified: Secondary | ICD-10-CM | POA: Diagnosis not present

## 2024-01-13 DIAGNOSIS — Z9181 History of falling: Secondary | ICD-10-CM | POA: Diagnosis not present

## 2024-01-13 DIAGNOSIS — Z7982 Long term (current) use of aspirin: Secondary | ICD-10-CM | POA: Diagnosis not present

## 2024-01-13 DIAGNOSIS — D649 Anemia, unspecified: Secondary | ICD-10-CM | POA: Diagnosis not present

## 2024-01-13 DIAGNOSIS — E854 Organ-limited amyloidosis: Secondary | ICD-10-CM | POA: Diagnosis not present

## 2024-01-13 DIAGNOSIS — M179 Osteoarthritis of knee, unspecified: Secondary | ICD-10-CM | POA: Diagnosis not present

## 2024-01-13 DIAGNOSIS — M4802 Spinal stenosis, cervical region: Secondary | ICD-10-CM | POA: Diagnosis not present

## 2024-01-13 DIAGNOSIS — I651 Occlusion and stenosis of basilar artery: Secondary | ICD-10-CM | POA: Diagnosis not present

## 2024-01-13 DIAGNOSIS — Z79899 Other long term (current) drug therapy: Secondary | ICD-10-CM | POA: Diagnosis not present

## 2024-01-13 DIAGNOSIS — Z791 Long term (current) use of non-steroidal anti-inflammatories (NSAID): Secondary | ICD-10-CM | POA: Diagnosis not present

## 2024-01-13 DIAGNOSIS — Q211 Atrial septal defect, unspecified: Secondary | ICD-10-CM | POA: Diagnosis not present

## 2024-01-13 DIAGNOSIS — I10 Essential (primary) hypertension: Secondary | ICD-10-CM | POA: Diagnosis not present

## 2024-01-21 DIAGNOSIS — Z7982 Long term (current) use of aspirin: Secondary | ICD-10-CM | POA: Diagnosis not present

## 2024-01-21 DIAGNOSIS — G9589 Other specified diseases of spinal cord: Secondary | ICD-10-CM | POA: Diagnosis not present

## 2024-01-21 DIAGNOSIS — E854 Organ-limited amyloidosis: Secondary | ICD-10-CM | POA: Diagnosis not present

## 2024-01-21 DIAGNOSIS — Z9181 History of falling: Secondary | ICD-10-CM | POA: Diagnosis not present

## 2024-01-21 DIAGNOSIS — M179 Osteoarthritis of knee, unspecified: Secondary | ICD-10-CM | POA: Diagnosis not present

## 2024-01-21 DIAGNOSIS — Q211 Atrial septal defect, unspecified: Secondary | ICD-10-CM | POA: Diagnosis not present

## 2024-01-21 DIAGNOSIS — S2690XD Unspecified injury of heart, unspecified with or without hemopericardium, subsequent encounter: Secondary | ICD-10-CM | POA: Diagnosis not present

## 2024-01-21 DIAGNOSIS — Z79899 Other long term (current) drug therapy: Secondary | ICD-10-CM | POA: Diagnosis not present

## 2024-01-21 DIAGNOSIS — M4802 Spinal stenosis, cervical region: Secondary | ICD-10-CM | POA: Diagnosis not present

## 2024-01-21 DIAGNOSIS — K5900 Constipation, unspecified: Secondary | ICD-10-CM | POA: Diagnosis not present

## 2024-01-21 DIAGNOSIS — Z556 Problems related to health literacy: Secondary | ICD-10-CM | POA: Diagnosis not present

## 2024-01-21 DIAGNOSIS — I651 Occlusion and stenosis of basilar artery: Secondary | ICD-10-CM | POA: Diagnosis not present

## 2024-01-21 DIAGNOSIS — I10 Essential (primary) hypertension: Secondary | ICD-10-CM | POA: Diagnosis not present

## 2024-01-21 DIAGNOSIS — H8113 Benign paroxysmal vertigo, bilateral: Secondary | ICD-10-CM | POA: Diagnosis not present

## 2024-01-21 DIAGNOSIS — D649 Anemia, unspecified: Secondary | ICD-10-CM | POA: Diagnosis not present

## 2024-01-21 DIAGNOSIS — Z791 Long term (current) use of non-steroidal anti-inflammatories (NSAID): Secondary | ICD-10-CM | POA: Diagnosis not present

## 2024-01-24 DIAGNOSIS — H401131 Primary open-angle glaucoma, bilateral, mild stage: Secondary | ICD-10-CM | POA: Diagnosis not present

## 2024-01-24 DIAGNOSIS — I6389 Other cerebral infarction: Secondary | ICD-10-CM

## 2024-01-29 DIAGNOSIS — Z9181 History of falling: Secondary | ICD-10-CM | POA: Diagnosis not present

## 2024-01-29 DIAGNOSIS — S2690XD Unspecified injury of heart, unspecified with or without hemopericardium, subsequent encounter: Secondary | ICD-10-CM | POA: Diagnosis not present

## 2024-01-29 DIAGNOSIS — G9589 Other specified diseases of spinal cord: Secondary | ICD-10-CM | POA: Diagnosis not present

## 2024-01-29 DIAGNOSIS — M179 Osteoarthritis of knee, unspecified: Secondary | ICD-10-CM | POA: Diagnosis not present

## 2024-01-29 DIAGNOSIS — K5900 Constipation, unspecified: Secondary | ICD-10-CM | POA: Diagnosis not present

## 2024-01-29 DIAGNOSIS — D649 Anemia, unspecified: Secondary | ICD-10-CM | POA: Diagnosis not present

## 2024-01-29 DIAGNOSIS — Q211 Atrial septal defect, unspecified: Secondary | ICD-10-CM | POA: Diagnosis not present

## 2024-01-29 DIAGNOSIS — I10 Essential (primary) hypertension: Secondary | ICD-10-CM | POA: Diagnosis not present

## 2024-01-29 DIAGNOSIS — E854 Organ-limited amyloidosis: Secondary | ICD-10-CM | POA: Diagnosis not present

## 2024-01-29 DIAGNOSIS — Z7982 Long term (current) use of aspirin: Secondary | ICD-10-CM | POA: Diagnosis not present

## 2024-01-29 DIAGNOSIS — H8113 Benign paroxysmal vertigo, bilateral: Secondary | ICD-10-CM | POA: Diagnosis not present

## 2024-01-29 DIAGNOSIS — Z79899 Other long term (current) drug therapy: Secondary | ICD-10-CM | POA: Diagnosis not present

## 2024-01-29 DIAGNOSIS — I651 Occlusion and stenosis of basilar artery: Secondary | ICD-10-CM | POA: Diagnosis not present

## 2024-01-29 DIAGNOSIS — M4802 Spinal stenosis, cervical region: Secondary | ICD-10-CM | POA: Diagnosis not present

## 2024-01-29 DIAGNOSIS — Z556 Problems related to health literacy: Secondary | ICD-10-CM | POA: Diagnosis not present

## 2024-01-29 DIAGNOSIS — Z791 Long term (current) use of non-steroidal anti-inflammatories (NSAID): Secondary | ICD-10-CM | POA: Diagnosis not present

## 2024-01-30 DIAGNOSIS — H401131 Primary open-angle glaucoma, bilateral, mild stage: Secondary | ICD-10-CM | POA: Diagnosis not present

## 2024-01-30 DIAGNOSIS — Z961 Presence of intraocular lens: Secondary | ICD-10-CM | POA: Diagnosis not present

## 2024-01-30 DIAGNOSIS — H43813 Vitreous degeneration, bilateral: Secondary | ICD-10-CM | POA: Diagnosis not present

## 2024-02-07 ENCOUNTER — Encounter: Payer: Self-pay | Admitting: Cardiovascular Disease

## 2024-02-07 ENCOUNTER — Ambulatory Visit: Admitting: Cardiovascular Disease

## 2024-02-07 VITALS — BP 98/58 | HR 63 | Ht 62.0 in | Wt 166.0 lb

## 2024-02-07 DIAGNOSIS — I10 Essential (primary) hypertension: Secondary | ICD-10-CM

## 2024-02-07 DIAGNOSIS — I4711 Inappropriate sinus tachycardia, so stated: Secondary | ICD-10-CM

## 2024-02-07 DIAGNOSIS — I6312 Cerebral infarction due to embolism of basilar artery: Secondary | ICD-10-CM

## 2024-02-07 DIAGNOSIS — E782 Mixed hyperlipidemia: Secondary | ICD-10-CM

## 2024-02-07 MED ORDER — LOSARTAN POTASSIUM 25 MG PO TABS: 25.0000 mg | ORAL_TABLET | Freq: Every day | ORAL | 1 refills | Status: AC

## 2024-02-07 MED ORDER — METOPROLOL SUCCINATE ER 25 MG PO TB24: 25.0000 mg | ORAL_TABLET | Freq: Every day | ORAL | 11 refills | Status: AC

## 2024-02-07 NOTE — Progress Notes (Signed)
 Cardiology Office Note   Date:  02/07/2024   ID:  [name], [name] 1935/09/23, MRN [id number]  PCP:  Fernand Fredy RAMAN, MD  Cardiologist:  Denyse Fernand, MD      History of Present Illness: Tammy Hendricks is a 88 y.o. female who presents for  Chief Complaint  Patient presents with   Follow-up    1 month holter results      Doing fine.      Past Medical History:  Diagnosis Date   Arthritis    right knee   Hypertension    Pre-diabetes    Sciatica    left side - occasional   Vertigo    sporadic   Wears dentures    partial upper, full lower     Past Surgical History:  Procedure Laterality Date   ABDOMINAL HYSTERECTOMY     APPENDECTOMY     CATARACT EXTRACTION W/PHACO Right 11/09/2015   Procedure: CATARACT EXTRACTION PHACO AND INTRAOCULAR LENS PLACEMENT (IOC) right eye;  Surgeon: Dene Etienne, MD;  Location: [facility];  Service: Ophthalmology;  Laterality: Right;   CATARACT EXTRACTION W/PHACO Left 12/03/2017   Procedure: CATARACT EXTRACTION PHACO AND INTRAOCULAR LENS PLACEMENT (IOC) LEFT EYE;  Surgeon: Etienne Dene, MD;  Location: [facility];  Service: Ophthalmology;  Laterality: Left;   CHOLECYSTECTOMY     COLONOSCOPY     OOPHORECTOMY     TONSILLECTOMY       Current Outpatient Medications  Medication Sig Dispense Refill   acetaminophen  (TYLENOL ) 650 MG CR tablet Take 650 mg by mouth every 8 (eight) hours as needed for pain.     aspirin  EC 81 MG tablet Take 81 mg by mouth daily. Swallow whole.     brimonidine  (ALPHAGAN ) 0.2 % ophthalmic solution Place 1 drop into both eyes 2 (two) times daily.     fluticasone  (FLONASE ) 50 MCG/ACT nasal spray Place 1 spray into both nostrils daily. 15.8 mL 2   hydrochlorothiazide  (HYDRODIURIL ) 25 MG tablet TAKE 1 TABLET BY MOUTH ONCE  DAILY 100 tablet 2   Krill Oil 500 MG CAPS Take 1 capsule by mouth daily.     latanoprost (XALATAN) 0.005 % ophthalmic solution Place 1 drop into both  eyes at bedtime.     losartan  (COZAAR ) 25 MG tablet Take 1 tablet (25 mg total) by mouth daily. 90 tablet 1   meloxicam  (MOBIC ) 15 MG tablet Take 1 tablet (15 mg total) by mouth daily. 30 tablet 3   metoprolol succinate (TOPROL XL) 25 MG 24 hr tablet Take 1 tablet (25 mg total) by mouth daily. 30 tablet 11   pantoprazole  (PROTONIX ) 40 MG tablet Take 1 tablet (40 mg total) by mouth daily. 90 tablet 2   polyethylene glycol (MIRALAX / GLYCOLAX) 17 g packet Take 17 g by mouth as needed for mild constipation.     potassium chloride  SA (KLOR-CON  M) 20 MEQ tablet Take 1 tablet (20 mEq total) by mouth daily. 100 tablet 2   timolol  (TIMOPTIC ) 0.5 % ophthalmic solution      No current facility-administered medications for this visit.    Allergies:   Patient has no known allergies.    Social History:   reports that she has never smoked. She has never used smokeless tobacco. She reports that she does not drink alcohol. No history on file for drug use.   Family History:  family history includes Dementia in her mother; Hypertension in her father.    ROS:  Review of Systems  Constitutional: Negative.   HENT: Negative.    Eyes: Negative.   Respiratory: Negative.    Gastrointestinal: Negative.   Genitourinary: Negative.   Musculoskeletal: Negative.   Skin: Negative.   Neurological: Negative.   Endo/Heme/Allergies: Negative.   Psychiatric/Behavioral: Negative.    All other systems reviewed and are negative.     All other systems are reviewed and negative.    PHYSICAL EXAM: VS:  BP (!) 98/58   Pulse 63   Ht 5' 2 (1.575 m)   Wt 166 lb (75.3 kg)   SpO2 97%   BMI 30.36 kg/m  , BMI Body mass index is 30.36 kg/m. Last weight:  Wt Readings from Last 3 Encounters:  02/07/24 166 lb (75.3 kg)  01/10/24 172 lb 9.6 oz (78.3 kg)  01/03/24 172 lb 3.2 oz (78.1 kg)     Physical Exam Constitutional:      Appearance: Normal appearance.  Cardiovascular:     Rate and Rhythm: Normal rate  and regular rhythm.     Heart sounds: Normal heart sounds.  Pulmonary:     Effort: Pulmonary effort is normal.     Breath sounds: Normal breath sounds.  Musculoskeletal:     Right lower leg: No edema.     Left lower leg: No edema.  Neurological:     Mental Status: She is alert.       EKG:   Recent Labs: 11/26/2023: TSH 0.764 12/07/2023: ALT 22; BUN 30; Creatinine, Ser 0.82; Hemoglobin 11.5; Platelets 198; Potassium 3.4; Sodium 130    Lipid Panel    Component Value Date/Time   CHOL 135 11/26/2023 0513   CHOL 164 07/05/2023 1538   TRIG 57 11/26/2023 0513   HDL 42 11/26/2023 0513   HDL 55 07/05/2023 1538   CHOLHDL 3.2 11/26/2023 0513   VLDL 11 11/26/2023 0513   LDLCALC 82 11/26/2023 0513   LDLCALC 92 07/05/2023 1538      Other studies Reviewed: Additional studies/ records that were reviewed today include:  Review of the above records demonstrates:       No data to display            ASSESSMENT AND PLAN:    ICD-10-CM   1. Essential hypertension, benign  I10 losartan  (COZAAR ) 25 MG tablet    metoprolol succinate (TOPROL XL) 25 MG 24 hr tablet   BP lw decrease losartan  from 50 to 25 mg daily and add metoprolol 25.    2. Mixed hyperlipidemia  E78.2 losartan  (COZAAR ) 25 MG tablet    metoprolol succinate (TOPROL XL) 25 MG 24 hr tablet    3. 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 Holter no atrial fib. episodes of sinus tachy 145/min    4. Inappropriate sinus tachycardia  I47.11 losartan  (COZAAR ) 25 MG tablet    metoprolol succinate (TOPROL XL) 25 MG 24 hr tablet   Holter showed  NSR, with episodes of sinus tachycardia 145/min. No atrial fibrillation.  Will add metorolol 25.       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 Holter no atrial fib. episodes of sinus tachy 145/min   Relevant Medications   losartan  (COZAAR ) 25 MG tablet   metoprolol succinate (TOPROL XL) 25 MG 24 hr tablet     Inappropriate sinus tachycardia       Holter showed  NSR, with episodes of sinus tachycardia 145/min. No atrial fibrillation.  Will add metorolol 25.   Relevant Medications   losartan  (COZAAR ) 25 MG tablet   metoprolol succinate (TOPROL XL) 25 MG 24 hr tablet          Disposition:   Return in about 4 weeks (around 03/06/2024).    Total time spent: 40 minutes  Signed,  Denyse Bathe, MD  02/07/2024 11:43 AM    [facility]

## 2024-02-14 ENCOUNTER — Ambulatory Visit: Admitting: Internal Medicine

## 2024-02-21 ENCOUNTER — Encounter: Payer: Self-pay | Admitting: Internal Medicine

## 2024-02-21 ENCOUNTER — Ambulatory Visit: Admitting: Internal Medicine

## 2024-02-21 VITALS — BP 110/60 | HR 56 | Ht 62.0 in | Wt 167.0 lb

## 2024-02-21 DIAGNOSIS — H401130 Primary open-angle glaucoma, bilateral, stage unspecified: Secondary | ICD-10-CM | POA: Diagnosis not present

## 2024-02-21 DIAGNOSIS — E782 Mixed hyperlipidemia: Secondary | ICD-10-CM

## 2024-02-21 DIAGNOSIS — G959 Disease of spinal cord, unspecified: Secondary | ICD-10-CM

## 2024-02-21 DIAGNOSIS — I10 Essential (primary) hypertension: Secondary | ICD-10-CM

## 2024-02-21 NOTE — Progress Notes (Signed)
 Established Patient Office Visit  Subjective:  Patient ID: Tammy Hendricks, female    DOB: February 09, 1936  Age: 88 y.o. MRN: [id number]  Chief Complaint  Patient presents with   Follow-up    1 month follow up    Patient is here today for follow up. She reports she has not heard anything from neurology referral that was sent by hospital in August. She reports her cervical pain has resolved since her last appointment. She states she has been more active and that has seemed to help. She would still like a second opinion. Will send neurology referral to [facility] at patients request. Encouraged patient to reach out if she does not hear anything in the next 1-2 weeks.  She has no new complaints today. Will check blood work today.    No other concerns at this time.   Past Medical History:  Diagnosis Date   Arthritis    right knee   Hypertension    Pre-diabetes    Sciatica    left side - occasional   Vertigo    sporadic   Wears dentures    partial upper, full lower    Past Surgical History:  Procedure Laterality Date   ABDOMINAL HYSTERECTOMY     APPENDECTOMY     CATARACT EXTRACTION W/PHACO Right 11/09/2015   Procedure: CATARACT EXTRACTION PHACO AND INTRAOCULAR LENS PLACEMENT (IOC) right eye;  Surgeon: Dene Etienne, MD;  Location: [facility];  Service: Ophthalmology;  Laterality: Right;   CATARACT EXTRACTION W/PHACO Left 12/03/2017   Procedure: CATARACT EXTRACTION PHACO AND INTRAOCULAR LENS PLACEMENT (IOC) LEFT EYE;  Surgeon: Etienne Dene, MD;  Location: [facility];  Service: Ophthalmology;  Laterality: Left;   CHOLECYSTECTOMY     COLONOSCOPY     OOPHORECTOMY     TONSILLECTOMY      Social History   Socioeconomic History   Marital status: Widowed    Spouse name: Not on file   Number of children: Not on file   Years of education: Not on file   Highest education level: Not on file  Occupational History   Not on file  Tobacco Use   Smoking  status: Never   Smokeless tobacco: Never  Vaping Use   Vaping status: Never Used  Substance and Sexual Activity   Alcohol use: No   Drug use: Not on file   Sexual activity: Not on file  Other Topics Concern   Not on file  Social History Narrative   Not on file   Social Drivers of Health   Financial Resource Strain: Low Risk  (04/02/2023)   Overall Financial Resource Strain (CARDIA)    Difficulty of Paying Living Expenses: Not very hard  Food Insecurity: No Food Insecurity (11/26/2023)   Hunger Vital Sign    Worried About Running Out of Food in the Last Year: Never true    Ran Out of Food in the Last Year: Never true  Transportation Needs: No Transportation Needs (11/26/2023)   PRAPARE - Administrator, Civil Service (Medical): No    Lack of Transportation (Non-Medical): No  Physical Activity: Not on file  Stress: Not on file  Social Connections: Unknown (11/26/2023)   Social Connection and Isolation Panel    Frequency of Communication with Friends and Family: Twice a week    Frequency of Social Gatherings with Friends and Family: Patient declined    Attends Religious Services: Patient declined    Database Administrator or Organizations: Patient declined  Attends Banker Meetings: Patient declined    Marital Status: Widowed  Intimate Partner Violence: Not At Risk (11/27/2023)   Humiliation, Afraid, Rape, and Kick questionnaire    Fear of Current or Ex-Partner: No    Emotionally Abused: No    Physically Abused: No    Sexually Abused: No    Family History  Problem Relation Age of Onset   Dementia Mother    Hypertension Father     No Known Allergies  Outpatient Medications Prior to Visit  Medication Sig   acetaminophen  (TYLENOL ) 650 MG CR tablet Take 650 mg by mouth every 8 (eight) hours as needed for pain.   aspirin  EC 81 MG tablet Take 81 mg by mouth daily. Swallow whole.   brimonidine  (ALPHAGAN ) 0.2 % ophthalmic solution Place 1 drop into both  eyes 2 (two) times daily.   fluticasone  (FLONASE ) 50 MCG/ACT nasal spray Place 1 spray into both nostrils daily.   hydrochlorothiazide  (HYDRODIURIL ) 25 MG tablet TAKE 1 TABLET BY MOUTH ONCE  DAILY   Krill Oil 500 MG CAPS Take 1 capsule by mouth daily.   latanoprost (XALATAN) 0.005 % ophthalmic solution Place 1 drop into both eyes at bedtime.   losartan  (COZAAR ) 25 MG tablet Take 1 tablet (25 mg total) by mouth daily.   meloxicam  (MOBIC ) 15 MG tablet Take 1 tablet (15 mg total) by mouth daily.   metoprolol succinate (TOPROL XL) 25 MG 24 hr tablet Take 1 tablet (25 mg total) by mouth daily.   pantoprazole  (PROTONIX ) 40 MG tablet Take 1 tablet (40 mg total) by mouth daily.   polyethylene glycol (MIRALAX / GLYCOLAX) 17 g packet Take 17 g by mouth as needed for mild constipation.   potassium chloride  SA (KLOR-CON  M) 20 MEQ tablet Take 1 tablet (20 mEq total) by mouth daily.   timolol  (TIMOPTIC ) 0.5 % ophthalmic solution    No facility-administered medications prior to visit.    Review of Systems  Constitutional: Negative.  Negative for chills, fever and malaise/fatigue.  HENT: Negative.  Negative for congestion and sore throat.   Eyes: Negative.  Negative for blurred vision and pain.  Respiratory: Negative.  Negative for cough and shortness of breath.   Cardiovascular: Negative.  Negative for chest pain, palpitations and leg swelling.  Gastrointestinal: Negative.  Negative for abdominal pain, blood in stool, constipation, diarrhea, heartburn, melena, nausea and vomiting.  Genitourinary: Negative.  Negative for dysuria, flank pain, frequency and urgency.  Musculoskeletal: Negative.  Negative for joint pain and myalgias.  Skin: Negative.   Neurological: Negative.  Negative for dizziness, tingling, sensory change, weakness and headaches.  Endo/Heme/Allergies: Negative.   Psychiatric/Behavioral: Negative.  Negative for depression and suicidal ideas. The patient is not nervous/anxious.         Objective:   BP 110/60   Pulse (!) 56   Ht 5' 2 (1.575 m)   Wt 167 lb (75.8 kg)   SpO2 99%   BMI 30.54 kg/m   Vitals:   02/21/24 1139  BP: 110/60  Pulse: (!) 56  Height: 5' 2 (1.575 m)  Weight: 167 lb (75.8 kg)  SpO2: 99%  BMI (Calculated): 30.54    Physical Exam Vitals and nursing note reviewed.  Constitutional:      Appearance: Normal appearance.  HENT:     Head: Normocephalic and atraumatic.     Nose: Nose normal.     Mouth/Throat:     Mouth: Mucous membranes are moist.     Pharynx: Oropharynx is clear.  Eyes:     Conjunctiva/sclera: Conjunctivae normal.     Pupils: Pupils are equal, round, and reactive to light.  Cardiovascular:     Rate and Rhythm: Normal rate and regular rhythm.     Pulses: Normal pulses.     Heart sounds: Normal heart sounds. No murmur heard. Pulmonary:     Effort: Pulmonary effort is normal.     Breath sounds: Normal breath sounds. No wheezing.  Abdominal:     General: Bowel sounds are normal.     Palpations: Abdomen is soft.     Tenderness: There is no abdominal tenderness. There is no right CVA tenderness or left CVA tenderness.  Musculoskeletal:        General: Normal range of motion.     Cervical back: Normal range of motion.     Right lower leg: No edema.     Left lower leg: No edema.  Skin:    General: Skin is warm and dry.  Neurological:     General: No focal deficit present.     Mental Status: She is alert and oriented to person, place, and time.  Psychiatric:        Mood and Affect: Mood normal.        Behavior: Behavior normal.      No results found for any visits on 02/21/24.  Recent Results (from the past 2160 hours)  Protime-INR     Status: None   Collection Time: 11/25/23  6:17 PM  Result Value Ref Range   Prothrombin Time 14.9 11.4 - 15.2 seconds   INR 1.1 0.8 - 1.2    Comment: (NOTE) INR goal varies based on device and disease states. Performed at [facility], [address]  [place], [place] [address]   APTT     Status: None   Collection Time: 11/25/23  6:17 PM  Result Value Ref Range   aPTT 27 24 - 36 seconds    Comment: Performed at [facility], [address] [place], [place] [address]  Comprehensive metabolic panel     Status: Abnormal   Collection Time: 11/25/23  6:17 PM  Result Value Ref Range   Sodium 139 135 - 145 mmol/L   Potassium 3.3 (L) 3.5 - 5.1 mmol/L   Chloride 101 98 - 111 mmol/L   CO2 26 22 - 32 mmol/L   Glucose, Bld 75 70 - 99 mg/dL    Comment: Glucose reference range applies only to samples taken after fasting for at least 8 hours.   BUN 29 (H) 8 - 23 mg/dL   Creatinine, Ser 8.93 (H) 0.44 - 1.00 mg/dL   Calcium 9.0 8.9 - 89.6 mg/dL   Total Protein 7.2 6.5 - 8.1 g/dL   Albumin 3.4 (L) 3.5 - 5.0 g/dL   AST 22 15 - 41 U/L   ALT 18 0 - 44 U/L   Alkaline Phosphatase 69 38 - 126 U/L   Total Bilirubin 0.7 0.0 - 1.2 mg/dL   GFR, Estimated 51 (L) >60 mL/min    Comment: (NOTE) Calculated using the CKD-EPI Creatinine Equation (2021)    Anion gap 12 5 - 15    Comment: Performed at [facility], [address] [place], [place] [address]  Ethanol     Status: None   Collection Time: 11/25/23  6:17 PM  Result Value Ref Range   Alcohol, Ethyl (B) <15 <15 mg/dL    Comment: (NOTE) For medical purposes only. Performed at [facility],  [address], [place], [place] [address]   CBC with Differential/Platelet     Status: Abnormal   Collection Time: 11/25/23  6:17 PM  Result Value Ref Range   WBC 5.0 4.0 - 10.5 K/uL   RBC 4.28 3.87 - 5.11 MIL/uL   Hemoglobin 10.4 (L) 12.0 - 15.0 g/dL   HCT 67.4 (L) 63.9 - 53.9 %   MCV 75.9 (L) 80.0 - 100.0 fL   MCH 24.3 (L) 26.0 - 34.0 pg   MCHC 32.0 30.0 - 36.0 g/dL   RDW 85.3 88.4 - 84.4 %   Platelets 205 150 - 400 K/uL   nRBC 0.0 0.0 - 0.2 %   Neutrophils Relative % 48 %   Neutro Abs 2.3 1.7 - 7.7 K/uL   Lymphocytes Relative 37 %   Lymphs Abs 1.9 0.7 - 4.0 K/uL    Monocytes Relative 13 %   Monocytes Absolute 0.6 0.1 - 1.0 K/uL   Eosinophils Relative 2 %   Eosinophils Absolute 0.1 0.0 - 0.5 K/uL   Basophils Relative 0 %   Basophils Absolute 0.0 0.0 - 0.1 K/uL   Immature Granulocytes 0 %   Abs Immature Granulocytes 0.01 0.00 - 0.07 K/uL    Comment: Performed at [facility], [address], [place], [place] [address]  Troponin I (High Sensitivity)     Status: None   Collection Time: 11/26/23  2:05 AM  Result Value Ref Range   Troponin I (High Sensitivity) 4 <18 ng/L    Comment: (NOTE) Elevated high sensitivity troponin I (hsTnI) values and significant  changes across serial measurements may suggest ACS but many other  chronic and acute conditions are known to elevate hsTnI results.  Refer to the Links section for chest pain algorithms and additional  guidance. Performed at [facility], [address] [place], [place] [address]   TSH     Status: None   Collection Time: 11/26/23  2:05 AM  Result Value Ref Range   TSH 0.764 0.350 - 4.500 uIU/mL    Comment: Performed by a 3rd Generation assay with a functional sensitivity of <=0.01 uIU/mL. Performed at [facility], [address], [place], [place] [address]   Urine Culture (for pregnant, neutropenic or urologic patients or patients with an indwelling urinary catheter)     Status: Abnormal   Collection Time: 11/26/23  2:05 AM   Specimen: Urine, Clean Catch  Result Value Ref Range   Specimen Description      URINE, CLEAN CATCH Performed at [facility], [address], [place], [place] [address]    Special Requests      NONE Performed at [facility], [address], [place], [place] [address]    Culture >=100,000 COLONIES/mL KLEBSIELLA PNEUMONIAE (A)    Report Status 11/28/2023 FINAL    Organism ID, Bacteria KLEBSIELLA PNEUMONIAE (A)       Susceptibility   Klebsiella pneumoniae - MIC*    AMPICILLIN RESISTANT Resistant     CEFAZOLIN  <=4 SENSITIVE Sensitive     CEFEPIME <=0.12 SENSITIVE Sensitive     CEFTRIAXONE  <=0.25 SENSITIVE Sensitive     CIPROFLOXACIN <=0.25 SENSITIVE Sensitive     GENTAMICIN <=1 SENSITIVE Sensitive     IMIPENEM <=0.25 SENSITIVE Sensitive     NITROFURANTOIN 64 INTERMEDIATE Intermediate     TRIMETH/SULFA <=20 SENSITIVE Sensitive     AMPICILLIN/SULBACTAM 4 SENSITIVE Sensitive     PIP/TAZO <=4 SENSITIVE Sensitive ug/mL    * >=100,000 COLONIES/mL KLEBSIELLA  PNEUMONIAE  Urinalysis, Routine w reflex microscopic -Urine, Clean Catch     Status: Abnormal   Collection Time: 11/26/23  3:45 AM  Result Value Ref Range   Color, Urine YELLOW (A) YELLOW   APPearance HAZY (A) CLEAR   Specific Gravity, Urine 1.016 1.005 - 1.030   pH 7.0 5.0 - 8.0   Glucose, UA NEGATIVE NEGATIVE mg/dL   Hgb urine dipstick NEGATIVE NEGATIVE   Bilirubin Urine NEGATIVE NEGATIVE   Ketones, ur NEGATIVE NEGATIVE mg/dL   Protein, ur NEGATIVE NEGATIVE mg/dL   Nitrite NEGATIVE NEGATIVE   Leukocytes,Ua SMALL (A) NEGATIVE   RBC / HPF 0-5 0 - 5 RBC/hpf   WBC, UA 6-10 0 - 5 WBC/hpf   Bacteria, UA MANY (A) NONE SEEN   Squamous Epithelial / HPF 0-5 0 - 5 /HPF   Mucus PRESENT     Comment: Performed at [facility], [address] [place], [place] [address]  Troponin I (High Sensitivity)     Status: None   Collection Time: 11/26/23  5:13 AM  Result Value Ref Range   Troponin I (High Sensitivity) 4 <18 ng/L    Comment: (NOTE) Elevated high sensitivity troponin I (hsTnI) values and significant  changes across serial measurements may suggest ACS but many other  chronic and acute conditions are known to elevate hsTnI results.  Refer to the Links section for chest pain algorithms and additional  guidance. Performed at [facility], [address] [place], [place] [address]   Lipid panel     Status: None   Collection Time: 11/26/23  5:13 AM  Result Value Ref Range   Cholesterol 135 0 - 200 mg/dL   Triglycerides  57 <849 mg/dL   HDL 42 >59 mg/dL   Total CHOL/HDL Ratio 3.2 RATIO   VLDL 11 0 - 40 mg/dL   LDL Cholesterol 82 0 - 99 mg/dL    Comment:        Total Cholesterol/HDL:CHD Risk Coronary Heart Disease Risk Table                     Men   Women  1/2 Average Risk   3.4   3.3  Average Risk       5.0   4.4  2 X Average Risk   9.6   7.1  3 X Average Risk  23.4   11.0        Use the calculated Patient Ratio above and the CHD Risk Table to determine the patient's CHD Risk.        ATP III CLASSIFICATION (LDL):  <100     mg/dL   Optimal  899-870  mg/dL   Near or Above                    Optimal  130-159  mg/dL   Borderline  839-810  mg/dL   High  >809     mg/dL   Very High Performed at [facility], [address] [place], [place] [address]   ECHOCARDIOGRAM COMPLETE     Status: None   Collection Time: 11/26/23  1:59 PM  Result Value Ref Range   Weight 2,720 oz   Height 62 in   BP 194/88 mmHg   S' Lateral 3.00 cm   Area-P 1/2 4.29 cm2   AV Area VTI 2.45 cm2   AR max vel 2.33 cm2   AV Area mean vel 2.18 cm2   Est EF 60 -  65%    Ao pk vel 1.28 m/s   AV Peak grad 6.6 mmHg   AV Mean grad 3.4 mmHg  CBC     Status: Abnormal   Collection Time: 11/29/23  3:33 AM  Result Value Ref Range   WBC 4.9 4.0 - 10.5 K/uL   RBC 4.59 3.87 - 5.11 MIL/uL   Hemoglobin 11.2 (L) 12.0 - 15.0 g/dL   HCT 65.3 (L) 63.9 - 53.9 %   MCV 75.4 (L) 80.0 - 100.0 fL   MCH 24.4 (L) 26.0 - 34.0 pg   MCHC 32.4 30.0 - 36.0 g/dL   RDW 85.8 88.4 - 84.4 %   Platelets 202 150 - 400 K/uL   nRBC 0.0 0.0 - 0.2 %    Comment: Performed at [facility], [address] [place], [place] [address]  Basic metabolic panel with GFR     Status: Abnormal   Collection Time: 11/29/23  3:33 AM  Result Value Ref Range   Sodium 141 135 - 145 mmol/L   Potassium 3.2 (L) 3.5 - 5.1 mmol/L   Chloride 103 98 - 111 mmol/L   CO2 28 22 - 32 mmol/L   Glucose, Bld 100 (H) 70 - 99 mg/dL    Comment: Glucose reference  range applies only to samples taken after fasting for at least 8 hours.   BUN 16 8 - 23 mg/dL   Creatinine, Ser 9.13 0.44 - 1.00 mg/dL   Calcium 9.3 8.9 - 89.6 mg/dL   GFR, Estimated >39 >39 mL/min    Comment: (NOTE) Calculated using the CKD-EPI Creatinine Equation (2021)    Anion gap 10 5 - 15    Comment: Performed at [facility], [address] [place], [place] [address]  Comprehensive metabolic panel     Status: Abnormal   Collection Time: 12/07/23 11:30 PM  Result Value Ref Range   Sodium 130 (L) 135 - 145 mmol/L   Potassium 3.4 (L) 3.5 - 5.1 mmol/L   Chloride 90 (L) 98 - 111 mmol/L   CO2 28 22 - 32 mmol/L   Glucose, Bld 112 (H) 70 - 99 mg/dL    Comment: Glucose reference range applies only to samples taken after fasting for at least 8 hours.   BUN 30 (H) 8 - 23 mg/dL   Creatinine, Ser 9.17 0.44 - 1.00 mg/dL   Calcium 9.2 8.9 - 89.6 mg/dL   Total Protein 8.2 (H) 6.5 - 8.1 g/dL   Albumin 3.9 3.5 - 5.0 g/dL   AST 27 15 - 41 U/L   ALT 22 0 - 44 U/L   Alkaline Phosphatase 71 38 - 126 U/L   Total Bilirubin 1.1 0.0 - 1.2 mg/dL   GFR, Estimated >39 >39 mL/min    Comment: (NOTE) Calculated using the CKD-EPI Creatinine Equation (2021)    Anion gap 12 5 - 15    Comment: Performed at [facility], [address] [place], [place] [address]  CBC with Differential     Status: Abnormal   Collection Time: 12/07/23 11:30 PM  Result Value Ref Range   WBC 6.8 4.0 - 10.5 K/uL   RBC 4.60 3.87 - 5.11 MIL/uL   Hemoglobin 11.5 (L) 12.0 - 15.0 g/dL   HCT 65.4 (L) 63.9 - 53.9 %   MCV 75.0 (L) 80.0 - 100.0 fL   MCH 25.0 (L) 26.0 - 34.0 pg   MCHC 33.3 30.0 - 36.0 g/dL   RDW 86.3 88.4 - 84.4 %  Platelets 198 150 - 400 K/uL   nRBC 0.0 0.0 - 0.2 %   Neutrophils Relative % 65 %   Neutro Abs 4.4 1.7 - 7.7 K/uL   Lymphocytes Relative 25 %   Lymphs Abs 1.7 0.7 - 4.0 K/uL   Monocytes Relative 8 %   Monocytes Absolute 0.6 0.1 - 1.0 K/uL   Eosinophils Relative 2 %    Eosinophils Absolute 0.1 0.0 - 0.5 K/uL   Basophils Relative 0 %   Basophils Absolute 0.0 0.0 - 0.1 K/uL   Immature Granulocytes 0 %   Abs Immature Granulocytes 0.02 0.00 - 0.07 K/uL    Comment: Performed at [facility], [address] [place], [place] [address]  Type and screen [facility]     Status: None   Collection Time: 12/07/23 11:30 PM  Result Value Ref Range   ABO/RH(D) O NEG    Antibody Screen NEG    Sample Expiration      12/10/2023,2359 Performed at [facility], [address] [place], [place] [address]   Protime-INR     Status: None   Collection Time: 12/07/23 11:30 PM  Result Value Ref Range   Prothrombin Time 14.5 11.4 - 15.2 seconds   INR 1.1 0.8 - 1.2    Comment: (NOTE) INR goal varies based on device and disease states. Performed at [facility], [address] [place], [place] [address]   Allergen Profile, Mold     Status: None   Collection Time: 12/26/23 11:29 AM  Result Value Ref Range   Class Description Allergens Comment     Comment:     Levels of Specific IgE       Class  Description of Class     ---------------------------  -----  --------------------                    < 0.10         0         Negative            0.10 -    0.31         0/I       Equivocal/Low            0.32 -    0.55         I         Low            0.56 -    1.40         II        Moderate            1.41 -    3.90         III       High            3.91 -   19.00         IV        Very High           19.01 -  100.00         V         Very High                   >100.00         VI        Very High    Penicillium Chrysogen IgE <0.10 Class 0 kU/L   Cladosporium Herbarum  IgE <0.10 Class 0 kU/L   Aspergillus Fumigatus IgE <0.10 Class 0 kU/L   Mucor Racemosus IgE <0.10 Class 0 kU/L   Candida Albicans IgE <0.10 Class 0 kU/L   Alternaria Alternata IgE <0.10 Class 0 kU/L   Setomelanomma Rostrat <0.10 Class 0 kU/L   [id number]  Fusarium proliferatum <0.10 Class 0 kU/L   Stemphylium Herbarum IgE <0.10 Class 0 kU/L   Aureobasidi Pullulans IgE <0.10 Class 0 kU/L   Phoma Betae IgE <0.10 Class 0 kU/L   [id number] Epicoccum purpur <0.10 Class 0 kU/L      Assessment & Plan:  Continue taking medications as prescribed. Reinforced healthy diet and exercise as tolerated. Second opinion neurology referral sent. Check blood work today and FU with patient on results, Problem List Items Addressed This Visit     Mixed hyperlipidemia   Essential hypertension, benign   Relevant Orders   Basic metabolic panel with GFR   Primary open angle glaucoma (POAG) of both eyes   Cervical myelopathy (HCC) - Primary   Relevant Orders   Ambulatory referral to Neurology    Return in about 2 months (around 04/22/2024).   Total time spent: 20 minutes. This time includes review of previous notes and results and patient face to face interaction during today's visit.    FERNAND FREDY RAMAN, MD  02/21/2024   This document may have been prepared by [facility] Voice Recognition software and as such may include unintentional dictation errors.

## 2024-02-23 ENCOUNTER — Other Ambulatory Visit: Payer: Self-pay | Admitting: Internal Medicine

## 2024-02-23 DIAGNOSIS — E876 Hypokalemia: Secondary | ICD-10-CM

## 2024-02-28 ENCOUNTER — Other Ambulatory Visit: Payer: Self-pay | Admitting: Internal Medicine

## 2024-03-06 ENCOUNTER — Ambulatory Visit: Admitting: Cardiovascular Disease

## 2024-04-21 ENCOUNTER — Ambulatory Visit: Admitting: Internal Medicine

## 2024-05-01 ENCOUNTER — Ambulatory Visit: Payer: Medicare (Managed Care) | Admitting: Internal Medicine

## 2024-05-01 ENCOUNTER — Encounter: Payer: Self-pay | Admitting: Internal Medicine

## 2024-05-01 VITALS — BP 128/66 | HR 70 | Ht 62.0 in | Wt 161.2 lb

## 2024-05-01 DIAGNOSIS — Z6829 Body mass index (BMI) 29.0-29.9, adult: Secondary | ICD-10-CM | POA: Diagnosis not present

## 2024-05-01 DIAGNOSIS — E663 Overweight: Secondary | ICD-10-CM | POA: Diagnosis not present

## 2024-05-01 DIAGNOSIS — E782 Mixed hyperlipidemia: Secondary | ICD-10-CM | POA: Diagnosis not present

## 2024-05-01 DIAGNOSIS — G959 Disease of spinal cord, unspecified: Secondary | ICD-10-CM

## 2024-05-01 DIAGNOSIS — I10 Essential (primary) hypertension: Secondary | ICD-10-CM | POA: Diagnosis not present

## 2024-05-01 DIAGNOSIS — R5383 Other fatigue: Secondary | ICD-10-CM | POA: Diagnosis not present

## 2024-05-01 DIAGNOSIS — Q211 Atrial septal defect, unspecified: Secondary | ICD-10-CM | POA: Insufficient documentation

## 2024-05-01 DIAGNOSIS — R7303 Prediabetes: Secondary | ICD-10-CM

## 2024-05-01 DIAGNOSIS — H401130 Primary open-angle glaucoma, bilateral, stage unspecified: Secondary | ICD-10-CM | POA: Diagnosis not present

## 2024-05-01 NOTE — Progress Notes (Signed)
 "  Established Patient Office Visit  Subjective:  Patient ID: Tammy Hendricks, female    DOB: [birth year]  Age: 89 y.o. MRN: [id number]  Chief Complaint  Patient presents with   Follow-up    3 month follow up    Patient is here today for follow up. She reports feeling overall well today and denies any concerns at this time.  She still reports no cervical pain at this time but wants to see [facility] neurology for the second opinion. Will provide her with their contact information to make appointment as referral was sent 01/2024. FU is needed with her Cardiologist as she has not seen them since 01/2024. Patient had abnormal echo 11/2023 that showed ASD that was not visualized during echo 07/2023. She has previously had Holter monitor and had intermittent sinus tachycardia and was started on low dose Metoprolol  at that time. She reports taking her medications as prescribed without side effects. Today her BP and pulse are within normal range.  Patient is due for routine blood work but is not fasting. She will return next week for blood work to be completed. She states feeling well and denies any chest pain, shortness of breath, palpitations, abdominal distress at this time.     No other concerns at this time.   Past Medical History:  Diagnosis Date   Arthritis    right knee   Hypertension    Pre-diabetes    Sciatica    left side - occasional   Vertigo    sporadic   Wears dentures    partial upper, full lower    Past Surgical History:  Procedure Laterality Date   ABDOMINAL HYSTERECTOMY     APPENDECTOMY     CATARACT EXTRACTION W/PHACO Right 11/09/2015   Procedure: CATARACT EXTRACTION PHACO AND INTRAOCULAR LENS PLACEMENT (IOC) right eye;  Surgeon: Dene Etienne, MD;  Location: [facility];  Service: Ophthalmology;  Laterality: Right;   CATARACT EXTRACTION W/PHACO Left 12/03/2017   Procedure: CATARACT EXTRACTION PHACO AND INTRAOCULAR LENS PLACEMENT (IOC) LEFT EYE;  Surgeon:  Etienne Dene, MD;  Location: [facility];  Service: Ophthalmology;  Laterality: Left;   CHOLECYSTECTOMY     COLONOSCOPY     OOPHORECTOMY     TONSILLECTOMY      Social History   Socioeconomic History   Marital status: Widowed    Spouse name: Not on file   Number of children: Not on file   Years of education: Not on file   Highest education level: Not on file  Occupational History   Not on file  Tobacco Use   Smoking status: Never   Smokeless tobacco: Never  Vaping Use   Vaping status: Never Used  Substance and Sexual Activity   Alcohol use: No   Drug use: Not on file   Sexual activity: Not on file  Other Topics Concern   Not on file  Social History Narrative   Not on file   Social Drivers of Health   Tobacco Use: Low Risk (05/01/2024)   Patient History    Smoking Tobacco Use: Never    Smokeless Tobacco Use: Never    Passive Exposure: Not on file  Financial Resource Strain: Low Risk (04/02/2023)   Overall Financial Resource Strain (CARDIA)    Difficulty of Paying Living Expenses: Not very hard  Food Insecurity: No Food Insecurity (11/26/2023)   Epic    Worried About Running Out of Food in the Last Year: Never true    Ran Out of  Food in the Last Year: Never true  Transportation Needs: No Transportation Needs (11/26/2023)   Epic    Lack of Transportation (Medical): No    Lack of Transportation (Non-Medical): No  Physical Activity: Not on file  Stress: Not on file  Social Connections: Unknown (11/26/2023)   Social Connection and Isolation Panel    Frequency of Communication with Friends and Family: Twice a week    Frequency of Social Gatherings with Friends and Family: Patient declined    Attends Religious Services: Patient declined    Database Administrator or Organizations: Patient declined    Attends Banker Meetings: Patient declined    Marital Status: Widowed  Intimate Partner Violence: Not At Risk (11/27/2023)   Epic    Fear of Current  or Ex-Partner: No    Emotionally Abused: No    Physically Abused: No    Sexually Abused: No  Depression (PHQ2-9): Low Risk (04/02/2023)   Depression (PHQ2-9)    PHQ-2 Score: 0  Alcohol Screen: Low Risk (04/02/2023)   Alcohol Screen    Last Alcohol Screening Score (AUDIT): 0  Housing: Low Risk (11/26/2023)   Epic    Unable to Pay for Housing in the Last Year: No    Number of Times Moved in the Last Year: 0    Homeless in the Last Year: No  Utilities: Not At Risk (11/26/2023)   Epic    Threatened with loss of utilities: No  Health Literacy: Not on file    Family History  Problem Relation Age of Onset   Dementia Mother    Hypertension Father     Allergies[1]  Show/hide medication list[2]  Review of Systems  Constitutional: Negative.  Negative for chills, fever and malaise/fatigue.  HENT: Negative.  Negative for congestion and sore throat.   Eyes: Negative.  Negative for blurred vision and pain.  Respiratory: Negative.  Negative for cough and shortness of breath.   Cardiovascular: Negative.  Negative for chest pain, palpitations and leg swelling.  Gastrointestinal: Negative.  Negative for abdominal pain, blood in stool, constipation, diarrhea, heartburn, melena, nausea and vomiting.  Genitourinary: Negative.  Negative for dysuria, flank pain, frequency and urgency.  Musculoskeletal: Negative.  Negative for joint pain and myalgias.  Skin: Negative.   Neurological: Negative.  Negative for dizziness, tingling, sensory change, weakness and headaches.  Endo/Heme/Allergies: Negative.   Psychiatric/Behavioral: Negative.  Negative for depression and suicidal ideas. The patient is not nervous/anxious.        Objective:   BP 128/66   Pulse 70   Ht 5' 2 (1.575 m)   Wt 161 lb 3.2 oz (73.1 kg)   SpO2 97%   BMI 29.48 kg/m   Vitals:   05/01/24 1510  BP: 128/66  Pulse: 70  Height: 5' 2 (1.575 m)  Weight: 161 lb 3.2 oz (73.1 kg)  SpO2: 97%  BMI (Calculated): 29.48     Physical Exam Vitals and nursing note reviewed.  Constitutional:      Appearance: Normal appearance.  HENT:     Head: Normocephalic and atraumatic.     Nose: Nose normal.     Mouth/Throat:     Mouth: Mucous membranes are moist.     Pharynx: Oropharynx is clear.  Eyes:     Conjunctiva/sclera: Conjunctivae normal.     Pupils: Pupils are equal, round, and reactive to light.  Cardiovascular:     Rate and Rhythm: Normal rate and regular rhythm.     Pulses: Normal pulses.  Heart sounds: Normal heart sounds. No murmur heard. Pulmonary:     Effort: Pulmonary effort is normal.     Breath sounds: Normal breath sounds. No wheezing.  Abdominal:     General: Bowel sounds are normal.     Palpations: Abdomen is soft.     Tenderness: There is no abdominal tenderness. There is no right CVA tenderness or left CVA tenderness.  Musculoskeletal:        General: Normal range of motion.     Cervical back: Normal range of motion.     Right lower leg: No edema.     Left lower leg: No edema.  Skin:    General: Skin is warm and dry.  Neurological:     General: No focal deficit present.     Mental Status: She is alert and oriented to person, place, and time.  Psychiatric:        Mood and Affect: Mood normal.        Behavior: Behavior normal.      No results found for any visits on 05/01/24.  No results found for this or any previous visit (from the past 2160 hours).    Assessment & Plan:  Continue medications as prescribed. FU with Cardiologist. Provided patient with Neurologist contact information to schedule appointment. Keep working on eating healthy diet, Drink plenty of water daily and stay active. Keep other specialist appointments as scheduled. Problem List Items Addressed This Visit       Cardiovascular and Mediastinum   Essential hypertension, benign - Primary   Relevant Medications   losartan  (COZAAR ) 50 MG tablet   ASD (atrial septal defect)   Relevant Medications    losartan  (COZAAR ) 50 MG tablet     Digestive   Gastroesophageal reflux disease without esophagitis     Nervous and Auditory   Cervical myelopathy (HCC)     Other   Mixed hyperlipidemia   Relevant Medications   losartan  (COZAAR ) 50 MG tablet   Primary open angle glaucoma (POAG) of both eyes   Prediabetes   Other fatigue   Overweight with body mass index (BMI) of 29 to 29.9 in adult    Return in about 3 months (around 07/30/2024) for Medicare AWV.   Total time spent: 25 minutes. This time includes review of previous notes and results and patient face to face interaction during today's visit.    FERNAND FREDY RAMAN, MD  05/01/2024   This document may have been prepared by [facility] Voice Recognition software and as such may include unintentional dictation errors.      [1] No Known Allergies [2]  Outpatient Medications Prior to Visit  Medication Sig   acetaminophen  (TYLENOL ) 650 MG CR tablet Take 650 mg by mouth every 8 (eight) hours as needed for pain.   aspirin  EC 81 MG tablet Take 81 mg by mouth daily. Swallow whole.   brimonidine  (ALPHAGAN ) 0.2 % ophthalmic solution Place 1 drop into both eyes 2 (two) times daily.   hydrochlorothiazide  (HYDRODIURIL ) 25 MG tablet TAKE 1 TABLET BY MOUTH ONCE  DAILY   Krill Oil 500 MG CAPS Take 1 capsule by mouth daily.   latanoprost (XALATAN) 0.005 % ophthalmic solution Place 1 drop into both eyes at bedtime.   losartan  (COZAAR ) 25 MG tablet Take 1 tablet (25 mg total) by mouth daily.   losartan  (COZAAR ) 50 MG tablet Take 50 mg by mouth 2 (two) times daily.   meloxicam  (MOBIC ) 15 MG tablet TAKE 1 TABLET (15 MG TOTAL) BY MOUTH  DAILY.   metoprolol  succinate (TOPROL  XL) 25 MG 24 hr tablet Take 1 tablet (25 mg total) by mouth daily.   pantoprazole  (PROTONIX ) 40 MG tablet Take 1 tablet (40 mg total) by mouth daily.   polyethylene glycol (MIRALAX / GLYCOLAX) 17 g packet Take 17 g by mouth as needed for mild constipation.   potassium chloride  SA (KLOR-CON   M) 20 MEQ tablet TAKE 1 TABLET BY MOUTH DAILY   timolol  (TIMOPTIC ) 0.5 % ophthalmic solution    fluticasone  (FLONASE ) 50 MCG/ACT nasal spray Place 1 spray into both nostrils daily. (Patient not taking: Reported on 05/01/2024)   No facility-administered medications prior to visit.   "

## 2024-05-06 ENCOUNTER — Telehealth: Payer: Self-pay | Admitting: Internal Medicine

## 2024-05-06 NOTE — Telephone Encounter (Signed)
 Patient's niece, Levon, left a VM stating they got a call from [facility] for a consultation and were told that it was going to be $800. They cannot afford this and would like to go somewhere else. She did not say what the consultation is for so we will have to call her and find out.

## 2024-05-25 ENCOUNTER — Other Ambulatory Visit: Payer: Self-pay | Admitting: Family

## 2024-08-06 ENCOUNTER — Ambulatory Visit: Payer: Medicare (Managed Care) | Admitting: Internal Medicine
